# Patient Record
Sex: Male | Born: 1960 | Race: White | Hispanic: No | Marital: Married | Smoking: Never smoker
Health system: Southern US, Community
[De-identification: ages and names within clinical notes are randomized; demographics above are authoritative.]

## PROBLEM LIST (undated history)

## (undated) DIAGNOSIS — Z87442 Personal history of urinary calculi: Secondary | ICD-10-CM

## (undated) DIAGNOSIS — C7910 Secondary malignant neoplasm of unspecified urinary organs: Secondary | ICD-10-CM

## (undated) HISTORY — PX: CHOLECYSTECTOMY: SHX55

---

## 1997-04-07 HISTORY — PX: LAPAROSCOPIC CHOLECYSTECTOMY: SUR755

## 2017-12-18 ENCOUNTER — Ambulatory Visit (INDEPENDENT_AMBULATORY_CARE_PROVIDER_SITE_OTHER): Payer: 59 | Admitting: Family Medicine

## 2017-12-18 ENCOUNTER — Telehealth: Payer: Self-pay | Admitting: Family Medicine

## 2017-12-18 ENCOUNTER — Encounter: Payer: Self-pay | Admitting: Family Medicine

## 2017-12-18 ENCOUNTER — Other Ambulatory Visit: Payer: Self-pay

## 2017-12-18 VITALS — BP 132/92 | HR 93 | Temp 98.1°F | Ht 72.64 in | Wt 258.8 lb

## 2017-12-18 DIAGNOSIS — M79604 Pain in right leg: Secondary | ICD-10-CM | POA: Diagnosis not present

## 2017-12-18 DIAGNOSIS — B351 Tinea unguium: Secondary | ICD-10-CM | POA: Diagnosis not present

## 2017-12-18 DIAGNOSIS — B353 Tinea pedis: Secondary | ICD-10-CM | POA: Diagnosis not present

## 2017-12-18 MED ORDER — VALACYCLOVIR HCL 1 G PO TABS: 1000.0000 mg | ORAL_TABLET | Freq: Three times a day (TID) | ORAL | 0 refills | Status: DC

## 2017-12-18 MED ORDER — EFINACONAZOLE 10 % EX SOLN: CUTANEOUS | 3 refills | Status: DC

## 2017-12-18 MED ORDER — TRAMADOL HCL 50 MG PO TABS: 50.0000 mg | ORAL_TABLET | Freq: Four times a day (QID) | ORAL | 0 refills | Status: DC | PRN

## 2017-12-18 NOTE — Patient Instructions (Addendum)
Although it is less likely that your leg pain is coming from shingles, can start Valtrex for now.  Pain may be related to radicular pain from your back, or possibly from hip.  Okay to try over-the-counter ibuprofen or your diclofenac (if blood pressure is less than 140/90) but I did write for tramadol if needed for more severe pain.  Recheck in the next 1 week to 10 days if that is not improving, sooner if worse.  Elevated blood pressure today may be related to pain, but I would like to recheck that in the next 1 week.  Monitor your blood pressures at home and if those are running over 140/90, please return sooner.  Over-the-counter clotrimazole for athlete's foot as needed.  Jublia topical solution for the fungal infection in the toenails.  See information below.  If you would like to use Lamisil oral medication instead, we will need to check some blood work and monitor liver tests every 6 weeks.  Let me know if that is something you would like to do.  Please follow-up for a physical in the next month so we can review previous elevated blood sugars or other chronic medical issues.  Thank you for coming in today.  Return to the clinic or go to the nearest emergency room if any of your symptoms worsen or new symptoms occur.   Athlete's Foot Athlete's foot (tinea pedis) is a fungal infection of the skin on the feet. It often occurs on the skin that is between or underneath the toes. It can also occur on the soles of the feet. The infection can spread from person to person (is contagious). What are the causes? Athlete's foot is caused by a fungus. This fungus grows in warm, moist places. Most people get athlete's foot by sharing shower stalls, towels, and wet floors with someone who is infected. Not washing your feet or changing your socks often enough can contribute to athlete's foot. What increases the risk? This condition is more likely to develop in:  Men.  People who have a weak body defense  system (immune system).  People who have diabetes.  People who use public showers, such as at a gym.  People who wear heavy-duty shoes, such as Environmental manager.  Seasons with warm, humid weather.  What are the signs or symptoms? Symptoms of this condition include:  Itchy areas between the toes or on the soles of the feet.  White, flaky, or scaly areas between the toes or on the soles of the feet.  Very itchy small blisters between the toes or on the soles of the feet.  Small cuts on the skin. These cuts can become infected.  Thick or discolored toenails.  How is this diagnosed? This condition is diagnosed with a medical history and physical exam. Your health care provider may also take a skin or toenail sample to be examined. How is this treated? Treatment for this condition includes antifungal medicines. These may be applied as powders, ointments, or creams. In severe cases, an oral antifungal medicine may be given. Follow these instructions at home:  Apply or take over-the-counter and prescription medicines only as told by your health care provider.  Keep all follow-up visits as told by your health care provider. This is important.  Do not scratch your feet.  Keep your feet dry: ? Wear cotton or wool socks. Change your socks every day or if they become wet. ? Wear shoes that allow air to circulate, such as  sandals or canvas tennis shoes.  Wash and dry your feet: ? Every day or as told by your health care provider. ? After exercising. ? Including the area between your toes.  Do not share towels, nail clippers, or other personal items that touch your feet with others.  If you have diabetes, keep your blood sugar under control. How is this prevented?  Do not share towels.  Wear sandals in wet areas, such as locker rooms and shared showers.  Keep your feet dry: ? Wear cotton or wool socks. Change your socks every day or if they become wet. ? Wear  shoes that allow air to circulate, such as sandals or canvas tennis shoes.  Wash and dry your feet after exercising. Pay attention to the area between your toes. Contact a health care provider if:  You have a fever.  You have swelling, soreness, warmth, or redness in your foot.  You are not getting better with treatment.  Your symptoms get worse.  You have new symptoms. This information is not intended to replace advice given to you by your health care provider. Make sure you discuss any questions you have with your health care provider. Document Released: 03/21/2000 Document Revised: 08/30/2015 Document Reviewed: 09/25/2014 Elsevier Interactive Patient Education  2018 [employer].  Fungal Nail Infection Fungal nail infection is a common fungal infection of the toenails or fingernails. This condition affects toenails more often than fingernails. More than one nail may be infected. The condition can be passed from person to person (is contagious). What are the causes? This condition is caused by a fungus. Several types of funguses can cause the infection. These funguses are common in moist and warm areas. If your hands or feet come into contact with the fungus, it may get into a crack in your fingernail or toenail and cause the infection. What increases the risk? The following factors may make you more likely to develop this condition:  Being male.  Having diabetes.  Being of older age.  Living with someone who has the fungus.  Walking barefoot in areas where the fungus thrives, such as showers or locker rooms.  Having poor circulation.  Wearing shoes and socks that cause your feet to sweat.  Having athlete's foot.  Having a nail injury or history of a recent nail surgery.  Having psoriasis.  Having a weak body defense system (immune system).  What are the signs or symptoms? Symptoms of this condition include:  A pale spot on the nail.  Thickening of the  nail.  A nail that becomes yellow or brown.  A brittle or ragged nail edge.  A crumbling nail.  A nail that has lifted away from the nail bed.  How is this diagnosed? This condition is diagnosed with a physical exam. Your health care provider may take a scraping or clipping from your nail to test for the fungus. How is this treated? Mild infections do not need treatment. If you have significant nail changes, treatment may include:  Oral antifungal medicines. You may need to take the medicine for several weeks or several months, and you may not see the results for a long time. These medicines can cause side effects. Ask your health care provider what problems to watch for.  Antifungal nail polish and nail cream. These may be used along with oral antifungal medicines.  Laser treatment of the nail.  Surgery to remove the nail. This may be needed for the most severe infections.  Treatment takes  a long time, and the infection may come back. Follow these instructions at home: Medicines  Take or apply over-the-counter and prescription medicines only as told by your health care provider.  Ask your health care provider about using over-the-counter mentholated ointment on your nails. Lifestyle   Do not share personal items, such as towels or nail clippers.  Trim your nails often.  Wash and dry your hands and feet every day.  Wear absorbent socks, and change your socks frequently.  Wear shoes that allow air to circulate, such as sandals or canvas tennis shoes. Throw out old shoes.  Wear rubber gloves if you are working with your hands in wet areas.  Do not walk barefoot in shower rooms or locker rooms.  Do not use a nail salon that does not use clean instruments.  Do not use artificial nails. General instructions  Keep all follow-up visits as told by your health care provider. This is important.  Use antifungal foot powder on your feet and in your shoes. Contact a health  care provider if: Your infection is not getting better or it is getting worse after several months. This information is not intended to replace advice given to you by your health care provider. Make sure you discuss any questions you have with your health care provider. Document Released: 03/21/2000 Document Revised: 08/30/2015 Document Reviewed: 09/25/2014 Elsevier Interactive Patient Education  [employer].    If you have lab work done today you will be contacted with your lab results within the next 2 weeks.  If you have not heard from [place] then please contact us. The fastest way to get your results is to register for My Chart.   IF you received an x-ray today, you will receive an invoice from [facility]. Please contact [facility] at [phone] with questions or concerns regarding your invoice.   IF you received labwork today, you will receive an invoice from [facility]. Please contact [facility] at [phone] with questions or concerns regarding your invoice.   Our billing staff will not be able to assist you with questions regarding bills from these companies.  You will be contacted with the lab results as soon as they are available. The fastest way to get your results is to activate your My Chart account. Instructions are located on the last page of this paperwork. If you have not heard from [place] regarding the results in 2 weeks, please contact this office.

## 2017-12-18 NOTE — Telephone Encounter (Signed)
Copied from [place] [id number]. Topic: Quick Communication - Rx Refill/Question >> Dec 18, 2017 10:50 AM Margot Ables wrote: Medication: Efinaconazole 10 % SOLN  - pt states the pharmacy told him PA is required and they sent a fax to Dr. Carlota Raspberry. Pt said there is another medication that if his blood pressure goes down he is supposed to take. He states it was a pain medication but he didn't see anything ordered for pain except tramadol. Please advise.  If additional meds need to be sent use pharmacy:  [facility] [place], [place]

## 2017-12-18 NOTE — Progress Notes (Signed)
Subjective:  By signing my name below, I, Edward [name], attest that this documentation has been prepared under the direction and in the presence of Edward Ray, MD. Electronically Signed: [name], [place]. 12/18/2017 , 9:50 AM .  Patient was seen in Room 3 .   Patient ID: Edward [name], male    DOB: 1961-01-07, 58 y.o.   MRN: [id number] Chief Complaint  Patient presents with  . Pain    thinks he may have shingles, pain is raditaing from the right side of hip to the knee and very sensitive   HPI Edward [name] is a 57 y.o. male  Patient is here for right upper leg pain. He states he noticed skin hypersensitivity over his right upper leg initially. He felt some throbbing pain from his right groin, down into his right medial knee with throbbing pain over the front of his thigh. He denies any pain past his right knee. He mentions waking up the other night due to throbbing and pulsating pain. He's tried using the elliptical, with some improvement of his pain temporary. He states he didn't see a rash, but his wife noticed some bumps over the area. He has had some pain moving the right hip itself; flexion of his right leg he has some hip pain. He's received injections in his lumbar spine in the past around 2003-2004. He was recommended surgery at the time, but he preferred to have injection route first. He had MRI done with disc herniation at the time. He denies history of hernia. He's been taking ibuprofen 800 mg at night, but early this morning, took an additional diclofenac at 4:30 AM this morning due to too much pain.   He also mentions having toe fungus in his right foot. When hs exercised recently, he used OTC and other home remedies with some improvement. He states it's been present for about 35 years. Picture on phone showed irritated skin between the great and 2nd toe with some erythema.   Patient denies history of HTN in the past, though elevated BP on triage today. His BP usually  runs around 130/80s. He also mentions [name] sugars usually run around 100s-110s. He states having [name] work with Margit Banda on Oct 17th. He's also going to travel to [place] in Nov.   There are no active problems to display for this patient.  No past medical history on file.  Not on File Prior to Admission medications   Not on File   Social History   Socioeconomic History  . Marital status: Married    Spouse name: Not on file  . Number of children: Not on file  . Years of education: Not on file  . Highest education level: Not on file  Occupational History  . Not on file  Social Needs  . Financial resource strain: Not on file  . Food insecurity:    Worry: Not on file    Inability: Not on file  . Transportation needs:    Medical: Not on file    Non-medical: Not on file  Tobacco Use  . Smoking status: Never Smoker  . Smokeless tobacco: Never Used  Substance and Sexual Activity  . Alcohol use: Never    Frequency: Never  . Drug use: Never  . Sexual activity: Yes  Lifestyle  . Physical activity:    Days per week: Not on file    Minutes per session: Not on file  . Stress: Not on file  Relationships  . Social connections:  Talks on phone: Not on file    Gets together: Not on file    Attends religious service: Not on file    Active member of club or organization: Not on file    Attends meetings of clubs or organizations: Not on file    Relationship status: Not on file  . Intimate partner violence:    Fear of current or ex partner: Not on file    Emotionally abused: Not on file    Physically abused: Not on file    Forced sexual activity: Not on file  Other Topics Concern  . Not on file  Social History Narrative  . Not on file   Review of Systems  Constitutional: Negative for fatigue and unexpected weight change.  Eyes: Negative for visual disturbance.  Respiratory: Negative for cough, chest tightness and shortness of breath.   Cardiovascular: Negative for chest pain,  palpitations and leg swelling.  Gastrointestinal: Negative for abdominal pain and [name] in stool.  Musculoskeletal: Positive for arthralgias and myalgias. Negative for back pain and joint swelling.  Skin:       Skin hyperesthesia over right upper leg; toenail fungus of right foot  Neurological: Negative for dizziness, light-headedness and headaches.       Objective:   Physical Exam  Constitutional: He is oriented to person, place, and time. He appears well-developed and well-nourished. No distress.  HENT:  Head: Normocephalic and atraumatic.  Eyes: Pupils are equal, round, and reactive to light. EOM are normal.  Neck: Neck supple.  Cardiovascular: Normal rate.  Pulmonary/Chest: Effort normal. No respiratory distress.  Abdominal: Hernia confirmed negative in the right inguinal area and confirmed negative in the left inguinal area.  Genitourinary:  Genitourinary Comments: No obvious lymphadenopathy in the right inguinal fold, no appreciable hernia  Musculoskeletal: Normal range of motion.  Slight discomfort at the inguinal fold with active hip flexion, but pain free internal and external rotation of his right hip; L-spine non tender, L-spine pain free ROM  Lymphadenopathy: No inguinal adenopathy noted on the right side.  Neurological: He is alert and oriented to person, place, and time.  Skin: Skin is warm and dry.  Right foot: thickened toe nails of great, 2nd, 3rd and 5th toes, interdigital skin intact, slight skin scaling currently of dorsum foot, but no skin breakdown currently Right thigh and right leg there's no rash or skin changes  Psychiatric: He has a normal mood and affect. His behavior is normal.  Nursing note and vitals reviewed.   Vitals:   12/18/17 0914 12/18/17 0954  BP: (!) 160/90 (!) 132/92  Pulse: 93   Temp: 98.1 F (36.7 C)   TempSrc: Oral   SpO2: 95%   Weight: 258 lb 12.8 oz (117.4 kg)   Height: 6' 0.64" (1.845 m)        Assessment & Plan:  Edward [name] is a 57 y.o. male Right leg pain - Plan: valACYclovir (VALTREX) 1000 MG tablet, traMADol (ULTRAM) 50 MG tablet  -No rash seen, not specific to one dermatome, but possible dysesthesias or superficial pain.  Less likely shingles but in differential.  Pain with movement but then noted improved with some exercise. Iindicates possible musculoskeletal cause, including radicular pain from back versus right hip.  Did not have significant pain with hip testing.  No appreciable hernia.  -Initial treatment with Valtrex to cover for possible shingles, NSAID with ibuprofen or diclofenac at home as long as his [name] pressure remains below 140/90, and tramadol as needed for  more severe pain.  -Recheck in 1 week if not improving, sooner if worse.  Onychomycosis - Plan: Efinaconazole 10 % SOLN  - options discussed including topical vs oral and need for lft monitoring - chose Jublia. Timing of treatment and long term resolution discussed.   Tinea pedis of right foot  - improving with home treatment. Clotrimazole otc if needed, RTC precautions given.   Meds ordered this encounter  Medications  . Efinaconazole 10 % SOLN    Sig: Apply topically to affected nails QD for 48 weeks.    Dispense:  4 mL    Refill:  3  . valACYclovir (VALTREX) 1000 MG tablet    Sig: Take 1 tablet (1,000 mg total) by mouth 3 (three) times daily.    Dispense:  21 tablet    Refill:  0  . traMADol (ULTRAM) 50 MG tablet    Sig: Take 1 tablet (50 mg total) by mouth every 6 (six) hours as needed.    Dispense:  20 tablet    Refill:  0   Patient Instructions   Although it is less likely that your leg pain is coming from shingles, can start Valtrex for now.  Pain may be related to radicular pain from your back, or possibly from hip.  Okay to try over-the-counter ibuprofen or your diclofenac (if [name] pressure is less than 140/90) but I did write for tramadol if needed for more severe pain.  Recheck in the next 1 week to 10 days if  that is not improving, sooner if worse.  Elevated [name] pressure today may be related to pain, but I would like to recheck that in the next 1 week.  Monitor your [name] pressures at home and if those are running over 140/90, please return sooner.  Over-the-counter clotrimazole for athlete's foot as needed.  Jublia topical solution for the fungal infection in the toenails.  See information below.  If you would like to use Lamisil oral medication instead, we will need to check some [name] work and monitor liver tests every 6 weeks.  Let me know if that is something you would like to do.  Please follow-up for a physical in the next month so we can review previous elevated [name] sugars or other chronic medical issues.  Thank you for coming in today.  Return to the clinic or go to the nearest emergency room if any of your symptoms worsen or new symptoms occur.   Athlete's Foot Athlete's foot (tinea pedis) is a fungal infection of the skin on the feet. It often occurs on the skin that is between or underneath the toes. It can also occur on the soles of the feet. The infection can spread from person to person (is contagious). What are the causes? Athlete's foot is caused by a fungus. This fungus grows in warm, moist places. Most people get athlete's foot by sharing shower stalls, towels, and wet floors with someone who is infected. Not washing your feet or changing your socks often enough can contribute to athlete's foot. What increases the risk? This condition is more likely to develop in:  Men.  People who have a weak body defense system (immune system).  People who have diabetes.  People who use public showers, such as at a gym.  People who wear heavy-duty shoes, such as Environmental manager.  Seasons with warm, humid weather.  What are the signs or symptoms? Symptoms of this condition include:  Itchy areas between the toes or on  the soles of the feet.  White, flaky, or scaly  areas between the toes or on the soles of the feet.  Very itchy small blisters between the toes or on the soles of the feet.  Small cuts on the skin. These cuts can become infected.  Thick or discolored toenails.  How is this diagnosed? This condition is diagnosed with a medical history and physical exam. Your health care provider may also take a skin or toenail sample to be examined. How is this treated? Treatment for this condition includes antifungal medicines. These may be applied as powders, ointments, or creams. In severe cases, an oral antifungal medicine may be given. Follow these instructions at home:  Apply or take over-the-counter and prescription medicines only as told by your health care provider.  Keep all follow-up visits as told by your health care provider. This is important.  Do not scratch your feet.  Keep your feet dry: ? Wear cotton or wool socks. Change your socks every day or if they become wet. ? Wear shoes that allow air to circulate, such as sandals or canvas tennis shoes.  Wash and dry your feet: ? Every day or as told by your health care provider. ? After exercising. ? Including the area between your toes.  Do not share towels, nail clippers, or other personal items that touch your feet with others.  If you have diabetes, keep your [name] sugar under control. How is this prevented?  Do not share towels.  Wear sandals in wet areas, such as locker rooms and shared showers.  Keep your feet dry: ? Wear cotton or wool socks. Change your socks every day or if they become wet. ? Wear shoes that allow air to circulate, such as sandals or canvas tennis shoes.  Wash and dry your feet after exercising. Pay attention to the area between your toes. Contact a health care provider if:  You have a fever.  You have swelling, soreness, warmth, or redness in your foot.  You are not getting better with treatment.  Your symptoms get worse.  You have new  symptoms. This information is not intended to replace advice given to you by your health care provider. Make sure you discuss any questions you have with your health care provider. Document Released: 03/21/2000 Document Revised: 08/30/2015 Document Reviewed: 09/25/2014 Elsevier Interactive Patient Education  2018 [employer].  Fungal Nail Infection Fungal nail infection is a common fungal infection of the toenails or fingernails. This condition affects toenails more often than fingernails. More than one nail may be infected. The condition can be passed from person to person (is contagious). What are the causes? This condition is caused by a fungus. Several types of funguses can cause the infection. These funguses are common in moist and warm areas. If your hands or feet come into contact with the fungus, it may get into a crack in your fingernail or toenail and cause the infection. What increases the risk? The following factors may make you more likely to develop this condition:  Being male.  Having diabetes.  Being of older age.  Living with someone who has the fungus.  Walking barefoot in areas where the fungus thrives, such as showers or locker rooms.  Having poor circulation.  Wearing shoes and socks that cause your feet to sweat.  Having athlete's foot.  Having a nail injury or history of a recent nail surgery.  Having psoriasis.  Having a weak body defense system (immune system).  What  are the signs or symptoms? Symptoms of this condition include:  A pale spot on the nail.  Thickening of the nail.  A nail that becomes yellow or brown.  A brittle or ragged nail edge.  A crumbling nail.  A nail that has lifted away from the nail bed.  How is this diagnosed? This condition is diagnosed with a physical exam. Your health care provider may take a scraping or clipping from your nail to test for the fungus. How is this treated? Mild infections do not need  treatment. If you have significant nail changes, treatment may include:  Oral antifungal medicines. You may need to take the medicine for several weeks or several months, and you may not see the results for a long time. These medicines can cause side effects. Ask your health care provider what problems to watch for.  Antifungal nail polish and nail cream. These may be used along with oral antifungal medicines.  Laser treatment of the nail.  Surgery to remove the nail. This may be needed for the most severe infections.  Treatment takes a long time, and the infection may come back. Follow these instructions at home: Medicines  Take or apply over-the-counter and prescription medicines only as told by your health care provider.  Ask your health care provider about using over-the-counter mentholated ointment on your nails. Lifestyle   Do not share personal items, such as towels or nail clippers.  Trim your nails often.  Wash and dry your hands and feet every day.  Wear absorbent socks, and change your socks frequently.  Wear shoes that allow air to circulate, such as sandals or canvas tennis shoes. Throw out old shoes.  Wear rubber gloves if you are working with your hands in wet areas.  Do not walk barefoot in shower rooms or locker rooms.  Do not use a nail salon that does not use clean instruments.  Do not use artificial nails. General instructions  Keep all follow-up visits as told by your health care provider. This is important.  Use antifungal foot powder on your feet and in your shoes. Contact a health care provider if: Your infection is not getting better or it is getting worse after several months. This information is not intended to replace advice given to you by your health care provider. Make sure you discuss any questions you have with your health care provider. Document Released: 03/21/2000 Document Revised: 08/30/2015 Document Reviewed: 09/25/2014 Elsevier  Interactive Patient Education  [employer].    If you have lab work done today you will be contacted with your lab results within the next 2 weeks.  If you have not heard from [place] then please contact us. The fastest way to get your results is to register for My Chart.   IF you received an x-[name] today, you will receive an invoice from [facility]. Please contact [facility] at [phone] with questions or concerns regarding your invoice.   IF you received labwork today, you will receive an invoice from [facility]. Please contact [facility] at [phone] with questions or concerns regarding your invoice.   Our billing staff will not be able to assist you with questions regarding bills from these companies.  You will be contacted with the lab results as soon as they are available. The fastest way to get your results is to activate your My Chart account. Instructions are located on the last page of this paperwork. If you have not heard from [place] regarding the results  in 2 weeks, please contact this office.      I personally performed the services described in this documentation, which was scribed in my presence. The recorded information has been reviewed and considered for accuracy and completeness, addended by me as needed, and agree with information above.  Signed,   Edward Ray, MD [facility].  12/18/17 10:24 AM

## 2017-12-22 ENCOUNTER — Telehealth: Payer: Self-pay | Admitting: Family Medicine

## 2017-12-22 DIAGNOSIS — M79604 Pain in right leg: Secondary | ICD-10-CM

## 2017-12-22 NOTE — Telephone Encounter (Signed)
The medication Jublia was rejected by the insurance company, although it was rejected it still gives the option to attempt a PA. Well the insurance company is wanting to know if the patients treatment can be switched to a different formulary drug. I attached the drugs that are covered with the insurance company and placed in providers box at [place] at [address].

## 2017-12-23 NOTE — Telephone Encounter (Signed)
I will look for PA info for Jublia.  See patient instructions: "Okay to try over-the-counter ibuprofen or your diclofenac (if blood pressure is less than 140/90) but I did write for tramadol if needed for more severe pain"  Thanks.  -JG

## 2017-12-23 NOTE — Telephone Encounter (Signed)
Need PA and Dr. Carlota Raspberry please see note below about other medication for his BP.

## 2017-12-25 NOTE — Telephone Encounter (Signed)
Pt is calling in wanting to know if PA is available yet.

## 2017-12-26 ENCOUNTER — Encounter: Payer: Self-pay | Admitting: Family Medicine

## 2017-12-26 ENCOUNTER — Ambulatory Visit (INDEPENDENT_AMBULATORY_CARE_PROVIDER_SITE_OTHER): Payer: 59 | Admitting: Family Medicine

## 2017-12-26 ENCOUNTER — Other Ambulatory Visit: Payer: Self-pay

## 2017-12-26 VITALS — BP 134/83 | HR 64 | Temp 98.1°F | Resp 20 | Ht 72.84 in | Wt 254.6 lb

## 2017-12-26 DIAGNOSIS — B351 Tinea unguium: Secondary | ICD-10-CM | POA: Diagnosis not present

## 2017-12-26 DIAGNOSIS — R21 Rash and other nonspecific skin eruption: Secondary | ICD-10-CM | POA: Diagnosis not present

## 2017-12-26 DIAGNOSIS — Z131 Encounter for screening for diabetes mellitus: Secondary | ICD-10-CM

## 2017-12-26 DIAGNOSIS — Z8739 Personal history of other diseases of the musculoskeletal system and connective tissue: Secondary | ICD-10-CM | POA: Diagnosis not present

## 2017-12-26 MED ORDER — CICLOPIROX 8 % EX SOLN: Freq: Every day | CUTANEOUS | 6 refills | Status: DC

## 2017-12-26 MED ORDER — VALACYCLOVIR HCL 1 G PO TABS: 1000.0000 mg | ORAL_TABLET | Freq: Three times a day (TID) | ORAL | 0 refills | Status: DC

## 2017-12-26 NOTE — Telephone Encounter (Signed)
Seen in office, changed to [place].

## 2017-12-26 NOTE — Addendum Note (Signed)
Addended by: [name] R on: 12/26/2017 10:01 AM   Modules accepted: Orders

## 2017-12-26 NOTE — Progress Notes (Signed)
Subjective:  By signing my name below, I, Edward [name], attest that this documentation has been prepared under the direction and in the presence of [name], MD. Electronically Signed: [name], [place]. 12/26/2017 , 9:55 AM .  Patient was seen in Room 11 .   Patient ID: Edward [name], male    DOB: 1961/01/08, 57 y.o.   MRN: [id number] Chief Complaint  Patient presents with  . Leg Pain    f/u right leg- pt states lesions on right leg and now having right side pain for 4-5 days   HPI Edward [name] is a 57 y.o. male  Here for follow up of right leg pain. He was last seen 8 days ago; at that time, he had right upper leg pain, skin hypersensitivity, and pain down towards his knee. 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 Differential of lumbar spine cause of pain versus early shingles without rash at that time. He was started on Valtrex 1 g tid, and tramadol if needed for pain. Option of ibuprofen or diclofenac if lower home BP's.   Patient states he started Valtrex last Friday (Sept 13th), and then noticed rash temporarily resolved on Monday (Sept 16th). He had stopped Valtrex; but then noticed rash returning with a burning sensation Wednesday afternoon, so restarted Valtrex at that time (Sept 18th). Review of picture on phone showing home rash, small diffuse patches of erythema without apparent discrete vesicles. He noticed hypersensitivity over his right flank about 4-5 days ago, with improvement on ibuprofen. He denies any fever.    Onychomycosis - right foot Patient states Jublia was $600 as insurance wasn't covering it. He would like to try another prescription.    There are no active problems to display for this patient.  History reviewed. No pertinent past medical history. History reviewed. No pertinent surgical history. No Known Allergies Prior to Admission medications   Medication Sig Start Date End Date Taking? Authorizing Provider    Efinaconazole 10 % SOLN Apply topically to affected nails QD for 48 weeks. 12/18/17   [name], MD  Tamsulosin HCl (FLOMAX PO) Take by mouth.    [provider]  traMADol (ULTRAM) 50 MG tablet Take 1 tablet (50 mg total) by mouth every 6 (six) hours as needed. 12/18/17   [name], MD  valACYclovir (VALTREX) 1000 MG tablet Take 1 tablet (1,000 mg total) by mouth 3 (three) times daily. 12/18/17   [name], MD   Social History   Socioeconomic History  . Marital status: Married    Spouse name: Not on file  . Number of children: 2  . Years of education: Not on file  . Highest education level: Not on file  Occupational History  . Not on file  Social Needs  . Financial resource strain: Not on file  . Food insecurity:    Worry: Not on file    Inability: Not on file  . Transportation needs:    Medical: Not on file    Non-medical: Not on file  Tobacco Use  . Smoking status: Never Smoker  . Smokeless tobacco: Never Used  Substance and Sexual Activity  . Alcohol use: Never    Frequency: Never  . Drug use: Never  . Sexual activity: Yes  Lifestyle  . Physical activity:    Days per week: Not on file    Minutes per session: Not on file  . Stress: Not on file  Relationships  . Social connections:  Talks on phone: Not on file    Gets together: Not on file    Attends religious service: Not on file    Active member of club or organization: Not on file    Attends meetings of clubs or organizations: Not on file    Relationship status: Not on file  . Intimate partner violence:    Fear of current or ex partner: Not on file    Emotionally abused: Not on file    Physically abused: Not on file    Forced sexual activity: Not on file  Other Topics Concern  . Not on file  Social History Narrative  . Not on file   Review of Systems  Constitutional: Negative for fatigue, fever and unexpected weight change.  Eyes: Negative for visual disturbance.   Respiratory: Negative for cough, chest tightness and shortness of breath.   Cardiovascular: Negative for chest pain, palpitations and leg swelling.  Gastrointestinal: Negative for abdominal pain and [name] in stool.  Musculoskeletal: Positive for myalgias.  Skin: Positive for rash.       Hypersensitivity of right flank  Neurological: Negative for dizziness, light-headedness and headaches.       Objective:   Physical Exam  Constitutional: He is oriented to person, place, and time. He appears well-developed and well-nourished. No distress.  HENT:  Head: Normocephalic and atraumatic.  Mouth/Throat: No oral lesions.  Eyes: Pupils are equal, round, and reactive to light. EOM are normal.  Neck: Neck supple.  Cardiovascular: Normal rate.  Pulmonary/Chest: Effort normal. No respiratory distress.  Abdominal: Soft. There is no tenderness. There is no CVA tenderness.  Musculoskeletal: Normal range of motion.  Neurological: He is alert and oriented to person, place, and time.  Skin: Skin is warm and dry.  Slight sensitivity along right lateral trunk, reports previous sensitivity over right arm but now resolved, no rash over right lateral trunk Right leg: very faint erythematous patches along the medial knee, medial calf to the medial ankle, also extends to the anterior aspect of the lower leg and just barely to the lateral calf but spares majority of the lateral leg and posterior calf; no rash over the anterior and lateral thigh; no petechial lesions; no hand lesions  Psychiatric: He has a normal mood and affect. His behavior is normal.  Nursing note and vitals reviewed.   Vitals:   12/26/17 0919  BP: 134/83  Pulse: 64  Resp: 20  Temp: 98.1 F (36.7 C)  TempSrc: Oral  SpO2: 98%  Weight: 254 lb 9.6 oz (115.5 kg)  Height: 6' 0.84" (1.85 m)       Assessment & Plan:    Edward [name] is a 57 y.o. male Rash and nonspecific skin eruption - Plan: Basic metabolic panel, CBC, CANCELED:  CBC, CANCELED: Basic metabolic panel History of burning pain in leg Screening for diabetes mellitus - Plan: Basic metabolic panel, CANCELED: Basic metabolic panel  -With burning pain followed by rash, still suspicious for herpes zoster.  Rash of leg has shown some improvement, there is no systemic rash, no systemic symptoms of fever, headache or known tick exposure.  -Valtrex extended for an additional 5 days to provide full 7 days of treatment since last Wednesday when he had stopped temporarily.  -Check CBC and BMP to rule out leukocytosis or thrombocytopenia, but unlikely.  Additionally with use of ibuprofen frequently will check a baseline creatinine, screen for diabetes with this persistent/atypical presentation of possible zoster.  -RTC precautions discussed if any new or worsening  symptoms but as continue to improve at this time, follow-up as needed.  Onychomycosis - Plan: ciclopirox (PENLAC) 8 % solution  -Jublia cost prohibitive.  Try Penlac.  Timing of treatment discussed as well as potential for incomplete resolution.  Option of oral Lamisil with checking LFTs if he would prefer in the future.  Meds ordered this encounter  Medications  . ciclopirox (PENLAC) 8 % solution    Sig: Apply topically at bedtime. Apply over nail and surrounding skin. Apply daily over previous coat. After seven (7) days, may remove with alcohol and continue cycle. Use up to 48 weeks.    Dispense:  6.6 mL    Refill:  6   Patient Instructions   Rash and pain still suspicious for possible shingles.  Okay to use ibuprofen up to 800 mg every 8 hours for now.  I wrote for a new prescription of Valtrex to extend the course for another 5 days to provide a full 7 days treatment from Wednesday.  I will check a [name] count and basic metabolic panel, but as rash is improving, will hold on further treatment at this time.    Penlac was sent to your pharmacy for onychomycosis.  Return to the clinic or go to the nearest  emergency room if any of your symptoms worsen or new symptoms occur.    Shingles Shingles, which is also known as herpes zoster, is an infection that causes a painful skin rash and fluid-filled blisters. Shingles is not related to genital herpes, which is a sexually transmitted infection. Shingles only develops in people who:  Have had chickenpox.  Have received the chickenpox vaccine. (This is rare.)  What are the causes? Shingles is caused by varicella-zoster virus (VZV). This is the same virus that causes chickenpox. After exposure to VZV, the virus stays in the body in an inactive (dormant) state. Shingles develops if the virus reactivates. This can happen many years after the initial exposure to VZV. It is not known what causes this virus to reactivate. What increases the risk? People who have had chickenpox or received the chickenpox vaccine are at risk for shingles. Infection is more common in people who:  Are older than age 72.  Have a weakened defense (immune) system, such as those with HIV, AIDS, or cancer.  Are taking medicines that weaken the immune system, such as transplant medicines.  Are under great stress.  What are the signs or symptoms? Early symptoms of this condition include itching, tingling, and pain in an area on your skin. Pain may be described as burning, stabbing, or throbbing. A few days or weeks after symptoms start, a painful red rash appears, usually on one side of the body in a bandlike or beltlike pattern. The rash eventually turns into fluid-filled blisters that break open, scab over, and dry up in about 2-3 weeks. At any time during the infection, you may also develop:  A fever.  Chills.  A headache.  An upset stomach.  How is this diagnosed? This condition is diagnosed with a skin exam. Sometimes, skin or fluid samples are taken from the blisters before a diagnosis is made. These samples are examined under a microscope or sent to a lab for  testing. How is this treated? There is no specific cure for this condition. Your health care provider will probably prescribe medicines to help you manage pain, recover more quickly, and avoid long-term problems. Medicines may include:  Antiviral drugs.  Anti-inflammatory drugs.  Pain medicines.  If  the area involved is on your face, you may be referred to a specialist, such as an eye doctor (ophthalmologist) or an ear, nose, and throat (ENT) doctor to help you avoid eye problems, chronic pain, or disability. Follow these instructions at home: Medicines  Take medicines only as directed by your health care provider.  Apply an anti-itch or numbing cream to the affected area as directed by your health care provider. Blister and Rash Care  Take a cool bath or apply cool compresses to the area of the rash or blisters as directed by your health care provider. This may help with pain and itching.  Keep your rash covered with a loose bandage (dressing). Wear loose-fitting clothing to help ease the pain of material rubbing against the rash.  Keep your rash and blisters clean with mild soap and cool water or as directed by your health care provider.  Check your rash every day for signs of infection. These include redness, swelling, and pain that lasts or increases.  Do not pick your blisters.  Do not scratch your rash. General instructions  Rest as directed by your health care provider.  Keep all follow-up visits as directed by your health care provider. This is important.  Until your blisters scab over, your infection can cause chickenpox in people who have never had it or been vaccinated against it. To prevent this from happening, avoid contact with other people, especially: ? Babies. ? Pregnant women. ? Children who have eczema. ? Elderly people who have transplants. ? People who have chronic illnesses, such as leukemia or AIDS. Contact a health care provider if:  Your pain is  not relieved with prescribed medicines.  Your pain does not get better after the rash heals.  Your rash looks infected. Signs of infection include redness, swelling, and pain that lasts or increases. Get help right away if:  The rash is on your face or nose.  You have facial pain, pain around your eye area, or loss of feeling on one side of your face.  You have ear pain or you have ringing in your ear.  You have loss of taste.  Your condition gets worse. This information is not intended to replace advice given to you by your health care provider. Make sure you discuss any questions you have with your health care provider. Document Released: 03/24/2005 Document Revised: 11/18/2015 Document Reviewed: 02/02/2014 Elsevier Interactive Patient Education  [employer].   If you have lab work done today you will be contacted with your lab results within the next 2 weeks.  If you have not heard from [place] then please contact us. The fastest way to get your results is to register for My Chart.   IF you received an x-ray today, you will receive an invoice from [facility]. Please contact [facility] at [phone] with questions or concerns regarding your invoice.   IF you received labwork today, you will receive an invoice from [facility]. Please contact [facility] at [phone] with questions or concerns regarding your invoice.   Our billing staff will not be able to assist you with questions regarding bills from these companies.  You will be contacted with the lab results as soon as they are available. The fastest way to get your results is to activate your My Chart account. Instructions are located on the last page of this paperwork. If you have not heard from [place] regarding the results in 2 weeks, please contact this office.  I personally performed the services described in this documentation, which was scribed in my presence. The recorded information has been  reviewed and considered for accuracy and completeness, addended by me as needed, and agree with information above.  Signed,   [name], MD [facility].  12/26/17 10:23 AM

## 2017-12-26 NOTE — Patient Instructions (Addendum)
Rash and pain still suspicious for possible shingles.  Okay to use ibuprofen up to 800 mg every 8 hours for now.  I wrote for a new prescription of Valtrex to extend the course for another 5 days to provide a full 7 days treatment from Wednesday.  I will check a blood count and basic metabolic panel, but as rash is improving, will hold on further treatment at this time.    Penlac was sent to your pharmacy for onychomycosis.  Return to the clinic or go to the nearest emergency room if any of your symptoms worsen or new symptoms occur.    Shingles Shingles, which is also known as herpes zoster, is an infection that causes a painful skin rash and fluid-filled blisters. Shingles is not related to genital herpes, which is a sexually transmitted infection. Shingles only develops in people who:  Have had chickenpox.  Have received the chickenpox vaccine. (This is rare.)  What are the causes? Shingles is caused by varicella-zoster virus (VZV). This is the same virus that causes chickenpox. After exposure to VZV, the virus stays in the body in an inactive (dormant) state. Shingles develops if the virus reactivates. This can happen many years after the initial exposure to VZV. It is not known what causes this virus to reactivate. What increases the risk? People who have had chickenpox or received the chickenpox vaccine are at risk for shingles. Infection is more common in people who:  Are older than age 71.  Have a weakened defense (immune) system, such as those with HIV, AIDS, or cancer.  Are taking medicines that weaken the immune system, such as transplant medicines.  Are under great stress.  What are the signs or symptoms? Early symptoms of this condition include itching, tingling, and pain in an area on your skin. Pain may be described as burning, stabbing, or throbbing. A few days or weeks after symptoms start, a painful red rash appears, usually on one side of the body in a bandlike or  beltlike pattern. The rash eventually turns into fluid-filled blisters that break open, scab over, and dry up in about 2-3 weeks. At any time during the infection, you may also develop:  A fever.  Chills.  A headache.  An upset stomach.  How is this diagnosed? This condition is diagnosed with a skin exam. Sometimes, skin or fluid samples are taken from the blisters before a diagnosis is made. These samples are examined under a microscope or sent to a lab for testing. How is this treated? There is no specific cure for this condition. Your health care provider will probably prescribe medicines to help you manage pain, recover more quickly, and avoid long-term problems. Medicines may include:  Antiviral drugs.  Anti-inflammatory drugs.  Pain medicines.  If the area involved is on your face, you may be referred to a specialist, such as an eye doctor (ophthalmologist) or an ear, nose, and throat (ENT) doctor to help you avoid eye problems, chronic pain, or disability. Follow these instructions at home: Medicines  Take medicines only as directed by your health care provider.  Apply an anti-itch or numbing cream to the affected area as directed by your health care provider. Blister and Rash Care  Take a cool bath or apply cool compresses to the area of the rash or blisters as directed by your health care provider. This may help with pain and itching.  Keep your rash covered with a loose bandage (dressing). Wear loose-fitting clothing to help ease  the pain of material rubbing against the rash.  Keep your rash and blisters clean with mild soap and cool water or as directed by your health care provider.  Check your rash every day for signs of infection. These include redness, swelling, and pain that lasts or increases.  Do not pick your blisters.  Do not scratch your rash. General instructions  Rest as directed by your health care provider.  Keep all follow-up visits as directed  by your health care provider. This is important.  Until your blisters scab over, your infection can cause chickenpox in people who have never had it or been vaccinated against it. To prevent this from happening, avoid contact with other people, especially: ? Babies. ? Pregnant women. ? Children who have eczema. ? Elderly people who have transplants. ? People who have chronic illnesses, such as leukemia or AIDS. Contact a health care provider if:  Your pain is not relieved with prescribed medicines.  Your pain does not get better after the rash heals.  Your rash looks infected. Signs of infection include redness, swelling, and pain that lasts or increases. Get help right away if:  The rash is on your face or nose.  You have facial pain, pain around your eye area, or loss of feeling on one side of your face.  You have ear pain or you have ringing in your ear.  You have loss of taste.  Your condition gets worse. This information is not intended to replace advice given to you by your health care provider. Make sure you discuss any questions you have with your health care provider. Document Released: 03/24/2005 Document Revised: 11/18/2015 Document Reviewed: 02/02/2014 Elsevier Interactive Patient Education  [employer].   If you have lab work done today you will be contacted with your lab results within the next 2 weeks.  If you have not heard from [place] then please contact us. The fastest way to get your results is to register for My Chart.   IF you received an x-ray today, you will receive an invoice from [facility]. Please contact [facility] at [phone] with questions or concerns regarding your invoice.   IF you received labwork today, you will receive an invoice from [facility]. Please contact [facility] at [phone] with questions or concerns regarding your invoice.   Our billing staff will not be able to assist you with questions regarding bills from  these companies.  You will be contacted with the lab results as soon as they are available. The fastest way to get your results is to activate your My Chart account. Instructions are located on the last page of this paperwork. If you have not heard from [place] regarding the results in 2 weeks, please contact this office.

## 2017-12-27 ENCOUNTER — Other Ambulatory Visit: Payer: Self-pay | Admitting: Family Medicine

## 2017-12-27 DIAGNOSIS — M79604 Pain in right leg: Secondary | ICD-10-CM

## 2017-12-27 LAB — BASIC METABOLIC PANEL
BUN: 17 mg/dL (ref 7–25)
CO2: 28 mmol/L (ref 20–32)
CREATININE: 1.21 mg/dL (ref 0.70–1.33)
Calcium: 9.7 mg/dL (ref 8.6–10.3)
Chloride: 106 mmol/L (ref 98–110)
GLUCOSE: 114 mg/dL — AB (ref 65–99)
Potassium: 4.5 mmol/L (ref 3.5–5.3)
SODIUM: 141 mmol/L (ref 135–146)

## 2017-12-27 LAB — CBC
HEMATOCRIT: 45 % (ref 38.5–50.0)
Hemoglobin: 15.1 g/dL (ref 13.2–17.1)
MCH: 27.9 pg (ref 27.0–33.0)
MCHC: 33.6 g/dL (ref 32.0–36.0)
MCV: 83.2 fL (ref 80.0–100.0)
MPV: 13 fL — AB (ref 7.5–12.5)
Platelets: 157 10*3/uL (ref 140–400)
RBC: 5.41 10*6/uL (ref 4.20–5.80)
RDW: 12.8 % (ref 11.0–15.0)
WBC: 4.4 10*3/uL (ref 3.8–10.8)

## 2017-12-28 ENCOUNTER — Ambulatory Visit: Payer: 59 | Admitting: Family Medicine

## 2017-12-28 MED ORDER — VALACYCLOVIR HCL 1 G PO TABS: 1000.0000 mg | ORAL_TABLET | Freq: Three times a day (TID) | ORAL | 0 refills | Status: DC

## 2017-12-28 NOTE — Telephone Encounter (Signed)
I have called the medication of the Valtrex into the pharmacy in [facility] [address] per pt request. The pharmacy stated that the medication can be picked up today, however the insurance will not pay for it until the 25th. I have called pt wife and relayed the information and she stated understanding. Lowell Guitar  Kittie Plater

## 2017-12-28 NOTE — Addendum Note (Signed)
Addended by: Matilde Sprang on: 12/28/2017 01:52 PM   Modules accepted: Orders

## 2017-12-28 NOTE — Telephone Encounter (Signed)
Pt called in and states that it appears shingles medication was sent to the mail order pharmacy instead of to the local [facility].  Please send to: [facility], [place] [phone] (Phone) [phone] (Fax)

## 2017-12-28 NOTE — Telephone Encounter (Signed)
Detailed message left

## 2017-12-30 ENCOUNTER — Telehealth: Payer: Self-pay | Admitting: Family Medicine

## 2017-12-30 NOTE — Telephone Encounter (Signed)
Copied from [place] [id number]. Topic: General - Other >> Dec 30, 2017 12:21 PM Leward Quan A wrote: Reason for CRM: Patient would like to know if Dr Nyoka Cowden would send an Rx for Neurontin 300 mg to pharmacy states that he is in pain from the shingles and that would help. Also would like a call back to know when should he follow up with Dr Nyoka Cowden from previous visit.

## 2017-12-31 MED ORDER — GABAPENTIN 300 MG PO CAPS: 300.0000 mg | ORAL_CAPSULE | Freq: Every day | ORAL | 1 refills | Status: DC

## 2017-12-31 NOTE — Telephone Encounter (Signed)
Patient was given results and he verbalized understanding.

## 2017-12-31 NOTE — Telephone Encounter (Signed)
Follow up as needed for pain in leg, but if not improving in the next 1 to 2 weeks would recommend recheck at that time.  I did send gabapentin to his pharmacy, start with that at bedtime, then if needed at other times during the day, can increase to twice daily in 1 week if tolerated.  Let me know if there are questions.

## 2018-01-27 ENCOUNTER — Other Ambulatory Visit: Payer: Self-pay | Admitting: Family Medicine

## 2020-02-06 DIAGNOSIS — R29898 Other symptoms and signs involving the musculoskeletal system: Secondary | ICD-10-CM

## 2020-02-06 DIAGNOSIS — R600 Localized edema: Secondary | ICD-10-CM

## 2020-02-06 HISTORY — DX: Localized edema: R60.0

## 2020-02-06 HISTORY — DX: Other symptoms and signs involving the musculoskeletal system: R29.898

## 2020-02-17 ENCOUNTER — Emergency Department (HOSPITAL_COMMUNITY): Payer: 59

## 2020-02-17 ENCOUNTER — Encounter: Payer: Self-pay | Admitting: Family Medicine

## 2020-02-17 ENCOUNTER — Other Ambulatory Visit: Payer: Self-pay

## 2020-02-17 ENCOUNTER — Emergency Department (HOSPITAL_COMMUNITY)
Admission: EM | Admit: 2020-02-17 | Discharge: 2020-02-18 | Disposition: A | Discharge status: Home / Self Care | Payer: 59 | Attending: Emergency Medicine | Admitting: Emergency Medicine

## 2020-02-17 ENCOUNTER — Encounter (HOSPITAL_COMMUNITY): Payer: Self-pay | Admitting: Emergency Medicine

## 2020-02-17 ENCOUNTER — Ambulatory Visit (INDEPENDENT_AMBULATORY_CARE_PROVIDER_SITE_OTHER): Payer: 59 | Admitting: Family Medicine

## 2020-02-17 VITALS — BP 144/90 | HR 78 | Temp 99.2°F | Ht 72.0 in | Wt 253.0 lb

## 2020-02-17 DIAGNOSIS — R531 Weakness: Secondary | ICD-10-CM | POA: Insufficient documentation

## 2020-02-17 DIAGNOSIS — R200 Anesthesia of skin: Secondary | ICD-10-CM

## 2020-02-17 DIAGNOSIS — R222 Localized swelling, mass and lump, trunk: Secondary | ICD-10-CM | POA: Diagnosis not present

## 2020-02-17 DIAGNOSIS — N5089 Other specified disorders of the male genital organs: Secondary | ICD-10-CM

## 2020-02-17 DIAGNOSIS — N3289 Other specified disorders of bladder: Secondary | ICD-10-CM

## 2020-02-17 DIAGNOSIS — D494 Neoplasm of unspecified behavior of bladder: Secondary | ICD-10-CM | POA: Insufficient documentation

## 2020-02-17 DIAGNOSIS — R5381 Other malaise: Secondary | ICD-10-CM

## 2020-02-17 DIAGNOSIS — R198 Other specified symptoms and signs involving the digestive system and abdomen: Secondary | ICD-10-CM

## 2020-02-17 DIAGNOSIS — M62559 Muscle wasting and atrophy, not elsewhere classified, unspecified thigh: Secondary | ICD-10-CM

## 2020-02-17 DIAGNOSIS — M25452 Effusion, left hip: Secondary | ICD-10-CM | POA: Diagnosis not present

## 2020-02-17 DIAGNOSIS — R1900 Intra-abdominal and pelvic swelling, mass and lump, unspecified site: Secondary | ICD-10-CM

## 2020-02-17 DIAGNOSIS — G5722 Lesion of femoral nerve, left lower limb: Secondary | ICD-10-CM

## 2020-02-17 LAB — CBC WITH DIFFERENTIAL/PLATELET
Abs Immature Granulocytes: 0.03 10*3/uL (ref 0.00–0.07)
Basophils Absolute: 0 10*3/uL (ref 0.0–0.1)
Basophils Relative: 0 %
Eosinophils Absolute: 0.1 10*3/uL (ref 0.0–0.5)
Eosinophils Relative: 1 %
HCT: 38 % — ABNORMAL LOW (ref 39.0–52.0)
Hemoglobin: 12.4 g/dL — ABNORMAL LOW (ref 13.0–17.0)
Immature Granulocytes: 0 %
Lymphocytes Relative: 19 %
Lymphs Abs: 1.6 10*3/uL (ref 0.7–4.0)
MCH: 27 pg (ref 26.0–34.0)
MCHC: 32.6 g/dL (ref 30.0–36.0)
MCV: 82.6 fL (ref 80.0–100.0)
Monocytes Absolute: 0.7 10*3/uL (ref 0.1–1.0)
Monocytes Relative: 9 %
Neutro Abs: 5.9 10*3/uL (ref 1.7–7.7)
Neutrophils Relative %: 71 %
Platelets: 207 10*3/uL (ref 150–400)
RBC: 4.6 MIL/uL (ref 4.22–5.81)
RDW: 12.6 % (ref 11.5–15.5)
WBC: 8.4 10*3/uL (ref 4.0–10.5)
nRBC: 0 % (ref 0.0–0.2)

## 2020-02-17 LAB — POCT CBC
Granulocyte percent: 72.1 %G (ref 37–80)
HCT, POC: 37.2 % (ref 29–41)
Hemoglobin: 12.5 g/dL (ref 11–14.6)
Lymph, poc: 1.3 (ref 0.6–3.4)
MCH, POC: 27.8 pg (ref 27–31.2)
MCHC: 33.6 g/dL (ref 31.8–35.4)
MCV: 82.6 fL (ref 76–111)
MID (cbc): 0.4 (ref 0–0.9)
MPV: 9.5 fL (ref 0–99.8)
POC Granulocyte: 4.4 (ref 2–6.9)
POC LYMPH PERCENT: 20.8 %L (ref 10–50)
POC MID %: 7.1 %M (ref 0–12)
Platelet Count, POC: 192 10*3/uL (ref 142–424)
RBC: 4.51 M/uL — AB (ref 4.69–6.13)
RDW, POC: 13 %
WBC: 6.1 10*3/uL (ref 4.6–10.2)

## 2020-02-17 LAB — COMPREHENSIVE METABOLIC PANEL
ALT: 17 U/L (ref 0–44)
AST: 23 U/L (ref 15–41)
Albumin: 3.7 g/dL (ref 3.5–5.0)
Alkaline Phosphatase: 60 U/L (ref 38–126)
Anion gap: 10 (ref 5–15)
BUN: 12 mg/dL (ref 6–20)
CO2: 27 mmol/L (ref 22–32)
Calcium: 9.2 mg/dL (ref 8.9–10.3)
Chloride: 103 mmol/L (ref 98–111)
Creatinine, Ser: 1.04 mg/dL (ref 0.61–1.24)
GFR, Estimated: >60 mL/min (ref >60)
Glucose, Bld: 120 mg/dL — ABNORMAL HIGH (ref 70–99)
Potassium: 3.6 mmol/L (ref 3.5–5.1)
Sodium: 140 mmol/L (ref 135–145)
Total Bilirubin: 0.6 mg/dL (ref 0.3–1.2)
Total Protein: 7.7 g/dL (ref 6.5–8.1)

## 2020-02-17 LAB — URINALYSIS, ROUTINE W REFLEX MICROSCOPIC
Bilirubin Urine: NEGATIVE
Glucose, UA: NEGATIVE mg/dL
Hgb urine dipstick: NEGATIVE
Ketones, ur: NEGATIVE mg/dL
Leukocytes,Ua: NEGATIVE
Nitrite: NEGATIVE
Protein, ur: NEGATIVE mg/dL
Specific Gravity, Urine: 1.005 (ref 1.005–1.030)
pH: 6 (ref 5.0–8.0)

## 2020-02-17 LAB — GLUCOSE, POCT (MANUAL RESULT ENTRY): POC Glucose: 102 mg/dl — AB (ref 70–99)

## 2020-02-17 MED ORDER — IOHEXOL 300 MG/ML  SOLN
100.0000 mL | Freq: Once | INTRAMUSCULAR | Status: AC | PRN
  Administered 2020-02-17: 100 mL via INTRAVENOUS

## 2020-02-17 MED ORDER — FUROSEMIDE 10 MG/ML IJ SOLN
40.0000 mg | Freq: Once | INTRAMUSCULAR | Status: AC
  Administered 2020-02-17: 40 mg via INTRAVENOUS
  Filled 2020-02-17: qty 4

## 2020-02-17 NOTE — Patient Instructions (Addendum)
I do recommend further evaluation through the emergency room tonight as you may need some advanced imaging including possible ultrasound or CT of left groin area. I will contact emergency room to make sure they would be able to provide that care if needed tonight and will let you know.   If you have lab work done today you will be contacted with your lab results within the next 2 weeks.  If you have not heard from [place] then please contact us. The fastest way to get your results is to register for My Chart.   IF you received an x-ray today, you will receive an invoice from [facility]. Please contact [facility] at [phone] with questions or concerns regarding your invoice.   IF you received labwork today, you will receive an invoice from [place]. Please contact [facility] at [phone] with questions or concerns regarding your invoice.   Our billing staff will not be able to assist you with questions regarding bills from these companies.  You will be contacted with the lab results as soon as they are available. The fastest way to get your results is to activate your My Chart account. Instructions are located on the last page of this paperwork. If you have not heard from [place] regarding the results in 2 weeks, please contact this office.

## 2020-02-17 NOTE — Progress Notes (Signed)
Subjective:  Patient ID: Edward [name], male    DOB: June 06, 1960  Age: 59 y.o. MRN: [id number]  CC:  Chief Complaint  Patient presents with  . Edema    in pt's groin. pt states it started in the L side of his groin. with pain goint into his thigh. pt reports that the pain is like a throbings sinsation. pt reports the issue seems to be following his femeral artery. Pt states this started shortly after he started rowing for exercise with a tight exercise belt. PT needs labs if any sent to [facility].   HPI Edward [name] presents for   Left groin pain, swelling. Started after rowing for exercise with a tight exercise belt. No injury.  Soreness started 5-6 weeks ago, but may have had swelling in area prior - above the groin. No prior hernia. Slight throbbing pain that would move to thigh. Less sore now, but still some swelling above groin and has noticed swelling into medial thigh and numbness in same area past 2 weeks after driving for 5 hrs in car. Swelling seemed to move to suprapelvic area and swelling in scrotum past week. Min numbness medial left leg.  Swelling in scrotum, above groin improve some overnight then recur. Same past 2 days.  Feels weak in left quad past 10 days. Feels like patellar reflex decreased past week. No prior eval for these symptoms. Femoral glide and quad exercises seemed to help weakness.  concerned about femoral A/V/N issue based on his reading.  Subjective all over weakness, cold feeling in fingers and toes - past few months. Better if taking ibuprofen or tylenol. Fasting on Thursdays - usually feels ok, but past 6 weeks feels weak on day of fast.  He checked his HGb 3 weeks ago and it was normal.   No blood in stool or dark stools.   dtr Edward [name] at [facility] starting podiatry.  Other dtr playing soccer at [employer] - invited to be on [employer] team.    History There are no problems to display for this patient.  No past medical history on file. No past surgical  history on file. No Known Allergies Prior to Admission medications   Medication Sig Start Date End Date Taking? Authorizing Provider  Tamsulosin HCl (FLOMAX PO) Take by mouth.   Yes [provider]  ciclopirox (PENLAC) 8 % solution Apply topically at bedtime. Apply over nail and surrounding skin. Apply daily over previous coat. After seven (7) days, may remove with alcohol and continue cycle. Use up to 48 weeks. Patient not taking: Reported on 02/17/2020 12/26/17   [name], MD  Efinaconazole 10 % SOLN Apply topically to affected nails QD for 48 weeks. Patient not taking: Reported on 02/17/2020 12/18/17   [name], MD  gabapentin (NEURONTIN) 300 MG capsule Take 1 capsule (300 mg total) by mouth at bedtime. Can increase to BID if tolerated in 1 week. Patient not taking: Reported on 02/17/2020 12/31/17   [name], MD  traMADol (ULTRAM) 50 MG tablet Take 1 tablet (50 mg total) by mouth every 6 (six) hours as needed. Patient not taking: Reported on 02/17/2020 12/18/17   [name], MD   Social History   Socioeconomic History  . Marital status: Married    Spouse name: Not on file  . Number of children: 2  . Years of education: Not on file  . Highest education level: Not on file  Occupational History  . Not on file  Tobacco Use  . Smoking status: Never Smoker  . Smokeless tobacco: Never Used  Vaping Use  . Vaping Use: Never used  Substance and Sexual Activity  . Alcohol use: Never  . Drug use: Never  . Sexual activity: Yes  Other Topics Concern  . Not on file  Social History Narrative  . Not on file   Social Determinants of Health   Financial Resource Strain:   . Difficulty of Paying Living Expenses: Not on file  Food Insecurity:   . Worried About Charity fundraiser in the Last Year: Not on file  . Ran Out of Food in the Last Year: Not on file  Transportation Needs:   . Lack of Transportation (Medical): Not on file  . Lack of  Transportation (Non-Medical): Not on file  Physical Activity:   . Days of Exercise per Week: Not on file  . Minutes of Exercise per Session: Not on file  Stress:   . Feeling of Stress : Not on file  Social Connections:   . Frequency of Communication with Friends and Family: Not on file  . Frequency of Social Gatherings with Friends and Family: Not on file  . Attends Religious Services: Not on file  . Active Member of Clubs or Organizations: Not on file  . Attends Archivist Meetings: Not on file  . Marital Status: Not on file  Intimate Partner Violence:   . Fear of Current or Ex-Partner: Not on file  . Emotionally Abused: Not on file  . Physically Abused: Not on file  . Sexually Abused: Not on file    Review of Systems  Per HPI.   Objective:   Vitals:   02/17/20 1435 02/17/20 1456  BP: (!) 175/95 (!) 144/90  Pulse: 78   Temp: 99.2 F (37.3 C)   TempSrc: Temporal   SpO2: 100%   Weight: 253 lb (114.8 kg)   Height: 6' (1.829 m)      Physical Exam Constitutional:      General: He is not in acute distress.    Appearance: He is well-developed.  HENT:     Head: Normocephalic and atraumatic.  Cardiovascular:     Rate and Rhythm: Normal rate.  Pulmonary:     Effort: Pulmonary effort is normal.  Abdominal:    Genitourinary:   Musculoskeletal:       Legs:  Skin:    General: Skin is warm and dry.     Findings: No bruising or rash.  Neurological:     Mental Status: He is alert and oriented to person, place, and time.     Sensory: Sensation is intact.     Deep Tendon Reflexes:     Reflex Scores:      Patellar reflexes are 1+ on the right side and 0 on the left side.      Achilles reflexes are 2+ on the right side and 2+ on the left side.    Comments: Unable to obtain left patellar reflex.      Results for orders placed or performed in visit on 02/17/20  POCT CBC  Result Value Ref Range   WBC 6.1 4.6 - 10.2 K/uL   Lymph, poc 1.3 0.6 - 3.4   POC  LYMPH PERCENT 20.8 10 - 50 %L   MID (cbc) 0.4 0 - 0.9   POC MID % 7.1 0 - 12 %M   POC Granulocyte 4.4 2 - 6.9   Granulocyte percent 72.1 37 - 80 %G  RBC 4.51 (A) 4.69 - 6.13 M/uL   Hemoglobin 12.5 11 - 14.6 g/dL   HCT, POC 37.2 29 - 41 %   MCV 82.6 76 - 111 fL   MCH, POC 27.8 27 - 31.2 pg   MCHC 33.6 31.8 - 35.4 g/dL   RDW, POC 13.0 %   Platelet Count, POC 192 142 - 424 K/uL   MPV 9.5 0 - 99.8 fL  POCT glucose (manual entry)  Result Value Ref Range   POC Glucose 102 (A) 70 - 99 mg/dl    Over 45 minutes spent during visit, greater than 50% counseling and assimilation of information, chart review, and discussion of plan.   Assessment & Plan:  Edward [name] is a 59 y.o. male . Edema of male genital organs  Suprapubic fullness  Left leg numbness  Swelling of joint of pelvic region or thigh, left  Malaise - Plan: POCT CBC, POCT glucose (manual entry)  Atrophy of quadriceps femoris muscle   L groin/femoral triangle area swelling by history with initial pain after use of exercise belt and row machine. Intermittent thigh pain, then numbness suspicious for femoral artery impingement with progression to quad weakness and now atrophy. Proceeded to suprapubic swelling/fullness and scrotal/penile swelling with some induration, slight erythema of scrotal skin. Reports intermittent improvement of this swelling overnight, but doesnot resolve.  Concern for possible traumatic injury to left groin initially but with progressive suprapubic fullness, and scrotal/penile swelling, venous or lymphatic injury possible as well. Further eval/ imaging needed. Briefly discussed with EDP, will have pt seen in [facility]  ER tonight for further eval and potential imaging depending on ER eval. Plan discussed with pt and understanding expressed.   No orders of the defined types were placed in this encounter.  Patient Instructions   I do recommend further evaluation through the emergency room tonight as you  may need some advanced imaging including possible ultrasound or CT of left groin area. I will contact emergency room to make sure they would be able to provide that care if needed tonight and will let you know.   If you have lab work done today you will be contacted with your lab results within the next 2 weeks.  If you have not heard from [place] then please contact us. The fastest way to get your results is to register for My Chart.   IF you received an x-ray today, you will receive an invoice from [facility]. Please contact [facility] at [phone] with questions or concerns regarding your invoice.   IF you received labwork today, you will receive an invoice from [facility]. Please contact [facility] at [phone] with questions or concerns regarding your invoice.   Our billing staff will not be able to assist you with questions regarding bills from these companies.  You will be contacted with the lab results as soon as they are available. The fastest way to get your results is to activate your My Chart account. Instructions are located on the last page of this paperwork. If you have not heard from [place] regarding the results in 2 weeks, please contact this office.         Signed, [name], MD [facility]

## 2020-02-17 NOTE — ED Triage Notes (Signed)
Patient reports persistent left thigh/scrotal swelling for 10 days , denies injury , no fever or chills .

## 2020-02-17 NOTE — ED Provider Notes (Signed)
[facility] EMERGENCY DEPARTMENT Provider Note   CSN: [id number] Arrival date & time: 02/17/20  1913     History Chief Complaint  Patient presents with  . Leg Swelling    Edward [name] is a 59 y.o. male otherwise healthy here presenting with left leg weakness.  Patient states that he has been having left leg weakness that has been going on for the last month or so.  He states that he went a long hallway about 2 weeks ago and it got worse.  He has been growing more and had a tight belt.  He thought that was pinching on his femoral nerve.  He states that for the last 10 days or so his left quad appears weak.  He also noticed swelling in his scrotum as well.  Patient is a retired Industrial/product designer and was concerned for possible nerve impingement of the left femoral nerve.  Patient went to ER earlier today and was noted to have no patellar reflex.  Patient was sent to the ED for further evaluation.   The history is provided by the patient.       History reviewed. No pertinent past medical history.  There are no problems to display for this patient.   Past Surgical History:  Procedure Laterality Date  . CHOLECYSTECTOMY         Family History  Problem Relation Age of Onset  . Hypertension Mother   . Heart disease Father   . Diabetes Father     Social History   Tobacco Use  . Smoking status: Never Smoker  . Smokeless tobacco: Never Used  Vaping Use  . Vaping Use: Never used  Substance Use Topics  . Alcohol use: Never  . Drug use: Never    Home Medications Prior to Admission medications   Medication Sig Start Date End Date Taking? Authorizing Provider  ciclopirox (PENLAC) 8 % solution Apply topically at bedtime. Apply over nail and surrounding skin. Apply daily over previous coat. After seven (7) days, may remove with alcohol and continue cycle. Use up to 48 weeks. Patient not taking: Reported on 02/17/2020 12/26/17   [name], MD  Efinaconazole  10 % SOLN Apply topically to affected nails QD for 48 weeks. Patient not taking: Reported on 02/17/2020 12/18/17   [name], MD  gabapentin (NEURONTIN) 300 MG capsule Take 1 capsule (300 mg total) by mouth at bedtime. Can increase to BID if tolerated in 1 week. Patient not taking: Reported on 02/17/2020 12/31/17   [name], MD  Tamsulosin HCl (FLOMAX PO) Take by mouth.    [provider]  traMADol (ULTRAM) 50 MG tablet Take 1 tablet (50 mg total) by mouth every 6 (six) hours as needed. Patient not taking: Reported on 02/17/2020 12/18/17   [name], MD    Allergies    Patient has no known allergies.  Review of Systems   Review of Systems  Genitourinary: Positive for scrotal swelling.  All other systems reviewed and are negative.   Physical Exam Updated Vital Signs BP (!) 154/84   Pulse 81   Temp 98.3 F (36.8 C) (Oral)   Resp 13   Ht 6' (1.829 m)   Wt 125 kg   SpO2 97%   BMI 37.37 kg/m   Physical Exam Vitals and nursing note reviewed.  Constitutional:      Appearance: Normal appearance.  HENT:     Head: Normocephalic.     Nose: Nose normal.  Mouth/Throat:     Mouth: Mucous membranes are moist.  Eyes:     Extraocular Movements: Extraocular movements intact.     Pupils: Pupils are equal, round, and reactive to light.  Cardiovascular:     Rate and Rhythm: Normal rate and regular rhythm.     Pulses: Normal pulses.     Heart sounds: Normal heart sounds.  Pulmonary:     Effort: Pulmonary effort is normal.     Breath sounds: Normal breath sounds.  Abdominal:     Comments: Diffuse swelling in the inguinal area.  Patient does have good femoral pulses bilaterally.   Genitourinary:    Comments: Scrotum is erythematous.  No obvious cellulitis no subcutaneous air  Musculoskeletal:     Cervical back: Normal range of motion and neck supple.     Comments: Patient does have atrophy of the medial aspect of his left thigh.  Skin:    General:  Skin is warm.  Neurological:     Mental Status: He is alert.     Comments: I was unable to appreciate any patellar reflex.  Patient has visible atrophy of the medial aspect of the left thigh.  Left hip flexion and extension is normal.  Patient does have some slight weakness in the left knee extension knee flexion is normal.  Achilles reflexes are intact bilateral  Psychiatric:        Mood and Affect: Mood normal.     ED Results / Procedures / Treatments   Labs (all labs ordered are listed, but only abnormal results are displayed) Labs Reviewed  CBC WITH DIFFERENTIAL/PLATELET - Abnormal; Notable for the following components:      Result Value   Hemoglobin 12.4 (*)    HCT 38.0 (*)    All other components within normal limits  COMPREHENSIVE METABOLIC PANEL - Abnormal; Notable for the following components:   Glucose, Bld 120 (*)    All other components within normal limits  URINALYSIS, ROUTINE W REFLEX MICROSCOPIC    EKG None  Radiology No results found.  Procedures Procedures (including critical care time)  Medications Ordered in ED Medications  furosemide (LASIX) injection 40 mg (40 mg Intravenous Given 02/17/20 2221)    ED Course  I have reviewed the triage vital signs and the nursing notes.  Pertinent labs & imaging results that were available during my care of the patient were reviewed by me and considered in my medical decision making (see chart for details).    MDM Rules/Calculators/A&P                         Edward [name] is a 59 y.o. male presenting with left leg weakness and groin swelling.  Patient has been doing a lot of rowing exercises and initial concern was swelling causing the compression of the left femoral nerve.  Patient symptoms are going on for several weeks.  Patient definitely has atrophy of the medial aspect of the left thigh.  Patient has no back pain or spinal tenderness.  He has no patellar reflex but definitely has Achilles reflex.  I wonder if  it is femoral nerve palsy compression at the groin.  I discussed case with Dr. Lorrin Goodell from neurology.  He thinks likely compression at the femoral triangle vs lower lumbar area. I talked to radiology, who recommend CT ab/pel first to rule out mass.  If negative, may need MRI lumbar w/wo and possible MR pelvis looking for the exact location of the  femoral nerve compression.    12:06 AM Is labs and urinalysis were unremarkable.  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  Primary is uncertain.  Could be bladder versus prostate versus testicular.  I ordered MRI initially.  But I think at this point the cause of it is from underlying cancer.  I told him to follow-up with [facility] this week to arrange for a biopsy and then formulate a treatment plan.  Patient is not in urinary retention currently.   Final Clinical Impression(s) / ED Diagnoses Final diagnoses:  None    Rx / DC Orders ED Discharge Orders    None       Drenda Freeze, MD 02/18/20 0008

## 2020-02-18 NOTE — Discharge Instructions (Addendum)
He has a mass in her bladder that is likely cancerous.  He also have numerous lymph nodes in the retroperitoneum.  Femoral nerve is likely impinged from the underlying cancer.  You need to see [facility] this week for urgent biopsy.  Return to ER if you have trouble urinating and worse weakness, numbness, trouble walking

## 2020-02-18 NOTE — ED Notes (Signed)
Bladder scan showed 232 ml

## 2020-02-20 ENCOUNTER — Telehealth: Payer: Self-pay

## 2020-02-20 ENCOUNTER — Other Ambulatory Visit: Payer: Self-pay | Admitting: Family Medicine

## 2020-02-20 DIAGNOSIS — N3289 Other specified disorders of bladder: Secondary | ICD-10-CM

## 2020-02-20 NOTE — Telephone Encounter (Signed)
Called to let pt know that referral has been placed as requested

## 2020-02-20 NOTE — Telephone Encounter (Signed)
Pt. Called wanting to let Dr. Carlota Raspberry know that his visit to the ER found a cancerous mass. Pt. Was told to request a referral for urgent biopsy from the provider.   Good contact number [phone]

## 2020-02-20 NOTE — Progress Notes (Signed)
See ER visit - urgent urology referral placed.

## 2020-02-23 ENCOUNTER — Other Ambulatory Visit (HOSPITAL_COMMUNITY): Payer: Self-pay | Admitting: Urology

## 2020-02-23 ENCOUNTER — Other Ambulatory Visit: Payer: Self-pay | Admitting: Urology

## 2020-02-23 DIAGNOSIS — R599 Enlarged lymph nodes, unspecified: Secondary | ICD-10-CM

## 2020-02-24 ENCOUNTER — Other Ambulatory Visit (HOSPITAL_COMMUNITY): Payer: Self-pay | Admitting: Urology

## 2020-02-24 ENCOUNTER — Encounter (HOSPITAL_COMMUNITY): Payer: Self-pay

## 2020-02-24 DIAGNOSIS — R599 Enlarged lymph nodes, unspecified: Secondary | ICD-10-CM

## 2020-02-24 NOTE — Progress Notes (Signed)
Edward [name] Male, 59 y.o., February 08, 1961 MRN:  [id number] Phone:  [phone] Edward [name]) PCP:  [name], MD Coverage:  [employer] Nap Next Appt With Radiology ([facility]-CT 3) 03/05/2020 at 11:00 AM  RE: Biopsy Received: Today Message Details  Suttle, Rosanne Ashing, MD  [name] E Approved for CT guided lymph node biopsy. Best target most likely left retroperitoneal. Prominent left distal iliac chain nodes, but likely guarded by vasculature.   [name]   Previous Messages  ----- Message -----  From: Lenore Cordia  Sent: 02/24/2020 10:43 AM EST  To: Ir Procedure Requests  Subject: Biopsy                      Procedure Requested: [place] [employer]    Reason for Procedure:Enlarged lymph nodes, unspecified    Provider Requesting: Ardis Hughs  Provider Telephone: [phone]   Other Info:

## 2020-02-26 ENCOUNTER — Encounter: Payer: Self-pay | Admitting: Family Medicine

## 2020-02-27 ENCOUNTER — Encounter: Payer: Self-pay | Admitting: Family Medicine

## 2020-02-27 DIAGNOSIS — J90 Pleural effusion, not elsewhere classified: Secondary | ICD-10-CM

## 2020-02-27 DIAGNOSIS — R599 Enlarged lymph nodes, unspecified: Secondary | ICD-10-CM

## 2020-02-27 NOTE — Telephone Encounter (Signed)
Pt is concerned due to recent visit with specialty that he may have TB and this is causing further issue he is requesting quantiferon Gold testing and a CT or Chest XR  Pt is from [place]  Please advise

## 2020-02-27 NOTE — Telephone Encounter (Signed)
Request from pt

## 2020-02-29 ENCOUNTER — Telehealth: Payer: Self-pay | Admitting: Family Medicine

## 2020-02-29 DIAGNOSIS — R599 Enlarged lymph nodes, unspecified: Secondary | ICD-10-CM

## 2020-02-29 NOTE — Telephone Encounter (Signed)
PTs wife calling to F/UP on the request / please advise

## 2020-02-29 NOTE — Addendum Note (Signed)
Addended by: [name] R on: 02/29/2020 06:27 PM   Modules accepted: Orders

## 2020-02-29 NOTE — Telephone Encounter (Signed)
Fax [phone].  He would like to have done today before 5.

## 2020-02-29 NOTE — Telephone Encounter (Signed)
Pt is wanting to his order to sch tb sent to [facility] his work to do this test.  [address]  [place] [address]  Phone number: [phone]  PT would like a call if this is possible

## 2020-03-02 ENCOUNTER — Other Ambulatory Visit (HOSPITAL_COMMUNITY): Payer: Self-pay | Admitting: Physician Assistant

## 2020-03-05 ENCOUNTER — Other Ambulatory Visit: Payer: Self-pay

## 2020-03-05 ENCOUNTER — Ambulatory Visit (HOSPITAL_COMMUNITY)
Admission: RE | Admit: 2020-03-05 | Discharge: 2020-03-05 | Disposition: A | Discharge status: Home / Self Care | Payer: 59 | Source: Ambulatory Visit | Attending: Urology | Admitting: Urology

## 2020-03-05 DIAGNOSIS — R599 Enlarged lymph nodes, unspecified: Secondary | ICD-10-CM | POA: Diagnosis not present

## 2020-03-05 LAB — CBC
HCT: 40.8 % (ref 39.0–52.0)
Hemoglobin: 13.1 g/dL (ref 13.0–17.0)
MCH: 26.6 pg (ref 26.0–34.0)
MCHC: 32.1 g/dL (ref 30.0–36.0)
MCV: 82.9 fL (ref 80.0–100.0)
Platelets: 215 10*3/uL (ref 150–400)
RBC: 4.92 MIL/uL (ref 4.22–5.81)
RDW: 12.5 % (ref 11.5–15.5)
WBC: 9 10*3/uL (ref 4.0–10.5)
nRBC: 0 % (ref 0.0–0.2)

## 2020-03-05 LAB — PROTIME-INR
INR: 1 (ref 0.8–1.2)
Prothrombin Time: 13.2 seconds (ref 11.4–15.2)

## 2020-03-05 MED ORDER — LIDOCAINE HCL 1 % IJ SOLN
INTRAMUSCULAR | Status: AC
  Filled 2020-03-05: qty 20

## 2020-03-05 MED ORDER — SODIUM CHLORIDE 0.9 % IV SOLN: INTRAVENOUS | Status: DC

## 2020-03-05 MED ORDER — FENTANYL CITRATE (PF) 100 MCG/2ML IJ SOLN
INTRAMUSCULAR | Status: AC
  Filled 2020-03-05: qty 4

## 2020-03-05 MED ORDER — MIDAZOLAM HCL 2 MG/2ML IJ SOLN
INTRAMUSCULAR | Status: AC | PRN
  Administered 2020-03-05 (×2): 1 mg via INTRAVENOUS

## 2020-03-05 MED ORDER — FENTANYL CITRATE (PF) 100 MCG/2ML IJ SOLN
INTRAMUSCULAR | Status: AC | PRN
Start: 2020-03-05 — End: 2020-03-05
  Administered 2020-03-05: 50 ug via INTRAVENOUS

## 2020-03-05 MED ORDER — MIDAZOLAM HCL 2 MG/2ML IJ SOLN
INTRAMUSCULAR | Status: AC
  Filled 2020-03-05: qty 4

## 2020-03-05 MED ORDER — GELATIN ABSORBABLE 12-7 MM EX MISC
CUTANEOUS | Status: AC
  Filled 2020-03-05: qty 1

## 2020-03-05 NOTE — Discharge Instructions (Signed)

## 2020-03-05 NOTE — Procedures (Signed)
Interventional Radiology Procedure Note  Procedure: Ct core BX LEFT RP ADENOPATHY    Complications: None  Estimated Blood Loss:  MIN  Findings: 18 G CORES IN SALINE    Tamera Punt, MD

## 2020-03-05 NOTE — H&P (Signed)
Chief Complaint: Patient was seen in consultation today for lymphadenopathy.  Referring Physician(s): Herrick,Benjamin W  Supervising Physician: Daryll Brod  Patient Status: [facility] - Out-pt  History of Present Illness: Edward [name] is a 59 y.o. male with no significant past medical history who presents today for an image guided lymph node biopsy with moderate sedation. Edward [name] began to have left lower extremity weakness and numbness about a month ago, however when this continued to worsen he presented to the urgent care on 02/17/20 for further evaluation - he was noted to have no patellar reflex and was sent to the ED for further evaluation. He also reported a tight feeling in his leg, left thigh atrophy and scrotal swelling to the ED physician. Initial evaluation in the ED again noted no patellar reflex but definite Achilles reflex, the case was discussed with neurology and there was concern for a compression at the femoral triangle vs lower lumbar area so  CT abd/pelvis was recommended to rule out a mass. CT abd/pelvis w/contrast showed diffuse scrotal wall thickening and edema, irregular thickening of the left bladder wall concerning for bladder neoplasm and extensive retroperitoneal adenopathy. He was referred to urology as an outpatient and underwent cystoscopy with Dr. Louis Meckel on 02/20/20 which was unremarkable except for a thickened trabeculated bladder, rectal and scrotal exam were both normal. He has been referred to IR for a lymph node biopsy to further investigate CT findings.  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 He continues to have left leg numbness/weakness however he feels this may have improved some, left thigh atrophy and an intermittent feeling of fullness in his left groin/leg. He has otherwise been feeling well and denies any other complaints. He understands the procedure today and is agreeable to proceed as planned.  No  past medical history on file.  Past Surgical History:  Procedure Laterality Date  . CHOLECYSTECTOMY      Allergies: Patient has no known allergies.  Medications: Prior to Admission medications   Medication Sig Start Date End Date Taking? Authorizing Provider  ibuprofen (ADVIL) 200 MG tablet Take 800 mg by mouth every 6 (six) hours as needed for headache or moderate pain.   Yes [provider]  tamsulosin (FLOMAX) 0.4 MG CAPS capsule Take 0.4 mg by mouth every other day.   Yes [provider]     Family History  Problem Relation Age of Onset  . Hypertension Mother   . Heart disease Father   . Diabetes Father     Social History   Socioeconomic History  . Marital status: Married    Spouse name: Not on file  . Number of children: 2  . Years of education: Not on file  . Highest education level: Not on file  Occupational History  . Not on file  Tobacco Use  . Smoking status: Never Smoker  . Smokeless tobacco: Never Used  Vaping Use  . Vaping Use: Never used  Substance and Sexual Activity  . Alcohol use: Never  . Drug use: Never  . Sexual activity: Yes  Other Topics Concern  . Not on file  Social History Narrative  . Not on file   Social Determinants of Health   Financial Resource Strain:   . Difficulty of Paying Living Expenses: Not on file  Food Insecurity:   . Worried About Charity fundraiser in the Last Year: Not on file  . Ran Out of Food in  the Last Year: Not on file  Transportation Needs:   . Lack of Transportation (Medical): Not on file  . Lack of Transportation (Non-Medical): Not on file  Physical Activity:   . Days of Exercise per Week: Not on file  . Minutes of Exercise per Session: Not on file  Stress:   . Feeling of Stress : Not on file  Social Connections:   . Frequency of Communication with Friends and Family: Not on file  . Frequency of Social Gatherings with Friends and Family: Not on file  . Attends Religious Services:  Not on file  . Active Member of Clubs or Organizations: Not on file  . Attends Archivist Meetings: Not on file  . Marital Status: Not on file     Review of Systems: A 12 point ROS discussed and pertinent positives are indicated in the HPI above.  All other systems are negative.  Review of Systems  Constitutional: Negative for chills and fever.  Respiratory: Negative for cough and shortness of breath.   Cardiovascular: Negative for chest pain.  Gastrointestinal: Negative for abdominal pain, nausea and vomiting.  Musculoskeletal: Negative for back pain.       (+) left leg weakness, numbness, thigh atrophy  Neurological: Negative for dizziness and headaches.    Vital Signs: BP (!) 149/82 (BP Location: Right Arm)   Pulse 92   Temp 98.6 F (37 C) (Oral)   Resp 16   Ht 6' (1.829 m)   Wt 248 lb (112.5 kg)   SpO2 99%   BMI 33.63 kg/m   Physical Exam Vitals reviewed.  Constitutional:      General: He is not in acute distress. HENT:     Head: Normocephalic.     Mouth/Throat:     Mouth: Mucous membranes are moist.     Pharynx: Oropharynx is clear. No oropharyngeal exudate or posterior oropharyngeal erythema.  Cardiovascular:     Rate and Rhythm: Normal rate and regular rhythm.  Pulmonary:     Effort: Pulmonary effort is normal.     Breath sounds: Normal breath sounds.  Abdominal:     General: There is no distension.     Palpations: Abdomen is soft.     Tenderness: There is no abdominal tenderness.  Skin:    General: Skin is warm and dry.  Neurological:     Mental Status: He is alert and oriented to person, place, and time.  Psychiatric:        Mood and Affect: Mood normal.        Behavior: Behavior normal.        Thought Content: Thought content normal.        Judgment: Judgment normal.      MD Evaluation Airway: WNL Heart: WNL Chest/ Lungs: WNL ASA  Classification: 2 Mallampati/Airway Score: Two   Imaging: CT ABDOMEN PELVIS W CONTRAST  Result  Date: 02/17/2020 CLINICAL DATA:  59 year old male with abdominal trauma and scrotal swelling. EXAM: CT ABDOMEN AND PELVIS WITH CONTRAST TECHNIQUE: Multidetector CT imaging of the abdomen and pelvis was performed using the standard protocol following bolus administration of intravenous contrast. CONTRAST:  149mL OMNIPAQUE IOHEXOL 300 MG/ML  SOLN COMPARISON:  None. FINDINGS: Lower chest: Probable trace right pleural effusion. There are minimal bibasilar atelectasis. The visualized lung bases are otherwise clear. No intra-abdominal free air or free fluid. Hepatobiliary: The liver is unremarkable. No intrahepatic biliary dilatation. Cholecystectomy. No retained calcified stone noted in the central CBD. Pancreas: Unremarkable. No pancreatic ductal dilatation  or surrounding inflammatory changes. Spleen: Normal in size without focal abnormality. Adrenals/Urinary Tract: The adrenal glands unremarkable. There is mild left hydronephrosis. A transition is noted at the left ureteropelvic junction. There is also mild right hydronephrosis. No stone identified on either side. There is irregular thickening of the left bladder wall measuring up to 8 mm most consistent with an infiltrative mass/malignancy. There is probable associated narrowing of the left ureterovesical junction. Additional foci of infiltrative neoplasm may be present in the upper left ureter and left ureterovesical junction. Stomach/Bowel: There is no bowel obstruction or active inflammation. The appendix is normal. Vascular/Lymphatic: The abdominal aorta and IVC are unremarkable. No portal venous gas. There is extensive retroperitoneal adenopathy likely related to bladder neoplasm. Metastatic testicular cancer is not excluded. Further evaluation with testicular ultrasound is recommended. Reproductive: The prostate and seminal vesicles are grossly unremarkable. There is diffuse scrotal wall thickening and edema. No fluid collection. Other: None Musculoskeletal:  Degenerative changes of the spine. No acute osseous pathology. IMPRESSION: 1. 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 Further evaluation with cystoscopy is recommended. There is associated left hydronephrosis, likely related to obstruction of the left UVJ or possibly UPJ concerning for synchronous lesions. 2. Retroperitoneal adenopathy, likely related to urothelial neoplasm. Metastatic testicular cancer is not excluded. Further evaluation with testicular ultrasound is advised. 3. No bowel obstruction. Normal appendix. 4. Diffuse scrotal wall thickening and edema. Electronically Signed   By: Anner Crete M.D.   On: 02/17/2020 23:47    Labs:  CBC: Recent Labs    02/17/20 1542 02/17/20 1943 03/05/20 0944  WBC 6.1 8.4 9.0  HGB 12.5 12.4* 13.1  HCT 37.2 38.0* 40.8  PLT  --  207 215    COAGS: Recent Labs    03/05/20 0944  INR 1.0    BMP: Recent Labs    02/17/20 1943  NA 140  K 3.6  CL 103  CO2 27  GLUCOSE 120*  BUN 12  CALCIUM 9.2  CREATININE 1.04  GFRNONAA >60    LIVER FUNCTION TESTS: Recent Labs    02/17/20 1943  BILITOT 0.6  AST 23  ALT 17  ALKPHOS 60  PROT 7.7  ALBUMIN 3.7    TUMOR MARKERS: No results for input(s): AFPTM, CEA, CA199, CHROMGRNA in the last 8760 hours.  Assessment and Plan:  59 y/o M with left lower extremity numbness/weakness/feeling of fullness x 2-3 months found to have thickened bladder with extensive retroperitoneal lymphadenopathy on CT abd/pelvis 02/17/20 with unremarkable cystoscopy 11/15 who presents today for an image guided left external iliac vs retroperitoneal lymph node biopsy today with moderate sedation.  Patient has been NPO since midnight, no current anticoagulation/antiplatelet medications. Afebrile, WBC 9.0, hgb 13.1, plt 215, INR 1.0.  Risks and benefits of lymph node biopsy was discussed with the patient and/or patient's family including, but not limited to bleeding, infection, damage to adjacent structures or  low yield requiring additional tests.  All of the questions were answered and there is agreement to proceed.  Consent signed and in chart.  Thank you for this interesting consult.  I greatly enjoyed meeting Edward [name] and look forward to participating in their care.  A copy of this report was sent to the requesting provider on this date.  Electronically Signed: Joaquim Nam, PA-C 03/05/2020, 10:46 AM   I spent a total of 30 Minutes   in face to face in clinical consultation, greater than 50% of which was counseling/coordinating care for lymph node biopsy.

## 2020-03-07 LAB — SURGICAL PATHOLOGY

## 2020-03-12 ENCOUNTER — Telehealth: Payer: Self-pay | Admitting: Oncology

## 2020-03-12 NOTE — Telephone Encounter (Signed)
Received a new pt referral from Dr. Louis Meckel at [facility] for treatment of metastatic TCC. Mr. Wandrey has been scheduled to see Dr. Alen Blew on 12/9 at 2pm. Appt date and time has been given to the pt's nephew. Aware to aarrive 30 minutes early.

## 2020-03-15 ENCOUNTER — Telehealth: Payer: Self-pay

## 2020-03-15 ENCOUNTER — Other Ambulatory Visit: Payer: Self-pay

## 2020-03-15 ENCOUNTER — Inpatient Hospital Stay: Payer: 59 | Attending: Oncology | Admitting: Oncology

## 2020-03-15 VITALS — BP 145/80 | HR 67 | Temp 97.7°F | Resp 17 | Ht 72.0 in | Wt 242.3 lb

## 2020-03-15 DIAGNOSIS — Z79899 Other long term (current) drug therapy: Secondary | ICD-10-CM | POA: Insufficient documentation

## 2020-03-15 DIAGNOSIS — R590 Localized enlarged lymph nodes: Secondary | ICD-10-CM | POA: Insufficient documentation

## 2020-03-15 DIAGNOSIS — C679 Malignant neoplasm of bladder, unspecified: Secondary | ICD-10-CM | POA: Insufficient documentation

## 2020-03-15 DIAGNOSIS — Z5111 Encounter for antineoplastic chemotherapy: Secondary | ICD-10-CM | POA: Insufficient documentation

## 2020-03-15 DIAGNOSIS — N1330 Unspecified hydronephrosis: Secondary | ICD-10-CM | POA: Insufficient documentation

## 2020-03-15 MED ORDER — PROCHLORPERAZINE MALEATE 10 MG PO TABS: 10.0000 mg | ORAL_TABLET | Freq: Four times a day (QID) | ORAL | 0 refills | Status: DC | PRN

## 2020-03-15 NOTE — Progress Notes (Signed)
START ON PATHWAY REGIMEN - Bladder     A cycle is every 21 days:     Gemcitabine      Cisplatin   **Always confirm dose/schedule in your pharmacy ordering system**  Patient Characteristics: Advanced/Metastatic Disease, First Line, No Prior Platinum-Based Therapy, Good Renal Function (CrCl ? 50 mL/min) Therapeutic Status: Advanced/Metastatic Disease Line of Therapy: First Line Prior Platinum-Based Therapy<= No Renal Function: Good Renal Function (CrCl ? 50 mL/min) Intent of Therapy: Non-Curative / Palliative Intent, Discussed with Patient 

## 2020-03-15 NOTE — Progress Notes (Signed)
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  HPI: I was asked by Dr. Randall Hiss to evaluate [name] for the evaluation of advanced malignancy.  He is a 59 year old man without any significant comorbid conditions presented with lower extremity weakness and swelling on the left side on February 17, 2020.  He underwent a CT scan of the abdomen and pelvis with contrast which showed mild left hydronephrosis with a transition noted in the left ureteropelvic junction.  There is also mild right hydronephrosis.  Irregular thickening of the left bladder wall was also noted.  There is a probable narrowing at the left ureterovesicular junction.  Additional foci of infiltrative neoplasm may be present and the upper left ureter and left ureterovesicular junction.   Based on these findings, he was evaluated by Dr. Louis Meckel and underwent a flexible cystoscopy which showed no abnormalities in the mucosa.  No tumor or infiltrative process noted except for thickening and trabeculated bladder.  Based on these findings he underwent CT-guided biopsy of a left retroperitoneal lymph node on March 05, 2020.  The final pathology showed poorly differentiated carcinoma is positive for cytokeratin 7, cytokeratin 20 Gata-3 p40 and CDX2.  The pattern is consistent with urothelial primary.  Clinically, he reports increase in his left thigh and scrotum which has fluctuated in the last month.  He does report some mild weakness and discomfort in that leg which interfered with his exercise.  He denies any weight loss or appetite changes.  He denies any hematuria or dysuria.     He does not report any headaches, blurry vision, syncope or seizures. Does not report any fevers, chills or sweats.  Does not report any cough, wheezing or hemoptysis.  Does not report any chest pain, palpitation, orthopnea or leg edema.  Does not report any nausea, vomiting or abdominal pain.  Does not report any constipation or  diarrhea.  Does not report any skeletal complaints.    Does not report frequency, urgency or hematuria.  Does not report any skin rashes or lesions. Does not report any heat or cold intolerance.  Does not report any lymphadenopathy or petechiae.  Does not report any anxiety or depression.  Remaining review of systems is negative.    No past medical history on file.:  Past Surgical History:  Procedure Laterality Date  . CHOLECYSTECTOMY    :   Current Outpatient Medications:  .  ibuprofen (ADVIL) 200 MG tablet, Take 800 mg by mouth every 6 (six) hours as needed for headache or moderate pain., Disp: , Rfl:  .  tamsulosin (FLOMAX) 0.4 MG CAPS capsule, Take 0.4 mg by mouth every other day., Disp: , Rfl: :  No Known Allergies:  Family History  Problem Relation Age of Onset  . Hypertension Mother   . Heart disease Father   . Diabetes Father   :  Social History   Socioeconomic History  . Marital status: Married    Spouse name: Not on file  . Number of children: 2  . Years of education: Not on file  . Highest education level: Not on file  Occupational History  . Not on file  Tobacco Use  . Smoking status: Never Smoker  . Smokeless tobacco: Never Used  Vaping Use  . Vaping Use: Never used  Substance and Sexual Activity  . Alcohol use: Never  . Drug use: Never  . Sexual activity: Yes  Other Topics Concern  . Not on file  Social History  Narrative  . Not on file   Social Determinants of Health   Financial Resource Strain: Not on file  Food Insecurity: Not on file  Transportation Needs: Not on file  Physical Activity: Not on file  Stress: Not on file  Social Connections: Not on file  Intimate Partner Violence: Not on file  :  Pertinent items are noted in HPI.  Exam: Blood pressure (!) 145/80, pulse 67, temperature 97.7 F (36.5 C), temperature source Tympanic, resp. rate 17, height 6' (1.829 m), weight 242 lb 4.8 oz (109.9 kg), SpO2 100 %.  ECOG 0  General  appearance: alert and cooperative appeared without distress. Head: atraumatic without any abnormalities. Eyes: conjunctivae/corneas clear. PERRL.  Sclera anicteric. Throat: lips, mucosa, and tongue normal; without oral thrush or ulcers. Resp: clear to auscultation bilaterally without rhonchi, wheezes or dullness to percussion. Cardio: regular rate and rhythm, S1, S2 normal, no murmur, click, rub or gallop GI: soft, non-tender; bowel sounds normal; no masses,  no organomegaly  GU exam: Scrotal edema noted more left than right. Skin: Skin color, texture, turgor normal. No rashes or lesions Lymph nodes: Cervical, supraclavicular, and axillary nodes normal. Neurologic: Grossly normal without any motor, sensory or deep tendon reflexes. Musculoskeletal: No joint deformity or effusion.  Thigh swelling noted.    CT ABDOMEN PELVIS W CONTRAST  Result Date: 02/17/2020 CLINICAL DATA:  59 year old male with abdominal trauma and scrotal swelling. EXAM: CT ABDOMEN AND PELVIS WITH CONTRAST TECHNIQUE: Multidetector CT imaging of the abdomen and pelvis was performed using the standard protocol following bolus administration of intravenous contrast. CONTRAST:  170mL OMNIPAQUE IOHEXOL 300 MG/ML  SOLN COMPARISON:  None. FINDINGS: Lower chest: Probable trace right pleural effusion. There are minimal bibasilar atelectasis. The visualized lung bases are otherwise clear. No intra-abdominal free air or free fluid. Hepatobiliary: The liver is unremarkable. No intrahepatic biliary dilatation. Cholecystectomy. No retained calcified stone noted in the central CBD. Pancreas: Unremarkable. No pancreatic ductal dilatation or surrounding inflammatory changes. Spleen: Normal in size without focal abnormality. Adrenals/Urinary Tract: The adrenal glands unremarkable. There is mild left hydronephrosis. A transition is noted at the left ureteropelvic junction. There is also mild right hydronephrosis. No stone identified on either side.  There is irregular thickening of the left bladder wall measuring up to 8 mm most consistent with an infiltrative mass/malignancy. There is probable associated narrowing of the left ureterovesical junction. Additional foci of infiltrative neoplasm may be present in the upper left ureter and left ureterovesical junction. Stomach/Bowel: There is no bowel obstruction or active inflammation. The appendix is normal. Vascular/Lymphatic: The abdominal aorta and IVC are unremarkable. No portal venous gas. There is extensive retroperitoneal adenopathy likely related to bladder neoplasm. Metastatic testicular cancer is not excluded. Further evaluation with testicular ultrasound is recommended. Reproductive: The prostate and seminal vesicles are grossly unremarkable. There is diffuse scrotal wall thickening and edema. No fluid collection. Other: None Musculoskeletal: Degenerative changes of the spine. No acute osseous pathology. IMPRESSION: 1. 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 Further evaluation with cystoscopy is recommended. There is associated left hydronephrosis, likely related to obstruction of the left UVJ or possibly UPJ concerning for synchronous lesions. 2. Retroperitoneal adenopathy, likely related to urothelial neoplasm. Metastatic testicular cancer is not excluded. Further evaluation with testicular ultrasound is advised. 3. No bowel obstruction. Normal appendix. 4. Diffuse scrotal wall thickening and edema. Electronically Signed   By: Anner Crete M.D.   On: 02/17/2020 23:47   CT BIOPSY  Result Date: 03/05/2020 INDICATION: Bulky retroperitoneal and  iliac adenopathy, concern for lymphoproliferative process EXAM: CT CORE BIOPSY LEFT RETROPERITONEAL BULKY ADENOPATHY MEDICATIONS: 1% LIDOCAINE LOCAL ANESTHESIA/SEDATION: Moderate (conscious) sedation was employed during this procedure. A total of Versed 2.0 mg and Fentanyl 50 mcg was administered intravenously. Moderate Sedation Time: 17 minutes.  The patient's level of consciousness and vital signs were monitored continuously by radiology nursing throughout the procedure under my direct supervision. FLUOROSCOPY TIME:  Fluoroscopy Time: NONE. COMPLICATIONS: None immediate. PROCEDURE: Informed written consent was obtained from the patient after a thorough discussion of the procedural risks, benefits and alternatives. All questions were addressed. Maximal Sterile Barrier Technique was utilized including caps, mask, sterile gowns, sterile gloves, sterile drape, hand hygiene and skin antiseptic. A timeout was performed prior to the initiation of the procedure. Previous imaging reviewed. Patient positioned prone. Noncontrast localization CT performed. The bulky retroperitoneal adenopathy was localized and marked for a left posterior approach. At the L2 level, under sterile conditions and local anesthesia, a 17 gauge 11.8 cm access needle was advanced from a posterior paraspinous approach. Needle position confirmed at the adenopathy with CT. 18 gauge core biopsies obtained. Samples placed in saline. Samples were intact and non fragmented. Needle removed. Postprocedure imaging demonstrates no hemorrhage or hematoma. Patient tolerated biopsy well. IMPRESSION: Successful CT-guided left retroperitoneal bulky adenopathy 18 gauge core biopsies. Electronically Signed   By: Jerilynn Mages.  Shick M.D.   On: 03/05/2020 12:36    Assessment and Plan:   59 year old man with:  1.  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  Biopsy showed a likelihood of transitional cell carcinoma arising from a GU tract.  CT scan and pathology results were personally reviewed and discussed with the patient and his family today extensively.  The differential diagnosis was also reviewed.  Metastatic GU cancer from an unknown primary is a distinct possibility.  GI cancers, lung cancer or cutaneous malignancy is considered less  likely.  From a management standpoint, I favor starting upfront systemic therapy and potentially use surgical resection only if there is a tumor isolated at a later date and he has a complete response.  Treatment options would include systemic chemotherapy versus immunotherapy.  I will obtain PD-L1 testing and next generation sequencing to fully identify any genetic drivers at this time.  I feel the chemotherapy offers the best response rate unless he has PD-L1 overexpression.  I still think that chemotherapy upfront remains the most logical choice at this time given his symptoms.   The logistics and rationale for using chemotherapy was reviewed today in detail.  Complication associated with cisplatin and gemcitabine chemotherapy was discussed.  These complications include nausea, vomiting, myelosuppression, fatigue, infusion related complications, renal insufficiency, neutropenia, neutropenic sepsis and rarely serious thrombosis, hospitalization and death.  The benefit would also if he has an excellent response to chemotherapy, curative surgical resection may be attempted.  The plan is to treat with gemcitabine and cisplatin on day 1, gemcitabine day 8 out of a 21-day cycle.  Anticipate needing 6 cycles of therapy    After discussion today, he is agreeable to proceed after chemo education class.     2.  IV access: Risks and benefits of using Port-A-Cath versus peripheral veins was discussed today.  Complication associated with Port-A-Cath insertion include bleeding, infection and thrombosis.  After discussing the risks and benefits, he opted against it for the time being.  We will consider that in the future.   3.  Antiemetics: Prescription for Compazine was made available to  him.   4.  Renal function surveillance:  Baseline kidney function is normal I will continue to monitor on platinum therapy.   5.  Goals of care:  Therapy is palliative although aggressive measures are warranted.   6.   Follow-up: will be in the immediate future to start chemotherapy.   80  minutes were dedicated to this visit. The time was spent on reviewing laboratory data, imaging studies, discussing treatment options, discussing differential diagnosis and answering questions regarding future plan.     A copy of this consult has been forwarded to the requesting physician.

## 2020-03-15 NOTE — Telephone Encounter (Signed)
Per Dr. Alen Blew, call placed to pathology to request PDL 1 and foundation one testing be added to biopsy from 03/05/20. Spoke to [employer] at [facility] pathology and made her aware. She verbalized understanding and will add tests.

## 2020-03-16 ENCOUNTER — Other Ambulatory Visit: Payer: Self-pay | Admitting: Urology

## 2020-03-19 ENCOUNTER — Encounter (HOSPITAL_BASED_OUTPATIENT_CLINIC_OR_DEPARTMENT_OTHER): Payer: Self-pay | Admitting: Urology

## 2020-03-19 ENCOUNTER — Other Ambulatory Visit (HOSPITAL_COMMUNITY)
Admission: RE | Admit: 2020-03-19 | Discharge: 2020-03-19 | Disposition: A | Discharge status: Home / Self Care | Payer: 59 | Source: Ambulatory Visit | Attending: Urology | Admitting: Urology

## 2020-03-19 ENCOUNTER — Other Ambulatory Visit (HOSPITAL_COMMUNITY): Payer: Self-pay | Admitting: Urology

## 2020-03-19 ENCOUNTER — Other Ambulatory Visit: Payer: Self-pay

## 2020-03-19 DIAGNOSIS — R599 Enlarged lymph nodes, unspecified: Secondary | ICD-10-CM

## 2020-03-19 DIAGNOSIS — Z01812 Encounter for preprocedural laboratory examination: Secondary | ICD-10-CM | POA: Diagnosis not present

## 2020-03-19 DIAGNOSIS — Z20822 Contact with and (suspected) exposure to covid-19: Secondary | ICD-10-CM | POA: Insufficient documentation

## 2020-03-19 LAB — SARS CORONAVIRUS 2 (TAT 6-24 HRS): SARS Coronavirus 2: NEGATIVE

## 2020-03-19 NOTE — Progress Notes (Signed)
Spoke w/ via phone for pre-op interview--- PT Lab needs dos----  no             Lab results------ no COVID test ------ done today 03-19-2020 results in epic Arrive at ------- 1100 NPO after MN NO Solid Food.  Clear liquids from MN until--- 1000 Medications to take morning of surgery ----- NONE Diabetic medication ----- n/a Patient Special Instructions ----- n/a Pre-Op special Istructions ----- n/a Patient verbalized understanding of instructions that were given at this phone interview. Patient denies shortness of breath, chest pain, fever, cough at this phone interview.

## 2020-03-21 ENCOUNTER — Ambulatory Visit (HOSPITAL_COMMUNITY): Payer: 59

## 2020-03-21 ENCOUNTER — Ambulatory Visit (HOSPITAL_BASED_OUTPATIENT_CLINIC_OR_DEPARTMENT_OTHER): Payer: 59 | Admitting: Anesthesiology

## 2020-03-21 ENCOUNTER — Telehealth: Payer: Self-pay | Admitting: Oncology

## 2020-03-21 ENCOUNTER — Ambulatory Visit (HOSPITAL_COMMUNITY)
Admission: RE | Admit: 2020-03-21 | Discharge: 2020-03-21 | Disposition: A | Discharge status: Home / Self Care | Payer: 59 | Source: Ambulatory Visit | Attending: Urology | Admitting: Urology

## 2020-03-21 ENCOUNTER — Encounter (HOSPITAL_BASED_OUTPATIENT_CLINIC_OR_DEPARTMENT_OTHER): Payer: Self-pay | Admitting: Urology

## 2020-03-21 ENCOUNTER — Other Ambulatory Visit: Payer: Self-pay

## 2020-03-21 ENCOUNTER — Other Ambulatory Visit: Payer: 59

## 2020-03-21 ENCOUNTER — Encounter (HOSPITAL_BASED_OUTPATIENT_CLINIC_OR_DEPARTMENT_OTHER)
Admission: RE | Disposition: A | Discharge status: Home / Self Care | Payer: Self-pay | Source: Home / Self Care | Attending: Urology

## 2020-03-21 ENCOUNTER — Ambulatory Visit (HOSPITAL_BASED_OUTPATIENT_CLINIC_OR_DEPARTMENT_OTHER)
Admission: RE | Admit: 2020-03-21 | Discharge: 2020-03-21 | Disposition: A | Discharge status: Home / Self Care | Payer: 59 | Attending: Urology | Admitting: Urology

## 2020-03-21 DIAGNOSIS — N5089 Other specified disorders of the male genital organs: Secondary | ICD-10-CM | POA: Diagnosis not present

## 2020-03-21 DIAGNOSIS — C779 Secondary and unspecified malignant neoplasm of lymph node, unspecified: Secondary | ICD-10-CM | POA: Insufficient documentation

## 2020-03-21 DIAGNOSIS — N1330 Unspecified hydronephrosis: Secondary | ICD-10-CM | POA: Insufficient documentation

## 2020-03-21 DIAGNOSIS — Z79899 Other long term (current) drug therapy: Secondary | ICD-10-CM | POA: Diagnosis not present

## 2020-03-21 DIAGNOSIS — R599 Enlarged lymph nodes, unspecified: Secondary | ICD-10-CM | POA: Insufficient documentation

## 2020-03-21 DIAGNOSIS — C801 Malignant (primary) neoplasm, unspecified: Secondary | ICD-10-CM | POA: Insufficient documentation

## 2020-03-21 DIAGNOSIS — C679 Malignant neoplasm of bladder, unspecified: Secondary | ICD-10-CM

## 2020-03-21 DIAGNOSIS — C7911 Secondary malignant neoplasm of bladder: Secondary | ICD-10-CM | POA: Insufficient documentation

## 2020-03-21 HISTORY — PX: CYSTOSCOPY WITH BIOPSY: SHX5122

## 2020-03-21 HISTORY — DX: Personal history of urinary calculi: Z87.442

## 2020-03-21 HISTORY — DX: Secondary malignant neoplasm of unspecified urinary organs: C79.10

## 2020-03-21 HISTORY — PX: CYSTOSCOPY WITH RETROGRADE PYELOGRAM, URETEROSCOPY AND STENT PLACEMENT: SHX5789

## 2020-03-21 LAB — GLUCOSE, CAPILLARY: Glucose-Capillary: 104 mg/dL — ABNORMAL HIGH (ref 70–99)

## 2020-03-21 SURGERY — CYSTOURETEROSCOPY, WITH RETROGRADE PYELOGRAM AND STENT INSERTION
Anesthesia: General | Site: Ureter | Laterality: Left

## 2020-03-21 MED ORDER — TRAMADOL HCL 50 MG PO TABS: 50.0000 mg | ORAL_TABLET | Freq: Four times a day (QID) | ORAL | 0 refills | Status: DC | PRN

## 2020-03-21 MED ORDER — IOHEXOL 300 MG/ML  SOLN
INTRAMUSCULAR | Status: DC | PRN
  Administered 2020-03-21: 15:00:00 50 mL via URETHRAL

## 2020-03-21 MED ORDER — FENTANYL CITRATE (PF) 100 MCG/2ML IJ SOLN
INTRAMUSCULAR | Status: AC
  Filled 2020-03-21: qty 2

## 2020-03-21 MED ORDER — OXYCODONE HCL 5 MG PO TABS
ORAL_TABLET | ORAL | Status: AC
  Filled 2020-03-21: qty 1

## 2020-03-21 MED ORDER — TAMSULOSIN HCL 0.4 MG PO CAPS: 0.4000 mg | ORAL_CAPSULE | Freq: Every day | ORAL | 11 refills | Status: AC

## 2020-03-21 MED ORDER — HYDROMORPHONE HCL 1 MG/ML IJ SOLN
INTRAMUSCULAR | Status: AC
  Filled 2020-03-21: qty 1

## 2020-03-21 MED ORDER — MIDAZOLAM HCL 5 MG/5ML IJ SOLN
INTRAMUSCULAR | Status: DC | PRN
  Administered 2020-03-21: 2 mg via INTRAVENOUS

## 2020-03-21 MED ORDER — CEFAZOLIN SODIUM-DEXTROSE 2-4 GM/100ML-% IV SOLN
INTRAVENOUS | Status: AC
  Filled 2020-03-21: qty 100

## 2020-03-21 MED ORDER — 0.9 % SODIUM CHLORIDE (POUR BTL) OPTIME
TOPICAL | Status: DC | PRN
  Administered 2020-03-21: 15:00:00 500 mL

## 2020-03-21 MED ORDER — BELLADONNA ALKALOIDS-OPIUM 16.2-60 MG RE SUPP
RECTAL | Status: DC | PRN
  Administered 2020-03-21: 1 via RECTAL

## 2020-03-21 MED ORDER — SODIUM CHLORIDE 0.9 % IR SOLN
Status: DC | PRN
  Administered 2020-03-21: 7000 mL via INTRAVESICAL

## 2020-03-21 MED ORDER — AMISULPRIDE (ANTIEMETIC) 5 MG/2ML IV SOLN: 10.0000 mg | Freq: Once | INTRAVENOUS | Status: DC | PRN

## 2020-03-21 MED ORDER — KETOROLAC TROMETHAMINE 30 MG/ML IJ SOLN
INTRAMUSCULAR | Status: AC
  Filled 2020-03-21: qty 1

## 2020-03-21 MED ORDER — FENTANYL CITRATE (PF) 100 MCG/2ML IJ SOLN
25.0000 ug | INTRAMUSCULAR | Status: DC | PRN
  Administered 2020-03-21 (×3): 50 ug via INTRAVENOUS

## 2020-03-21 MED ORDER — FLUDEOXYGLUCOSE F - 18 (FDG) INJECTION
11.9700 | Freq: Once | INTRAVENOUS | Status: AC
  Administered 2020-03-21: 11.97 via INTRAVENOUS

## 2020-03-21 MED ORDER — PROPOFOL 10 MG/ML IV BOLUS
INTRAVENOUS | Status: AC
  Filled 2020-03-21: qty 20

## 2020-03-21 MED ORDER — ACETAMINOPHEN 500 MG PO TABS
1000.0000 mg | ORAL_TABLET | Freq: Once | ORAL | Status: AC
  Administered 2020-03-21: 12:00:00 1000 mg via ORAL

## 2020-03-21 MED ORDER — MIDAZOLAM HCL 2 MG/2ML IJ SOLN
INTRAMUSCULAR | Status: AC
  Filled 2020-03-21: qty 2

## 2020-03-21 MED ORDER — DEXAMETHASONE SODIUM PHOSPHATE 10 MG/ML IJ SOLN
INTRAMUSCULAR | Status: AC
  Filled 2020-03-21: qty 1

## 2020-03-21 MED ORDER — ONDANSETRON HCL 4 MG/2ML IJ SOLN
INTRAMUSCULAR | Status: DC | PRN
  Administered 2020-03-21: 4 mg via INTRAVENOUS

## 2020-03-21 MED ORDER — BELLADONNA ALKALOIDS-OPIUM 16.2-60 MG RE SUPP
RECTAL | Status: AC
  Filled 2020-03-21: qty 1

## 2020-03-21 MED ORDER — ONDANSETRON HCL 4 MG/2ML IJ SOLN
INTRAMUSCULAR | Status: AC
  Filled 2020-03-21: qty 2

## 2020-03-21 MED ORDER — LACTATED RINGERS IV SOLN
INTRAVENOUS | Status: DC
  Administered 2020-03-21: 17:00:00 1000 mL via INTRAVENOUS

## 2020-03-21 MED ORDER — HYDROMORPHONE HCL 1 MG/ML IJ SOLN
0.5000 mg | INTRAMUSCULAR | Status: DC | PRN
  Administered 2020-03-21 (×2): 0.5 mg via INTRAVENOUS

## 2020-03-21 MED ORDER — CEFAZOLIN SODIUM-DEXTROSE 2-4 GM/100ML-% IV SOLN
2.0000 g | Freq: Once | INTRAVENOUS | Status: AC
  Administered 2020-03-21: 13:00:00 2 g via INTRAVENOUS

## 2020-03-21 MED ORDER — DEXAMETHASONE SODIUM PHOSPHATE 10 MG/ML IJ SOLN
INTRAMUSCULAR | Status: DC | PRN
  Administered 2020-03-21: 10 mg via INTRAVENOUS

## 2020-03-21 MED ORDER — ACETAMINOPHEN 500 MG PO TABS
ORAL_TABLET | ORAL | Status: AC
  Filled 2020-03-21: qty 2

## 2020-03-21 MED ORDER — FENTANYL CITRATE (PF) 100 MCG/2ML IJ SOLN
INTRAMUSCULAR | Status: DC | PRN
  Administered 2020-03-21 (×4): 50 ug via INTRAVENOUS

## 2020-03-21 MED ORDER — LIDOCAINE 2% (20 MG/ML) 5 ML SYRINGE
INTRAMUSCULAR | Status: DC | PRN
  Administered 2020-03-21: 60 mg via INTRAVENOUS

## 2020-03-21 MED ORDER — KETOROLAC TROMETHAMINE 30 MG/ML IJ SOLN
30.0000 mg | Freq: Once | INTRAMUSCULAR | Status: AC
  Administered 2020-03-21: 17:00:00 30 mg via INTRAVENOUS

## 2020-03-21 MED ORDER — PHENAZOPYRIDINE HCL 200 MG PO TABS: 200.0000 mg | ORAL_TABLET | Freq: Three times a day (TID) | ORAL | 0 refills | Status: DC | PRN

## 2020-03-21 MED ORDER — STERILE WATER FOR IRRIGATION IR SOLN
Status: DC | PRN
  Administered 2020-03-21: 500 mL

## 2020-03-21 MED ORDER — PROPOFOL 10 MG/ML IV BOLUS
INTRAVENOUS | Status: DC | PRN
  Administered 2020-03-21: 160 mg via INTRAVENOUS

## 2020-03-21 MED ORDER — OXYCODONE HCL 5 MG PO TABS
5.0000 mg | ORAL_TABLET | ORAL | Status: DC | PRN
Start: 2020-03-21 — End: 2020-03-21
  Administered 2020-03-21: 5 mg via ORAL

## 2020-03-21 SURGICAL SUPPLY — 39 items
BAG DRAIN URO-CYSTO SKYTR STRL (DRAIN) ×4 IMPLANT
BAG DRN RND TRDRP ANRFLXCHMBR (UROLOGICAL SUPPLIES) ×2
BAG DRN UROCATH (DRAIN) ×2
BAG URINE DRAIN 2000ML AR STRL (UROLOGICAL SUPPLIES) ×4 IMPLANT
BASKET LASER NITINOL 1.9FR (BASKET) IMPLANT
BRUSH URET BIOPSY 3F (UROLOGICAL SUPPLIES) ×4 IMPLANT
BSKT STON RTRVL 120 1.9FR (BASKET)
CATH COUDE FOLEY 2W 5CC 18FR (CATHETERS) ×4 IMPLANT
CATH URET 5FR 28IN OPEN ENDED (CATHETERS) ×8 IMPLANT
CATH URET DUAL LUMEN 6-10FR 50 (CATHETERS) IMPLANT
CLOTH BEACON ORANGE TIMEOUT ST (SAFETY) ×4 IMPLANT
CNTNR URN SCR LID CUP LEK RST (MISCELLANEOUS) ×6 IMPLANT
CONT SPEC 4OZ STRL OR WHT (MISCELLANEOUS) ×12
DRAPE C-ARM 42X120 X-RAY (DRAPES) ×4 IMPLANT
DRSG TELFA 3X8 NADH (GAUZE/BANDAGES/DRESSINGS) ×4 IMPLANT
ELECT REM PT RETURN 9FT ADLT (ELECTROSURGICAL) ×4
ELECTRODE REM PT RTRN 9FT ADLT (ELECTROSURGICAL) ×2 IMPLANT
EXTRACTOR STONE 1.7FRX115CM (UROLOGICAL SUPPLIES) IMPLANT
GLOVE BIO SURGEON STRL SZ7.5 (GLOVE) ×4 IMPLANT
GOWN STRL REUS W/ TWL XL LVL3 (GOWN DISPOSABLE) ×2 IMPLANT
GOWN STRL REUS W/TWL XL LVL3 (GOWN DISPOSABLE) ×8 IMPLANT
GUIDEWIRE STR DUAL SENSOR (WIRE) ×8 IMPLANT
IV NS 500ML (IV SOLUTION) ×4
IV NS 500ML BAXH (IV SOLUTION) ×2 IMPLANT
IV NS IRRIG 3000ML ARTHROMATIC (IV SOLUTION) ×12 IMPLANT
KIT TURNOVER CYSTO (KITS) ×4 IMPLANT
MANIFOLD NEPTUNE II (INSTRUMENTS) ×4 IMPLANT
NS IRRIG 500ML POUR BTL (IV SOLUTION) ×4 IMPLANT
PACK CYSTO (CUSTOM PROCEDURE TRAY) ×4 IMPLANT
STENT URET 6FRX26 CONTOUR (STENTS) ×4 IMPLANT
SYR TOOMEY IRRIG 70ML (MISCELLANEOUS) ×4
SYRINGE TOOMEY IRRIG 70ML (MISCELLANEOUS) ×2 IMPLANT
TRACTIP FLEXIVA PULS ID 200XHI (Laser) IMPLANT
TRACTIP FLEXIVA PULSE ID 200 (Laser)
TUBE CONNECTING 12'X1/4 (SUCTIONS)
TUBE CONNECTING 12X1/4 (SUCTIONS) IMPLANT
TUBING UROLOGY SET (TUBING) ×4 IMPLANT
WATER STERILE IRR 3000ML UROMA (IV SOLUTION) ×4 IMPLANT
WATER STERILE IRR 500ML POUR (IV SOLUTION) ×4 IMPLANT

## 2020-03-21 NOTE — Interval H&P Note (Signed)
History and Physical Interval Note:  03/21/2020 1:01 PM  Edward [name]  has presented today for surgery, with the diagnosis of METASTATIC UROTHELIAL CARCINOMA.  The various methods of treatment have been discussed with the patient and family. After consideration of risks, benefits and other options for treatment, the patient has consented to  Procedure(s) with comments: CYSTOSCOPY WITH BILATERAL RETROGRADE PYELOGRAM, LEFT URETEROSCOPY AND LEFT  STENT PLACEMENT (Bilateral) - RAD TECH CYSTOSCOPY WITH BLADDER BIOPSY POSSIBLE LEFT URETERAL BIOPSY (Left) as a surgical intervention.  The patient's history has been reviewed, patient examined, no change in status, stable for surgery.  I have reviewed the patient's chart and labs.  Questions were answered to the patient's satisfaction.     Ardis Hughs

## 2020-03-21 NOTE — Discharge Instructions (Signed)
DISCHARGE INSTRUCTIONS FOR KIDNEY STONE/URETERAL STENT    MEDICATIONS:  1. Resume all your other meds from home - except do not take any extra narcotic pain meds that you may have at home.  2. Pyridium is to help with the burning/stinging when you urinate. 3. Tramadol is for moderate/severe pain, otherwise taking upto 1000 mg every 6 hours of plainTylenol will help treat your pain.      ACTIVITY:  1. No strenuous activity x 1week  2. No driving while on narcotic pain medications  3. Drink plenty of water  4. Continue to walk at home - you can still get blood clots when you are at home, so keep active, but don't over do it.  5. May return to work/school tomorrow or when you feel ready   BATHING:  1. You can shower and we recommend daily showers    SIGNS/SYMPTOMS TO CALL:  Please call us if you have a fever greater than 101.5, uncontrolled nausea/vomiting, uncontrolled pain, dizziness, unable to urinate, bloody urine, chest pain, shortness of breath, leg swelling, leg pain, redness around wound, drainage from wound, or any other concerns or questions.   You can reach [place] at [phone].   FOLLOW-UP:  1. You have an appointment in 2 weeks.  Foley Catheter Care A soft, flexible tube (Foley catheter) may have been placed in your bladder to drain urine and fluid. Follow these instructions:  [place] ON Friday MORNING  Taking Care of the Catheter  Keep the area where the catheter leaves your body clean.   Attach the catheter to the leg so there is no tension on the catheter.   Keep the drainage bag below the level of the bladder, but keep it OFF the floor.   Do not take long soaking baths. Your caregiver will give instructions about showering.   Wash your hands before touching ANYTHING related to the catheter or bag.   Using mild soap and warm water on a washcloth:   Clean the area closest to the catheter insertion site using a circular motion around the catheter.    Clean the catheter itself by wiping AWAY from the insertion site for several inches down the tube.   NEVER wipe upward as this could sweep bacteria up into the urethra (tube in your body that normally drains the bladder) and cause infection.   Place a small amount of sterile lubricant at the tip of the penis where the catheter is entering.  Taking Care of the Drainage Bags  Two drainage bags may be taken home: a large overnight drainage bag, and a smaller leg bag which fits underneath clothing.   It is okay to wear the overnight bag at any time, but NEVER wear the smaller leg bag at night.   Keep the drainage bag well below the level of your bladder. This prevents backflow of urine into the bladder and allows the urine to drain freely.   Anchor the tubing to your leg to prevent pulling or tension on the catheter. Use tape or a leg strap provided by the hospital.   Empty the drainage bag when it is 1/2 to 3/4 full. Wash your hands before and after touching the bag.   Periodically check the tubing for kinks to make sure there is no pressure on the tubing which could restrict the flow of urine.  Changing the Drainage Bags  Cleanse both ends of the clean bag with alcohol before changing.   Pinch off the rubber catheter to  avoid urine spillage during the disconnection.   Disconnect the dirty bag and connect the clean one.   Empty the dirty bag carefully to avoid a urine spill.   Attach the new bag to the leg with tape or a leg strap.  Cleaning the Drainage Bags  Whenever a drainage bag is disconnected, it must be cleaned quickly so it is ready for the next use.   Wash the bag in warm, soapy water.   Rinse the bag thoroughly with warm water.   Soak the bag for 30 minutes in a solution of white vinegar and water (1 cup vinegar to 1 quart warm water).   Rinse with warm water.  SEEK MEDICAL CARE IF:   You have chills or night sweats.   You are leaking around your catheter or  have problems with your catheter. It is not uncommon to have sporadic leakage around your catheter as a result of bladder spasms. If the leakage stops, there is not much need for concern. If you are uncertain, call your caregiver.   You develop side effects that you think are coming from your medicines.  SEEK IMMEDIATE MEDICAL CARE IF:   You are suddenly unable to urinate. Check to see if there are any kinks in the drainage tubing that may cause this. If you cannot find any kinks, call your caregiver immediately. This is an emergency.   You develop shortness of breath or chest pains.   Bleeding persists or clots develop in your urine.   You have a fever.   You develop pain in your back or over your lower belly (abdomen).   You develop pain or swelling in your legs.   Any problems you are having get worse rather than better.  MAKE SURE YOU:   Understand these instructions.   Will watch your condition.   Will get help right away if you are not doing well or get worse.     Post Anesthesia Home Care Instructions  Activity: Get plenty of rest for the remainder of the day. A responsible individual must stay with you for 24 hours following the procedure.  For the next 24 hours, DO NOT: -Drive a car -Paediatric nurse -Drink alcoholic beverages -Take any medication unless instructed by your physician -Make any legal decisions or sign important papers.  Meals: Start with liquid foods such as gelatin or soup. Progress to regular foods as tolerated. Avoid greasy, spicy, heavy foods. If nausea and/or vomiting occur, drink only clear liquids until the nausea and/or vomiting subsides. Call your physician if vomiting continues.  Special Instructions/Symptoms: Your throat may feel dry or sore from the anesthesia or the breathing tube placed in your throat during surgery. If this causes discomfort, gargle with warm salt water. The discomfort should disappear within 24 hours.  Do not take  any Tylenol until after 5:45 pm today.

## 2020-03-21 NOTE — Telephone Encounter (Signed)
Scheduled per 12/09 los, called patient and left a voicemail.

## 2020-03-21 NOTE — Transfer of Care (Signed)
Immediate Anesthesia Transfer of Care Note  Patient: Fabrizio Vandervliet  Procedure(s) Performed: CYSTOSCOPY WITH BILATERAL RETROGRADE PYELOGRAM, LEFT URETEROSCOPY AND LEFT  STENT PLACEMENT (Bilateral Ureter) CYSTOSCOPY WITH BLADDER BIOPSY POSSIBLE LEFT URETERAL BIOPSY (Left Ureter)  Patient Location: PACU  Anesthesia Type:General  Level of Consciousness: drowsy  Airway & Oxygen Therapy: Patient Spontanous Breathing and Patient connected to nasal cannula oxygen  Post-op Assessment: Report given to RN and Post -op Vital signs reviewed and stable  Post vital signs: Reviewed and stable  Last Vitals:  Vitals Value Taken Time  BP 130/80   Temp 98.8   Pulse 78 03/21/20 1506  Resp 16   SpO2 98 % 03/21/20 1506  Vitals shown include unvalidated device data.  Last Pain:  Vitals:   03/21/20 1118  TempSrc: Oral  PainSc: 0-No pain      Patients Stated Pain Goal: 4 (44/61/90 1222)  Complications: No complications documented.

## 2020-03-21 NOTE — Anesthesia Preprocedure Evaluation (Signed)
Anesthesia Evaluation  Patient identified by MRN, date of birth, ID band Patient awake    Reviewed: Allergy & Precautions, NPO status , Patient's Chart, lab work & pertinent test results  Airway Mallampati: II  TM Distance: >3 FB     Dental  (+) Dental Advisory Given   Pulmonary neg pulmonary ROS,    breath sounds clear to auscultation       Cardiovascular negative cardio ROS   Rhythm:Regular Rate:Normal     Neuro/Psych negative neurological ROS     GI/Hepatic negative GI ROS, Neg liver ROS,   Endo/Other  negative endocrine ROS  Renal/GU      Musculoskeletal   Abdominal   Peds  Hematology negative hematology ROS (+)   Anesthesia Other Findings   Reproductive/Obstetrics                             Anesthesia Physical Anesthesia Plan  ASA: II  Anesthesia Plan: General   Post-op Pain Management:    Induction: Intravenous  PONV Risk Score and Plan: 2 and Dexamethasone, Ondansetron and Treatment may vary due to age or medical condition  Airway Management Planned: LMA  Additional Equipment: None  Intra-op Plan:   Post-operative Plan: Extubation in OR  Informed Consent: I have reviewed the patients History and Physical, chart, labs and discussed the procedure including the risks, benefits and alternatives for the proposed anesthesia with the patient or authorized representative who has indicated his/her understanding and acceptance.     Dental advisory given  Plan Discussed with: CRNA  Anesthesia Plan Comments:         Anesthesia Quick Evaluation

## 2020-03-21 NOTE — Op Note (Signed)
Preoperative diagnosis:  1. 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 2. Bilateral hydronephrosis  Postoperative diagnosis:  1. Same  Procedure: 1. Cystoscopy, bilateral retrograde pyelograms with interpretation 2. Left diagnostic ureteroscopy 3. Left ureteral biopsy 4. Bladder biopsy with fulguration, the area involved was approximately 2 cm in size 5. Left ureteral stent placement 6. Foley catheter placement  Surgeon: Ardis Hughs, MD  Anesthesia: General  Complications: None  Intraoperative findings:  #1: The patient had a very high median lobe of the prostate making it difficult to perform cystoscopy with the rigid scope.  I was able to ultimately get the scope in.  I then had difficulty identifying the patient's ureteral orifice ease because of the trabeculations in his bladder. #2: The patient's bladder at the bladder neck and left lateral wall/trigone demonstrated thickening and heaped up mucosa, along with significant trabeculations, heaped up thickened bladder mucosa was different in appearance compared to the other aspects of the patient's bladder.  The left ureteral orifice was emanating within this tissue. #3: The patient's right retrograde pyelogram demonstrated some narrowing at the intramural ureter with mild Hydro of the ureter and a dilated renal pelvis.  The calyces were sharp.  There was no filling defect.  The ureter briskly drained after contrast was instilled. #4: The patient's left retrograde pyelogram demonstrated a narrowing at the intramural ureter with a normal caliber ureter up to just distal to the pelvic inlet are approximately 5 cm from the intravesical ureter.  There was a abrupt transition and narrowing of the ureter with hydroureter and hydroureteronephrosis.  There were no additional filling defects within the renal pelvis. #5: Ureteroscopy of the left distal ureter demonstrated a near obliteration of the stenotic area in the  distal ureter which was very difficult to navigate beyond.  There is no notable tumor, the mucosa was only slightly abnormal.  EBL: Minimal  Specimens:  #1: Right renal pelvis washing for cytology #2: Left renal pelvis washing for cytology #3: Brush biopsy of left ureter for cytology #4: Tissue sampling of the left ureter at the area of the stenosis #5: Bladder biopsies of the abnormal mucosal area of the left trigone  Indication: Edward [name] is a 59 y.o. patient who presented with left lower extremity edema and scrotal swelling.  He is also having some abdominal pain.  He subsequently underwent a CT scan demonstrating diffuse retroperitoneal lymphadenopathy.  There was some thickening of his bladder wall as well.  He presented to clinic for further evaluation.  His urine analysis was completely normal with no evidence microscopic hematuria.  Cystoscopic evaluation demonstrated a large obstructing prostate and significant trabeculations within the bladder.  The bladder mucosa was otherwise fairly normal in appearance.  He subsequently underwent a biopsy by interventional radiology of one of the retroperitoneal lymph nodes this demonstrated poorly differentiated urothelial transitional cell carcinoma.  After reviewing the management options for treatment, he elected to proceed with the above surgical procedure(s). We have discussed the potential benefits and risks of the procedure, side effects of the proposed treatment, the likelihood of the patient achieving the goals of the procedure, and any potential problems that might occur during the procedure or recuperation. Informed consent has been obtained.  Description of procedure:  The patient was taken to the operating room and general anesthesia was induced.  The patient was placed in the dorsal lithotomy position, prepped and draped in the usual sterile fashion, and preoperative antibiotics were administered. A preoperative time-out was performed.  21 French 30 degree cystoscope was gently passed to the patient's urethra and into the bladder under visual guidance.  I then exchanged the 30 degree lens for the 70 degree lens performed under 60 degrees cystoscopic evaluation of the above findings.  I then repassed the 30 degree lens and performed bilateral retrograde pyelograms with the above findings.  I advanced the open-ended catheter up to the right renal pelvis and obtained approximately 20 cc of blood-tinged urine that was sent for cytology.  I advanced a wire up through the patient's left ureteral orifice in the left renal pelvis using fluoroscopic guidance.  I then used a semirigid ureteroscope and advanced to the patient's bladder and was able to cannulate the left ureteral orifice.  I was able to advance the scope with some difficulty up to the level of the stenosis.  Initially I was unable to get beyond this because of the steep angle up over the pelvis, but using a second wire and writing over the initial wire I was able to get up to the lesion and just beyond it.  There was no papillary tumors or appreciable obstructing mucosa.  He was extremely tight.  I did pull back slightly to visualize this area and used a brush biopsy going through this area several times prior to removing it and sending it for cytology.  I subsequently used the flexible ureteral biopsy forceps and was able to get small scant amount of tissue from the area which I also sent for pathology.  Finally I removed the ureteroscope and exchanged the wire for an open-ended catheter and collected approximately 40 cc of blood-tinged urine which I sent for cytology.  Once the renal pelvis seem to be empty I performed a retrograde pyelogram again to ensure there were no filling defects or abnormalities within the left collecting system.  I then exchanged the open-ended catheter for the wire and backloaded the wire through the cystoscope and gently passed cystoscope back into the  patient's bladder under visual guidance.  I then advanced a 26 cm time 6 French double-J ureteral stent over the wire and into the left renal pelvis under fluoroscopic guidance.  Notably it did pass quite easily through the stenosed area.  I then slowly advanced the stent to the bladder neck before removing the wire entirely.  A nice curl was noted both in the bladder as well as the renal pelvis using fluoroscopy.  I then used the rigid biopsy forceps of the cystoscope and biopsied the patient's bladder on the left lateral wall and trigonal region.  I then fulgurated using the Bugbee cautery.  The bladder mucosa was hemostatic.  There was some bleeding of the median lobe of the prostate from the scraping of the cystoscope.  I opted at this point to advance an 42 French coud tip Foley catheter into the patient's bladder as I thought that he would have difficulty urinating following this case.  I inflated the balloon with 10 cc of sterile water.  I inserted a BNO suppositories but distracted.  He subsequently extubated return the PACU in stable condition.  Disposition: The patient will be discharged home with close follow-up.  Ardis Hughs, M.D.

## 2020-03-21 NOTE — Anesthesia Procedure Notes (Signed)
Procedure Name: LMA Insertion Date/Time: 03/21/2020 1:14 PM Performed by: Bonney Aid, CRNA Pre-anesthesia Checklist: Patient identified, Emergency Drugs available, Suction available and Patient being monitored Patient Re-evaluated:Patient Re-evaluated prior to induction Oxygen Delivery Method: Circle system utilized Preoxygenation: Pre-oxygenation with 100% oxygen Induction Type: IV induction Ventilation: Mask ventilation without difficulty LMA: LMA inserted LMA Size: 5.0 Number of attempts: 1 Airway Equipment and Method: Bite block Placement Confirmation: positive ETCO2 Tube secured with: Tape Dental Injury: Teeth and Oropharynx as per pre-operative assessment

## 2020-03-21 NOTE — H&P (Signed)
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 He also had bilateral dilated renal pelvis sees, consistent with extrarenal pelvises.   The patient denies any history of hematuria. He denies any dysuria for worsening frequency/urgency. He has no other real significant past medical history. He has no history of smoking or any exposure to carcinogenic substances. He has no family history of malignancy.   Patient reports that his PSA was checked 2 weeks ago in 0.6.   The patient is [place]. 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     ALLERGIES: No Allergies    MEDICATIONS: Flomax 0.4 mg capsule     GU PSH: None   NON-GU PSH: Cholecystectomy (laparoscopic)     GU PMH: Renal calculus    NON-GU PMH: None   FAMILY HISTORY: No Family History    SOCIAL HISTORY: Marital Status: Married Current Smoking Status: Patient has never smoked.   Tobacco Use Assessment Completed: Used Tobacco in last 30 days? Has never drank.  Drinks 4+ caffeinated drinks per day. 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    REVIEW OF SYSTEMS:    GU Review Male:   Patient denies frequent urination, hard to postpone urination, burning/ pain with urination, get up at night to urinate, leakage of urine, stream starts and stops, trouble starting your stream, have to strain to urinate , erection problems, and penile pain.  Gastrointestinal (Upper):   Patient denies nausea, vomiting, and indigestion/ heartburn.  Gastrointestinal (Lower):   Patient denies diarrhea and constipation.  Constitutional:   Patient denies fever, night sweats, weight loss, and fatigue.  Skin:   Patient denies skin rash/ lesion and itching.  Eyes:   Patient denies blurred vision and double vision.   Ears/ Nose/ Throat:   Patient denies sore throat and sinus problems.  Hematologic/Lymphatic:   Patient denies swollen glands and easy bruising.  Cardiovascular:   Patient reports leg swelling. Patient denies chest pains.  Respiratory:   Patient denies cough and shortness of breath.  Endocrine:   Patient denies excessive thirst.  Musculoskeletal:   Patient reports back pain. Patient denies joint pain.  Neurological:   Patient denies headaches and dizziness.  Psychologic:   Patient denies depression and anxiety.   Notes: Pt c/o scrotal pain.    VITAL SIGNS:      02/20/2020 10:45 AM  Weight 250 lb / 113.4 kg  Height 72 in / 182.88 cm  BP 146/84 mmHg  Heart Rate 88 /min  Temperature 98.7 F / 37.0 C  BMI 33.9 kg/m   GU PHYSICAL EXAMINATION:    Anus and Perineum: No hemorrhoids. No anal stenosis. No rectal fissure, no anal fissure. No edema, no dimple, no perineal tenderness, no anal tenderness.  Scrotum: No lesions. No edema. No cysts. No warts.  Epididymides: Right: no spermatocele, no masses, no cysts, no tenderness, no induration, no enlargement. Left: no spermatocele, no masses, no cysts, no tenderness, no induration, no enlargement.  Testes: No tenderness, no swelling, no enlargement left testes. No tenderness, no swelling, no enlargement right testes. Normal location left testes. Normal location right testes. No mass, no cyst, no varicocele, no hydrocele left testes. No mass, no cyst, no varicocele, no hydrocele right testes.  Urethral Meatus: Normal size. No lesion, no wart, no discharge, no polyp.  Normal location.  Penis: Circumcised, no warts, no cracks. No dorsal Peyronie's plaques, no left corporal Peyronie's plaques, no right corporal Peyronie's plaques, no scarring, no warts. No balanitis, no meatal stenosis.  Prostate: 40 gram or 2+ size. Left lobe normal consistency, right lobe normal consistency. Symmetrical lobes. No prostate nodule. Left lobe no tenderness, right lobe no  tenderness.  Seminal Vesicles: Nonpalpable.  Sphincter Tone: Normal sphincter. No rectal tenderness. No rectal mass.    MULTI-SYSTEM PHYSICAL EXAMINATION:    Constitutional: Well-nourished. No physical deformities. Normally developed. Good grooming.  Neck: Neck symmetrical, not swollen. Normal tracheal position.  Respiratory: No labored breathing, no use of accessory muscles.   Cardiovascular: Normal temperature, normal extremity pulses, no swelling, no varicosities.  Lymphatic: No enlargement of neck, axillae, groin.  Skin: No paleness, no jaundice, no cyanosis. No lesion, no ulcer, no rash.  Neurologic / Psychiatric: Oriented to time, oriented to place, oriented to person. No depression, no anxiety, no agitation.  Gastrointestinal: No mass, no tenderness, no rigidity, non obese abdomen.  Eyes: Normal conjunctivae. Normal eyelids.  Ears, Nose, Mouth, and Throat: Left ear no scars, no lesions, no masses. Right ear no scars, no lesions, no masses. Nose no scars, no lesions, no masses. Normal hearing. Normal lips.  Musculoskeletal: Normal gait and station of head and neck.     Complexity of Data:  Source Of History:  Patient  Urine Test Review:   Urinalysis  X-Ray Review: C.T. Abdomen/Pelvis: Reviewed Films. Discussed With Patient.     PROCEDURES:         Flexible Cystoscopy - 52000  Risks, benefits, and some of the potential complications of the procedure were discussed at length with the patient including infection, bleeding, voiding discomfort, urinary retention, fever, chills, sepsis, and others. All questions were answered. Informed consent was obtained. Sterile technique and intraurethral analgesia were used.  Meatus:  Normal size. Normal location. Normal condition.  Urethra:  No strictures.  External Sphincter:  Normal.  Verumontanum:  Normal.  Prostate:  Obstructing. No hyperplasia.  Bladder Neck:  Non-obstructing.  Ureteral Orifices:  Normal location. Normal size. Normal  shape. Effluxed clear urine.  Bladder:  Moderate trabeculation. No tumors. Normal mucosa. No stones.      The lower urinary tract was carefully examined. The procedure was well-tolerated and without complications. Antibiotic instructions were given. Instructions were given to call the office immediately for bloody urine, difficulty urinating, urinary retention, painful or frequent urination, fever, chills, nausea, vomiting or other illness. The patient stated that he understood these instructions and would comply with them.         Urinalysis - 81003 Dipstick Dipstick Cont'd  Specimen: Voided Bilirubin: Neg  Color: Yellow Ketones: Neg  Appearance: Clear Blood: Neg  Specific Gravity: 1.025 Protein: Neg  pH: 6.5 Urobilinogen: 0.2  Glucose: Neg Nitrites: Neg    Leukocyte Esterase: Neg    ASSESSMENT:      ICD-10 Details  1 NON-GU:   Enlarged lymph nodes, unspecified - R59.9    PLAN:           Orders X-Rays: Interventional Radiology With and Without I.V. Contrast - Enlarges retroperitoneal lymph nodes, biopsy easiest spot, please!          Schedule Return Visit/Planned Activity: Return PRN - Office Visit          Document Letter(s):  Created for Patient: Clinical Summary         Notes:   Cystoscopy was unremarkable except for a thickening trabeculated bladder.  His rectal exam was normal. His scrotal exam was also normal.    The patient's GU evaluation was largely unremarkable. Instead, I am going to refer him to interventional Radiology for a biopsy of the lymph nodes. There are many knows to choose from, I have recommended that they biopsy the easiest 1.

## 2020-03-22 ENCOUNTER — Encounter: Payer: Self-pay | Admitting: Oncology

## 2020-03-22 ENCOUNTER — Encounter (HOSPITAL_BASED_OUTPATIENT_CLINIC_OR_DEPARTMENT_OTHER): Payer: Self-pay | Admitting: Urology

## 2020-03-22 ENCOUNTER — Other Ambulatory Visit: Payer: 59

## 2020-03-22 ENCOUNTER — Inpatient Hospital Stay: Payer: 59

## 2020-03-22 LAB — CYTOLOGY - NON PAP

## 2020-03-22 LAB — SURGICAL PATHOLOGY

## 2020-03-22 NOTE — Progress Notes (Signed)
Met with patient in lobby to introduce myself as Financial Resource Specialist and to offer available resources. ° °Discussed one-time $1000 [employer] grant and qualifications to assist with personal expenses while going through treatment. ° °Gave him my card if interested in applying and for any additional financial questions or concerns. °

## 2020-03-23 NOTE — Anesthesia Postprocedure Evaluation (Signed)
Anesthesia Post Note  Patient: Edward [name]  Procedure(s) Performed: CYSTOSCOPY WITH BILATERAL RETROGRADE PYELOGRAM, LEFT URETEROSCOPY AND LEFT  STENT PLACEMENT (Bilateral Ureter) CYSTOSCOPY WITH BLADDER BIOPSY POSSIBLE LEFT URETERAL BIOPSY (Left Ureter)     Patient location during evaluation: PACU Anesthesia Type: General Level of consciousness: awake and alert Pain management: pain level controlled Vital Signs Assessment: post-procedure vital signs reviewed and stable Respiratory status: spontaneous breathing, nonlabored ventilation, respiratory function stable and patient connected to nasal cannula oxygen Cardiovascular status: blood pressure returned to baseline and stable Postop Assessment: no apparent nausea or vomiting Anesthetic complications: no   No complications documented.  Last Vitals:  Vitals:   03/21/20 1645 03/21/20 1745  BP: (!) 149/88 137/73  Pulse: 76 74  Resp: 14 16  Temp:  36.8 C  SpO2: 95% 95%    Last Pain:  Vitals:   03/21/20 1745  TempSrc:   PainSc: 4                  Tiajuana Amass

## 2020-03-26 ENCOUNTER — Telehealth: Payer: Self-pay | Admitting: Oncology

## 2020-03-26 NOTE — Telephone Encounter (Signed)
Called patient regarding upcoming appointments. Patient is notified. 

## 2020-03-27 ENCOUNTER — Inpatient Hospital Stay: Payer: 59

## 2020-03-27 ENCOUNTER — Other Ambulatory Visit: Payer: Self-pay | Admitting: Oncology

## 2020-03-27 ENCOUNTER — Other Ambulatory Visit: Payer: Self-pay

## 2020-03-27 VITALS — BP 111/74 | HR 64 | Temp 99.1°F | Resp 17

## 2020-03-27 DIAGNOSIS — C679 Malignant neoplasm of bladder, unspecified: Secondary | ICD-10-CM

## 2020-03-27 DIAGNOSIS — N1330 Unspecified hydronephrosis: Secondary | ICD-10-CM | POA: Diagnosis not present

## 2020-03-27 DIAGNOSIS — Z5111 Encounter for antineoplastic chemotherapy: Secondary | ICD-10-CM | POA: Diagnosis not present

## 2020-03-27 DIAGNOSIS — Z79899 Other long term (current) drug therapy: Secondary | ICD-10-CM | POA: Diagnosis not present

## 2020-03-27 DIAGNOSIS — R590 Localized enlarged lymph nodes: Secondary | ICD-10-CM | POA: Diagnosis not present

## 2020-03-27 LAB — CMP (CANCER CENTER ONLY)
ALT: 15 U/L (ref 0–44)
AST: 15 U/L (ref 15–41)
Albumin: 3.6 g/dL (ref 3.5–5.0)
Alkaline Phosphatase: 52 U/L (ref 38–126)
Anion gap: 8 (ref 5–15)
BUN: 21 mg/dL — ABNORMAL HIGH (ref 6–20)
CO2: 26 mmol/L (ref 22–32)
Calcium: 9.3 mg/dL (ref 8.9–10.3)
Chloride: 102 mmol/L (ref 98–111)
Creatinine: 1.25 mg/dL — ABNORMAL HIGH (ref 0.61–1.24)
GFR, Estimated: >60 mL/min (ref >60)
Glucose, Bld: 143 mg/dL — ABNORMAL HIGH (ref 70–99)
Potassium: 3.8 mmol/L (ref 3.5–5.1)
Sodium: 136 mmol/L (ref 135–145)
Total Bilirubin: 0.5 mg/dL (ref 0.3–1.2)
Total Protein: 7.2 g/dL (ref 6.5–8.1)

## 2020-03-27 LAB — CBC WITH DIFFERENTIAL (CANCER CENTER ONLY)
Abs Immature Granulocytes: 0.04 10*3/uL (ref 0.00–0.07)
Basophils Absolute: 0.1 10*3/uL (ref 0.0–0.1)
Basophils Relative: 1 %
Eosinophils Absolute: 0.3 10*3/uL (ref 0.0–0.5)
Eosinophils Relative: 3 %
HCT: 37.5 % — ABNORMAL LOW (ref 39.0–52.0)
Hemoglobin: 12.1 g/dL — ABNORMAL LOW (ref 13.0–17.0)
Immature Granulocytes: 0 %
Lymphocytes Relative: 13 %
Lymphs Abs: 1.4 10*3/uL (ref 0.7–4.0)
MCH: 26.8 pg (ref 26.0–34.0)
MCHC: 32.3 g/dL (ref 30.0–36.0)
MCV: 83.1 fL (ref 80.0–100.0)
Monocytes Absolute: 0.8 10*3/uL (ref 0.1–1.0)
Monocytes Relative: 8 %
Neutro Abs: 8.1 10*3/uL — ABNORMAL HIGH (ref 1.7–7.7)
Neutrophils Relative %: 75 %
Platelet Count: 248 10*3/uL (ref 150–400)
RBC: 4.51 MIL/uL (ref 4.22–5.81)
RDW: 12.4 % (ref 11.5–15.5)
WBC Count: 10.7 10*3/uL — ABNORMAL HIGH (ref 4.0–10.5)
nRBC: 0 % (ref 0.0–0.2)

## 2020-03-27 MED ORDER — SODIUM CHLORIDE 0.9 % IV SOLN
Freq: Once | INTRAVENOUS | Status: DC
  Filled 2020-03-27: qty 250

## 2020-03-27 MED ORDER — PALONOSETRON HCL INJECTION 0.25 MG/5ML
INTRAVENOUS | Status: AC
  Filled 2020-03-27: qty 5

## 2020-03-27 MED ORDER — SODIUM CHLORIDE 0.9 % IV SOLN
10.0000 mg | Freq: Once | INTRAVENOUS | Status: AC
  Administered 2020-03-27: 12:00:00 10 mg via INTRAVENOUS
  Filled 2020-03-27: qty 10

## 2020-03-27 MED ORDER — SODIUM CHLORIDE 0.9 % IV SOLN
Freq: Once | INTRAVENOUS | Status: AC
  Filled 2020-03-27: qty 10

## 2020-03-27 MED ORDER — SODIUM CHLORIDE 0.9 % IV SOLN
150.0000 mg | Freq: Once | INTRAVENOUS | Status: AC
  Administered 2020-03-27: 12:00:00 150 mg via INTRAVENOUS
  Filled 2020-03-27: qty 150

## 2020-03-27 MED ORDER — SODIUM CHLORIDE 0.9 % IV SOLN
2400.0000 mg | Freq: Once | INTRAVENOUS | Status: AC
  Administered 2020-03-27: 13:00:00 2400 mg via INTRAVENOUS
  Filled 2020-03-27: qty 52.6

## 2020-03-27 MED ORDER — PALONOSETRON HCL INJECTION 0.25 MG/5ML
0.2500 mg | Freq: Once | INTRAVENOUS | Status: AC
  Administered 2020-03-27: 11:00:00 0.25 mg via INTRAVENOUS

## 2020-03-27 MED ORDER — SODIUM CHLORIDE 0.9 % IV SOLN
70.0000 mg/m2 | Freq: Once | INTRAVENOUS | Status: AC
  Administered 2020-03-27: 14:00:00 165 mg via INTRAVENOUS
  Filled 2020-03-27: qty 165

## 2020-03-27 MED ORDER — SODIUM CHLORIDE 0.9 % IV SOLN
Freq: Once | INTRAVENOUS | Status: AC
  Filled 2020-03-27: qty 250

## 2020-03-27 NOTE — Progress Notes (Signed)
While patient was receiving emend as a pre med, he c/o severe pain in his lower back, radiating down his legs. Emend was stopped and saline started. Patient got up and walked and pain went away after about 15 minutes. Per lisa in pharmacy there was one documented case she was able to find on this. Emend started back at a slower rate and patient tolerated well for the remainder of the medication. No further complaints.

## 2020-03-27 NOTE — Progress Notes (Signed)
Pt discharged in no apparent distress. Pt left ambulatory without assistance. Pt aware of discharge instructions and verbalized understanding and had no further questions.  

## 2020-03-27 NOTE — Patient Instructions (Addendum)
[facility] Discharge Instructions for Patients Receiving Chemotherapy  Today you received the following chemotherapy agents Gemzar and Cisplatin  To help prevent nausea and vomiting after your treatment, we encourage you to take your nausea medication as directed   If you develop nausea and vomiting that is not controlled by your nausea medication, call the clinic.   BELOW ARE SYMPTOMS THAT SHOULD BE REPORTED IMMEDIATELY:  *FEVER GREATER THAN 100.5 F  *CHILLS WITH OR WITHOUT FEVER  NAUSEA AND VOMITING THAT IS NOT CONTROLLED WITH YOUR NAUSEA MEDICATION  *UNUSUAL SHORTNESS OF BREATH  *UNUSUAL BRUISING OR BLEEDING  TENDERNESS IN MOUTH AND THROAT WITH OR WITHOUT PRESENCE OF ULCERS  *URINARY PROBLEMS  *BOWEL PROBLEMS  UNUSUAL RASH Items with * indicate a potential emergency and should be followed up as soon as possible.  Feel free to call the clinic should you have any questions or concerns. The clinic phone number is [phone].  Please show the [facility] at check-in to the Emergency Department and triage nurse.  Gemcitabine injection What is this medicine? GEMCITABINE (jem SYE ta been) is a chemotherapy drug. This medicine is used to treat many types of cancer like breast cancer, lung cancer, pancreatic cancer, and ovarian cancer. This medicine may be used for other purposes; ask your health care provider or pharmacist if you have questions. COMMON BRAND NAME(S): Gemzar, Infugem What should I tell my health care provider before I take this medicine? They need to know if you have any of these conditions: blood disorders infection kidney disease liver disease lung or breathing disease, like asthma recent or ongoing radiation therapy an unusual or allergic reaction to gemcitabine, other chemotherapy, other medicines, foods, dyes, or preservatives pregnant or trying to get pregnant breast-feeding How should I use this medicine? This drug is given  as an infusion into a vein. It is administered in a hospital or clinic by a specially trained health care professional. Talk to your pediatrician regarding the use of this medicine in children. Special care may be needed. Overdosage: If you think you have taken too much of this medicine contact a poison control center or emergency room at once. NOTE: This medicine is only for you. Do not share this medicine with others. What if I miss a dose? It is important not to miss your dose. Call your doctor or health care professional if you are unable to keep an appointment. What may interact with this medicine? medicines to increase blood counts like filgrastim, pegfilgrastim, sargramostim some other chemotherapy drugs like cisplatin vaccines Talk to your doctor or health care professional before taking any of these medicines: acetaminophen aspirin ibuprofen ketoprofen naproxen This list may not describe all possible interactions. Give your health care provider a list of all the medicines, herbs, non-prescription drugs, or dietary supplements you use. Also tell them if you smoke, drink alcohol, or use illegal drugs. Some items may interact with your medicine. What should I watch for while using this medicine? Visit your doctor for checks on your progress. This drug may make you feel generally unwell. This is not uncommon, as chemotherapy can affect healthy cells as well as cancer cells. Report any side effects. Continue your course of treatment even though you feel ill unless your doctor tells you to stop. In some cases, you may be given additional medicines to help with side effects. Follow all directions for their use. Call your doctor or health care professional for advice if you get a fever, chills or  sore throat, or other symptoms of a cold or flu. Do not treat yourself. This drug decreases your body's ability to fight infections. Try to avoid being around people who are sick. This medicine may  increase your risk to bruise or bleed. Call your doctor or health care professional if you notice any unusual bleeding. Be careful brushing and flossing your teeth or using a toothpick because you may get an infection or bleed more easily. If you have any dental work done, tell your dentist you are receiving this medicine. Avoid taking products that contain aspirin, acetaminophen, ibuprofen, naproxen, or ketoprofen unless instructed by your doctor. These medicines may hide a fever. Do not become pregnant while taking this medicine or for 6 months after stopping it. Women should inform their doctor if they wish to become pregnant or think they might be pregnant. Men should not father a child while taking this medicine and for 3 months after stopping it. There is a potential for serious side effects to an unborn child. Talk to your health care professional or pharmacist for more information. Do not breast-feed an infant while taking this medicine or for at least 1 week after stopping it. Men should inform their doctors if they wish to father a child. This medicine may lower sperm counts. Talk with your doctor or health care professional if you are concerned about your fertility. What side effects may I notice from receiving this medicine? Side effects that you should report to your doctor or health care professional as soon as possible: allergic reactions like skin rash, itching or hives, swelling of the face, lips, or tongue breathing problems pain, redness, or irritation at site where injected signs and symptoms of a dangerous change in heartbeat or heart rhythm like chest pain; dizziness; fast or irregular heartbeat; palpitations; feeling faint or lightheaded, falls; breathing problems signs of decreased platelets or bleeding - bruising, pinpoint red spots on the skin, black, tarry stools, blood in the urine signs of decreased red blood cells - unusually weak or tired, feeling faint or lightheaded,  falls signs of infection - fever or chills, cough, sore throat, pain or difficulty passing urine signs and symptoms of kidney injury like trouble passing urine or change in the amount of urine signs and symptoms of liver injury like dark yellow or brown urine; general ill feeling or flu-like symptoms; light-colored stools; loss of appetite; nausea; right upper belly pain; unusually weak or tired; yellowing of the eyes or skin swelling of ankles, feet, hands Side effects that usually do not require medical attention (report to your doctor or health care professional if they continue or are bothersome): constipation diarrhea hair loss loss of appetite nausea rash vomiting This list may not describe all possible side effects. Call your doctor for medical advice about side effects. You may report side effects to FDA at 1-800-FDA-1088. Where should I keep my medicine? This drug is given in a hospital or clinic and will not be stored at home. NOTE: This sheet is a summary. It may not cover all possible information. If you have questions about this medicine, talk to your doctor, pharmacist, or health care provider.  2020 Elsevier/Gold Standard (2017-06-17 18:06:11) Cisplatin injection What is this medicine? CISPLATIN (SIS pla tin) is a chemotherapy drug. It targets fast dividing cells, like cancer cells, and causes these cells to die. This medicine is used to treat many types of cancer like bladder, ovarian, and testicular cancers. This medicine may be used for other purposes; ask  your health care provider or pharmacist if you have questions. COMMON BRAND NAME(S): Platinol, Platinol -AQ What should I tell my health care provider before I take this medicine? They need to know if you have any of these conditions: eye disease, vision problems hearing problems kidney disease low blood counts, like white cells, platelets, or red blood cells tingling of the fingers or toes, or other nerve  disorder an unusual or allergic reaction to cisplatin, carboplatin, oxaliplatin, other medicines, foods, dyes, or preservatives pregnant or trying to get pregnant breast-feeding How should I use this medicine? This drug is given as an infusion into a vein. It is administered in a hospital or clinic by a specially trained health care professional. Talk to your pediatrician regarding the use of this medicine in children. Special care may be needed. Overdosage: If you think you have taken too much of this medicine contact a poison control center or emergency room at once. NOTE: This medicine is only for you. Do not share this medicine with others. What if I miss a dose? It is important not to miss a dose. Call your doctor or health care professional if you are unable to keep an appointment. What may interact with this medicine? This medicine may interact with the following medications: foscarnet certain antibiotics like amikacin, gentamicin, neomycin, polymyxin B, streptomycin, tobramycin, vancomycin This list may not describe all possible interactions. Give your health care provider a list of all the medicines, herbs, non-prescription drugs, or dietary supplements you use. Also tell them if you smoke, drink alcohol, or use illegal drugs. Some items may interact with your medicine. What should I watch for while using this medicine? Your condition will be monitored carefully while you are receiving this medicine. You will need important blood work done while you are taking this medicine. This drug may make you feel generally unwell. This is not uncommon, as chemotherapy can affect healthy cells as well as cancer cells. Report any side effects. Continue your course of treatment even though you feel ill unless your doctor tells you to stop. This medicine may increase your risk of getting an infection. Call your healthcare professional for advice if you get a fever, chills, or sore throat, or other  symptoms of a cold or flu. Do not treat yourself. Try to avoid being around people who are sick. Avoid taking medicines that contain aspirin, acetaminophen, ibuprofen, naproxen, or ketoprofen unless instructed by your healthcare professional. These medicines may hide a fever. This medicine may increase your risk to bruise or bleed. Call your doctor or health care professional if you notice any unusual bleeding. Be careful brushing and flossing your teeth or using a toothpick because you may get an infection or bleed more easily. If you have any dental work done, tell your dentist you are receiving this medicine. Do not become pregnant while taking this medicine or for 14 months after stopping it. Women should inform their healthcare professional if they wish to become pregnant or think they might be pregnant. Men should not father a child while taking this medicine and for 11 months after stopping it. There is potential for serious side effects to an unborn child. Talk to your healthcare professional for more information. Do not breast-feed an infant while taking this medicine. This medicine has caused ovarian failure in some women. This medicine may make it more difficult to get pregnant. Talk to your healthcare professional if you are concerned about your fertility. This medicine has caused decreased sperm counts  in some men. This may make it more difficult to father a child. Talk to your healthcare professional if you are concerned about your fertility. Drink fluids as directed while you are taking this medicine. This will help protect your kidneys. Call your doctor or health care professional if you get diarrhea. Do not treat yourself. What side effects may I notice from receiving this medicine? Side effects that you should report to your doctor or health care professional as soon as possible: allergic reactions like skin rash, itching or hives, swelling of the face, lips, or tongue blurred  vision changes in vision decreased hearing or ringing of the ears nausea, vomiting pain, redness, or irritation at site where injected pain, tingling, numbness in the hands or feet signs and symptoms of bleeding such as bloody or black, tarry stools; red or dark brown urine; spitting up blood or brown material that looks like coffee grounds; red spots on the skin; unusual bruising or bleeding from the eyes, gums, or nose signs and symptoms of infection like fever; chills; cough; sore throat; pain or trouble passing urine signs and symptoms of kidney injury like trouble passing urine or change in the amount of urine signs and symptoms of low red blood cells or anemia such as unusually weak or tired; feeling faint or lightheaded; falls; breathing problems Side effects that usually do not require medical attention (report to your doctor or health care professional if they continue or are bothersome): loss of appetite mouth sores muscle cramps This list may not describe all possible side effects. Call your doctor for medical advice about side effects. You may report side effects to FDA at 1-800-FDA-1088. Where should I keep my medicine? This drug is given in a hospital or clinic and will not be stored at home. NOTE: This sheet is a summary. It may not cover all possible information. If you have questions about this medicine, talk to your doctor, pharmacist, or health care provider.  2020 Elsevier/Gold Standard (2018-03-19 15:59:17) Aprepitant Emulsion for injection What is this medicine? APREPITANT (ap RE pi tant) is used together with other medicines to prevent and treat nausea and vomiting caused by cancer treatment (chemotherapy). This medicine may be used for other purposes; ask your health care provider or pharmacist if you have questions. COMMON BRAND NAME(S): CINVANTI What should I tell my health care provider before I take this medicine? They need to know if you have any of these  conditions:  liver disease  an unusual or allergic reaction to aprepitant, fosaprepitant, other medicines, foods, dyes, or preservatives  pregnant or trying to get pregnant  breast-feeding How should I use this medicine? This medicine is for injection or infusion into a vein. It is given by a health care professional in a hospital or clinic setting. Talk to your pediatrician regarding the use of this medicine in children. Special care may be needed. Overdosage: If you think you have taken too much of this medicine contact a poison control center or emergency room at once. NOTE: This medicine is only for you. Do not share this medicine with others. What if I miss a dose? This does not apply. What may interact with this medicine? Do not take this medicine with any of these medicines:  cisapride  flibanserin  lomitapide  pimozide This medicine may also interact with the following medications:  diltiazem  male hormones, like estrogens or progestins and birth control pills  medicines for fungal infections like ketoconazole and itraconazole  medicines for HIV  medicines for seizures or to control epilepsy like carbamazepine or phenytoin  medicines used for sleep or anxiety disorders like alprazolam, diazepam, or midazolam  nefazodone  paroxetine  ranolazine  rifampin  some chemotherapy medications like etoposide, ifosfamide, vinblastine, vincristine  some antibiotics like clarithromycin, erythromycin, troleandomycin  steroid medicines like dexamethasone or methylprednisolone  tolbutamide  warfarin This list may not describe all possible interactions. Give your health care provider a list of all the medicines, herbs, non-prescription drugs, or dietary supplements you use. Also tell them if you smoke, drink alcohol, or use illegal drugs. Some items may interact with your medicine. What should I watch for while using this medicine? Do not take this medicine if you  already have nausea and vomiting. Ask your health care provider what to do if you already have nausea. Birth control pills and other methods of hormonal contraception (for example, IUD or patch) may not work properly while you are taking this medicine. Use an extra method of birth control during treatment and for 1 month after your last dose of aprepitant. This medicine should not be used continuously for a long time. Visit your doctor or health care professional for regular check-ups. This medicine may change your liver function blood test results. What side effects may I notice from receiving this medicine? Side effects that you should report to your doctor or health care professional as soon as possible:  allergic reactions like skin rash, itching or hives, swelling of the face, lips, or tongue  breathing problems  changes in heart rhythm  high or low blood pressure  pain, redness, or irritation at site where injected  rectal bleeding  serious dizziness or disorientation, confusion  sharp or severe stomach pain  sharp pain in your leg Side effects that usually do not require medical attention (report these to your doctor or health care professional if they continue or are bothersome):  constipation or diarrhea  hair loss  headache  hiccups  loss of appetite  nausea  upset stomach  tiredness This list may not describe all possible side effects. Call your doctor for medical advice about side effects. You may report side effects to FDA at 1-800-FDA-1088. Where should I keep my medicine? This drug is given in a hospital or clinic and will not be stored at home. NOTE: This sheet is a summary. It may not cover all possible information. If you have questions about this medicine, talk to your doctor, pharmacist, or health care provider.  2020 Elsevier/Gold Standard (2018-01-28 16:40:43) Dexamethasone injection What is this medicine? DEXAMETHASONE (dex a METH a sone) is a  corticosteroid. It is used to treat inflammation of the skin, joints, lungs, and other organs. Common conditions treated include asthma, allergies, and arthritis. It is also used for other conditions, like blood disorders and diseases of the adrenal glands. This medicine may be used for other purposes; ask your health care provider or pharmacist if you have questions. COMMON BRAND NAME(S): Decadron, DoubleDex, Simplist Dexamethasone, Solurex What should I tell my health care provider before I take this medicine? They need to know if you have any of these conditions:  Cushing's syndrome  diabetes  glaucoma  heart disease  high blood pressure  infection like herpes, measles, tuberculosis, or chickenpox  kidney disease  liver disease  mental illness  myasthenia gravis  osteoporosis  previous heart attack  seizures  stomach or intestine problems  thyroid disease  an unusual or allergic reaction to dexamethasone, corticosteroids, other medicines, lactose, foods, dyes,  or preservatives  pregnant or trying to get pregnant  breast-feeding How should I use this medicine? This medicine is for injection into a muscle, joint, lesion, soft tissue, or vein. It is given by a health care professional in a hospital or clinic setting. Talk to your pediatrician regarding the use of this medicine in children. Special care may be needed. Overdosage: If you think you have taken too much of this medicine contact a poison control center or emergency room at once. NOTE: This medicine is only for you. Do not share this medicine with others. What if I miss a dose? This may not apply. If you are having a series of injections over a prolonged period, try not to miss an appointment. Call your doctor or health care professional to reschedule if you are unable to keep an appointment. What may interact with this medicine? Do not take this medicine with any of the following medications:  live virus  vaccines This medicine may also interact with the following medications:  aminoglutethimide  amphotericin B  aspirin and aspirin-like medicines  certain antibiotics like erythromycin, clarithromycin, and troleandomycin  certain antivirals for HIV or hepatitis  certain medicines for seizures like carbamazepine, phenobarbital, phenytoin  certain medicines to treat myasthenia gravis  cholestyramine  cyclosporine  digoxin  diuretics  ephedrine  male hormones, like estrogen or progestins and birth control pills  insulin or other medicines for diabetes  isoniazid  ketoconazole  medicines that relax muscles for surgery  mifepristone  NSAIDs, medicines for pain and inflammation, like ibuprofen or naproxen  rifampin  skin tests for allergies  thalidomide  vaccines  warfarin This list may not describe all possible interactions. Give your health care provider a list of all the medicines, herbs, non-prescription drugs, or dietary supplements you use. Also tell them if you smoke, drink alcohol, or use illegal drugs. Some items may interact with your medicine. What should I watch for while using this medicine? Visit your health care professional for regular checks on your progress. Tell your health care professional if your symptoms do not start to get better or if they get worse. Your condition will be monitored carefully while you are receiving this medicine. Wear a medical ID bracelet or chain. Carry a card that describes your disease and details of your medicine and dosage times. This medicine may increase your risk of getting an infection. Call your health care professional for advice if you get a fever, chills, or sore throat, or other symptoms of a cold or flu. Do not treat yourself. Try to avoid being around people who are sick. Call your health care professional if you are around anyone with measles, chickenpox, or if you develop sores or blisters that do not heal  properly. If you are going to need surgery or other procedures, tell your doctor or health care professional that you have taken this medicine within the last 12 months. Ask your doctor or health care professional about your diet. You may need to lower the amount of salt you eat. This medicine may increase blood sugar. Ask your healthcare provider if changes in diet or medicines are needed if you have diabetes. What side effects may I notice from receiving this medicine? Side effects that you should report to your doctor or health care professional as soon as possible:  allergic reactions like skin rash, itching or hives, swelling of the face, lips, or tongue  bloody or black, tarry stools  changes in emotions or moods  changes  in vision  confusion, excitement, restlessness  depressed mood  eye pain  hallucinations  muscle weakness  severe or sudden stomach or belly pain  signs and symptoms of high blood sugar such as being more thirsty or hungry or having to urinate more than normal. You may also feel very tired or have blurry vision.  signs and symptoms of infection like fever; chills; cough; sore throat; pain or trouble passing urine  swelling of ankles, feet  unusual bruising or bleeding  wounds that do not heal Side effects that usually do not require medical attention (report to your doctor or health care professional if they continue or are bothersome):  increased appetite  increased growth of face or body hair  headache  nausea, vomiting  pain, redness, or irritation at site where injected  skin problems, acne, thin and shiny skin  trouble sleeping  weight gain This list may not describe all possible side effects. Call your doctor for medical advice about side effects. You may report side effects to FDA at 1-800-FDA-1088. Where should I keep my medicine? This medicine is given in a hospital or clinic and will not be stored at home. NOTE: This sheet is a  summary. It may not cover all possible information. If you have questions about this medicine, talk to your doctor, pharmacist, or health care provider.  2020 Elsevier/Gold Standard (2018-10-05 13:51:58) Palonosetron Injection What is this medicine? PALONOSETRON (pal oh NOE se tron) is used to prevent nausea and vomiting caused by chemotherapy. It also helps prevent delayed nausea and vomiting that may occur a few days after your treatment. This medicine may be used for other purposes; ask your health care provider or pharmacist if you have questions. COMMON BRAND NAME(S): Aloxi What should I tell my health care provider before I take this medicine? They need to know if you have any of these conditions:  an unusual or allergic reaction to palonosetron, dolasetron, granisetron, ondansetron, other medicines, foods, dyes, or preservatives  pregnant or trying to get pregnant  breast-feeding How should I use this medicine? This medicine is for infusion into a vein. It is given by a health care professional in a hospital or clinic setting. Talk to your pediatrician regarding the use of this medicine in children. While this drug may be prescribed for children as young as 1 month for selected conditions, precautions do apply. Overdosage: If you think you have taken too much of this medicine contact a poison control center or emergency room at once. NOTE: This medicine is only for you. Do not share this medicine with others. What if I miss a dose? This does not apply. What may interact with this medicine?  certain medicines for depression, anxiety, or psychotic disturbances  fentanyl  linezolid  MAOIs like Carbex, Eldepryl, Marplan, Nardil, and Parnate  methylene blue (injected into a vein)  tramadol This list may not describe all possible interactions. Give your health care provider a list of all the medicines, herbs, non-prescription drugs, or dietary supplements you use. Also tell them  if you smoke, drink alcohol, or use illegal drugs. Some items may interact with your medicine. What should I watch for while using this medicine? Your condition will be monitored carefully while you are receiving this medicine. What side effects may I notice from receiving this medicine? Side effects that you should report to your doctor or health care professional as soon as possible:  allergic reactions like skin rash, itching or hives, swelling of the face, lips,  or tongue  breathing problems  confusion  dizziness  fast, irregular heartbeat  fever and chills  loss of balance or coordination  seizures  sweating  swelling of the hands and feet  tremors  unusually weak or tired Side effects that usually do not require medical attention (report to your doctor or health care professional if they continue or are bothersome):  constipation or diarrhea  headache This list may not describe all possible side effects. Call your doctor for medical advice about side effects. You may report side effects to FDA at 1-800-FDA-1088. Where should I keep my medicine? This drug is given in a hospital or clinic and will not be stored at home. NOTE: This sheet is a summary. It may not cover all possible information. If you have questions about this medicine, talk to your doctor, pharmacist, or health care provider.  2020 Elsevier/Gold Standard (2013-01-28 10:38:36)

## 2020-04-02 ENCOUNTER — Telehealth: Payer: Self-pay | Admitting: Oncology

## 2020-04-02 NOTE — Telephone Encounter (Signed)
Scheduled follow-up appointments per 12/27 schedule message. Patient is aware. Patient also wondered if he could reschedule his 12/29 appointment to 12/28 instead. Left message with provider to see.

## 2020-04-03 ENCOUNTER — Other Ambulatory Visit: Payer: Self-pay

## 2020-04-03 ENCOUNTER — Inpatient Hospital Stay: Payer: 59

## 2020-04-03 ENCOUNTER — Encounter (HOSPITAL_COMMUNITY): Payer: Self-pay | Admitting: Oncology

## 2020-04-03 VITALS — BP 141/79 | HR 97 | Temp 98.2°F | Resp 18

## 2020-04-03 DIAGNOSIS — C679 Malignant neoplasm of bladder, unspecified: Secondary | ICD-10-CM

## 2020-04-03 DIAGNOSIS — Z5111 Encounter for antineoplastic chemotherapy: Secondary | ICD-10-CM | POA: Diagnosis not present

## 2020-04-03 LAB — CBC WITH DIFFERENTIAL (CANCER CENTER ONLY)
Abs Immature Granulocytes: 0.02 10*3/uL (ref 0.00–0.07)
Basophils Absolute: 0.1 10*3/uL (ref 0.0–0.1)
Basophils Relative: 1 %
Eosinophils Absolute: 0.2 10*3/uL (ref 0.0–0.5)
Eosinophils Relative: 3 %
HCT: 35.7 % — ABNORMAL LOW (ref 39.0–52.0)
Hemoglobin: 11.5 g/dL — ABNORMAL LOW (ref 13.0–17.0)
Immature Granulocytes: 0 %
Lymphocytes Relative: 16 %
Lymphs Abs: 1.1 10*3/uL (ref 0.7–4.0)
MCH: 26.4 pg (ref 26.0–34.0)
MCHC: 32.2 g/dL (ref 30.0–36.0)
MCV: 81.9 fL (ref 80.0–100.0)
Monocytes Absolute: 0.4 10*3/uL (ref 0.1–1.0)
Monocytes Relative: 6 %
Neutro Abs: 5 10*3/uL (ref 1.7–7.7)
Neutrophils Relative %: 74 %
Platelet Count: 175 10*3/uL (ref 150–400)
RBC: 4.36 MIL/uL (ref 4.22–5.81)
RDW: 12.3 % (ref 11.5–15.5)
WBC Count: 6.8 10*3/uL (ref 4.0–10.5)
nRBC: 0 % (ref 0.0–0.2)

## 2020-04-03 LAB — CMP (CANCER CENTER ONLY)
ALT: 16 U/L (ref 0–44)
AST: 16 U/L (ref 15–41)
Albumin: 3.3 g/dL — ABNORMAL LOW (ref 3.5–5.0)
Alkaline Phosphatase: 57 U/L (ref 38–126)
Anion gap: 9 (ref 5–15)
BUN: 20 mg/dL (ref 6–20)
CO2: 25 mmol/L (ref 22–32)
Calcium: 9.4 mg/dL (ref 8.9–10.3)
Chloride: 105 mmol/L (ref 98–111)
Creatinine: 1.38 mg/dL — ABNORMAL HIGH (ref 0.61–1.24)
GFR, Estimated: 59 mL/min — ABNORMAL LOW (ref >60)
Glucose, Bld: 137 mg/dL — ABNORMAL HIGH (ref 70–99)
Potassium: 4.1 mmol/L (ref 3.5–5.1)
Sodium: 139 mmol/L (ref 135–145)
Total Bilirubin: 0.3 mg/dL (ref 0.3–1.2)
Total Protein: 7.6 g/dL (ref 6.5–8.1)

## 2020-04-03 MED ORDER — ONDANSETRON HCL 8 MG PO TABS: 8.0000 mg | ORAL_TABLET | Freq: Three times a day (TID) | ORAL | 1 refills | Status: DC | PRN

## 2020-04-03 MED ORDER — SODIUM CHLORIDE 0.9 % IV SOLN
1000.0000 mg/m2 | Freq: Once | INTRAVENOUS | Status: AC
  Administered 2020-04-03: 09:00:00 2356 mg via INTRAVENOUS
  Filled 2020-04-03: qty 61.96

## 2020-04-03 MED ORDER — ONDANSETRON HCL 8 MG PO TABS
8.0000 mg | ORAL_TABLET | Freq: Once | ORAL | Status: AC
  Administered 2020-04-03: 09:00:00 8 mg via ORAL

## 2020-04-03 MED ORDER — ONDANSETRON HCL 8 MG PO TABS
ORAL_TABLET | ORAL | Status: AC
  Filled 2020-04-03: qty 1

## 2020-04-03 MED ORDER — SODIUM CHLORIDE 0.9 % IV SOLN
Freq: Once | INTRAVENOUS | Status: AC
  Filled 2020-04-03: qty 250

## 2020-04-03 MED ORDER — SODIUM CHLORIDE 0.9 % IV SOLN
Freq: Once | INTRAVENOUS | Status: DC
  Filled 2020-04-03: qty 250

## 2020-04-03 MED ORDER — PROCHLORPERAZINE MALEATE 10 MG PO TABS: 10.0000 mg | ORAL_TABLET | Freq: Once | ORAL | Status: DC

## 2020-04-03 NOTE — Progress Notes (Signed)
Pt feels that compazine has contributed to his constipation and requests that an alternate pre-medication be given. Dr. Clelia Croft notified and order for Zofran 8mg  po obtained.

## 2020-04-03 NOTE — Patient Instructions (Signed)
[facility] Discharge Instructions for Patients Receiving Chemotherapy  Today you received the following chemotherapy agents Gemcitabine.  To help prevent nausea and vomiting after your treatment, we encourage you to take your nausea medication as directed.  If you develop nausea and vomiting that is not controlled by your nausea medication, call the clinic.   BELOW ARE SYMPTOMS THAT SHOULD BE REPORTED IMMEDIATELY:  *FEVER GREATER THAN 100.5 F  *CHILLS WITH OR WITHOUT FEVER  NAUSEA AND VOMITING THAT IS NOT CONTROLLED WITH YOUR NAUSEA MEDICATION  *UNUSUAL SHORTNESS OF BREATH  *UNUSUAL BRUISING OR BLEEDING  TENDERNESS IN MOUTH AND THROAT WITH OR WITHOUT PRESENCE OF ULCERS  *URINARY PROBLEMS  *BOWEL PROBLEMS  UNUSUAL RASH Items with * indicate a potential emergency and should be followed up as soon as possible.  Feel free to call the clinic should you have any questions or concerns. The clinic phone number is [phone].  Please show the CHEMO ALERT CARD at check-in to the Emergency Department and triage nurse.   

## 2020-04-04 ENCOUNTER — Other Ambulatory Visit: Payer: 59

## 2020-04-04 ENCOUNTER — Inpatient Hospital Stay (HOSPITAL_BASED_OUTPATIENT_CLINIC_OR_DEPARTMENT_OTHER): Payer: 59 | Admitting: Oncology

## 2020-04-04 DIAGNOSIS — Z5111 Encounter for antineoplastic chemotherapy: Secondary | ICD-10-CM | POA: Diagnosis not present

## 2020-04-04 DIAGNOSIS — C679 Malignant neoplasm of bladder, unspecified: Secondary | ICD-10-CM

## 2020-04-04 NOTE — Progress Notes (Addendum)
Hematology and Oncology Follow Up for Telemedicine Visits  Edward [name] [id number] February 11, 1961 59 y.o. 04/04/2020 11:40 AM Edward [name], MDGreene, [name], MD   I connected with Edward [name] on 04/04/20 at 12:00 PM EST by video enabled telemedicine visit and verified that I am speaking with the correct person using two identifiers.   I discussed the limitations, risks, security and privacy concerns of performing an evaluation and management service by telemedicine and the availability of in-person appointments. I also discussed with the patient that there may be a patient responsible charge related to this service. The patient expressed understanding and agreed to proceed.  Other persons participating in the visit and their role in the encounter:    Patient's location:  Home Provider's location:  office    Principle Diagnosis: 59 year old man with high-grade urothelial carcinoma of the bladder.  He was found to have stage IV disease with extensive bilateral retroperitoneal pelvic and inguinal adenopathy diagnosed in November 2021.  His tumor is PD-L1 positive with score of 100.   Prior Therapy: He is status post cystoscopy, ureteroscopy as well as a left ureteral stent placement on March 21, 2020.  This was performed by Edward [name].  Current therapy: Chemotherapy utilizing gemcitabine and cisplatin started on March 27, 2020.  He is status post cycle 1 of therapy.  Interim History: Edward [name] reports overall feeling reasonably fair and has tolerated the first cycle of chemotherapy without any major complaints.  He did report some constipation and some mild nausea but no vomiting.  He reports his scrotal and thigh edema has improved after the first cycle of chemotherapy.  He has not reported any fevers, chills or sweats.  He denies any abdominal distention or early satiety.   Medications: I have reviewed the patient's current medications.  Current Outpatient Medications   Medication Sig Dispense Refill  . Cholecalciferol (VITAMIN D-3 PO) Take by mouth.    Marland Kitchen ibuprofen (ADVIL) 200 MG tablet Take 800 mg by mouth every 6 (six) hours as needed for headache or moderate pain.    . Multiple Vitamins-Minerals (ZINC PO) Take by mouth daily.    . ondansetron (ZOFRAN) 8 MG tablet Take 1 tablet (8 mg total) by mouth every 8 (eight) hours as needed for nausea or vomiting. 20 tablet 1  . phenazopyridine (PYRIDIUM) 200 MG tablet Take 1 tablet (200 mg total) by mouth 3 (three) times daily as needed for pain. 10 tablet 0  . prochlorperazine (COMPAZINE) 10 MG tablet Take 1 tablet (10 mg total) by mouth every 6 (six) hours as needed for nausea or vomiting. (Patient not taking: No sig reported) 30 tablet 0  . tamsulosin (FLOMAX) 0.4 MG CAPS capsule Take 1 capsule (0.4 mg total) by mouth daily. 30 capsule 11  . traMADol (ULTRAM) 50 MG tablet Take 1-2 tablets (50-100 mg total) by mouth every 6 (six) hours as needed for moderate pain. 15 tablet 0   No current facility-administered medications for this visit.     Allergies: No Known Allergies     Lab Results: Lab Results  Component Value Date   WBC 6.8 04/03/2020   HGB 11.5 (L) 04/03/2020   HCT 35.7 (L) 04/03/2020   MCV 81.9 04/03/2020   PLT 175 04/03/2020     Chemistry      Component Value Date/Time   NA 139 04/03/2020 0754   K 4.1 04/03/2020 0754   CL 105 04/03/2020 0754   CO2 25 04/03/2020 0754   BUN 20 04/03/2020 0754  CREATININE 1.38 (H) 04/03/2020 0754   CREATININE 1.21 12/26/2017 1007      Component Value Date/Time   CALCIUM 9.4 04/03/2020 0754   ALKPHOS 57 04/03/2020 0754   AST 16 04/03/2020 0754   ALT 16 04/03/2020 0754   BILITOT 0.3 04/03/2020 0754        Impression and Plan:   59 year old man with:  1.   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  He was found to have retroperitoneal and pelvic adenopathy.     The natural course of this disease was reviewed at this time and treatment options were discussed.  He has documentation of the origin of his tumor likely arising from the bladder and left ureter and documented metastatic disease on his PET scan.  He has completed the first cycle of gemcitabine and cisplatin without any major complications.  Risks and benefits of continuing this treatment were discussed at this time.  Given his high PD-L1 score is reasonable to switch his treatment to PD-L1 inhibitor after 3-4 cycles of chemotherapy.  We will update his staging scans prior to doing so.   2. IV access:Peripheral veins are currently in use.  We will continue to address the need for Port-A-Cath insertion.  3. Antiemetics: Prescription for Zofran is available to him at this time.  4. Renal function surveillance:  Creatinine clearance continues to be around 60 cc/min and will monitor on platinum based therapy.  5. Goals of care:  His disease appears to be incurable although aggressive measures are warranted at this time.  6. Follow-up: In 2 weeks for the start of cycle 2 of therapy.   80  minutes were dedicated to this visit. The time was spent on reviewing laboratory data, imaging studies, discussing treatment options, discussing differential diagnosis and answering questions regarding future plan. I discussed the assessment and treatment plan with the patient. The patient was provided an opportunity to ask questions and all were answered. The patient agreed with the plan and demonstrated an understanding of the instructions.   The patient was advised to call back or seek an in-person evaluation if the symptoms worsen or if the condition fails to improve as anticipated.  I provided 30  minutes of face-to-face video visit time during this encounter.  The time was dedicated to reviewing his disease status, reviewing laboratory data as well as addressing complications related to  his therapy and future plan of care.   Edward Button, MD 04/04/2020 11:40 AM

## 2020-04-05 ENCOUNTER — Encounter (HOSPITAL_COMMUNITY): Payer: Self-pay

## 2020-04-11 ENCOUNTER — Encounter (HOSPITAL_COMMUNITY): Payer: Self-pay | Admitting: Oncology

## 2020-04-16 ENCOUNTER — Ambulatory Visit: Payer: 59 | Admitting: Oncology

## 2020-04-16 ENCOUNTER — Other Ambulatory Visit: Payer: 59

## 2020-04-17 ENCOUNTER — Ambulatory Visit: Payer: 59

## 2020-04-19 ENCOUNTER — Inpatient Hospital Stay (HOSPITAL_BASED_OUTPATIENT_CLINIC_OR_DEPARTMENT_OTHER): Payer: 59 | Admitting: Oncology

## 2020-04-19 ENCOUNTER — Inpatient Hospital Stay: Payer: 59 | Attending: Oncology

## 2020-04-19 ENCOUNTER — Other Ambulatory Visit: Payer: Self-pay

## 2020-04-19 VITALS — BP 150/73 | HR 85 | Temp 97.9°F | Resp 15 | Ht 72.0 in | Wt 238.1 lb

## 2020-04-19 DIAGNOSIS — Z5111 Encounter for antineoplastic chemotherapy: Secondary | ICD-10-CM | POA: Diagnosis present

## 2020-04-19 DIAGNOSIS — C679 Malignant neoplasm of bladder, unspecified: Secondary | ICD-10-CM

## 2020-04-19 DIAGNOSIS — Z79899 Other long term (current) drug therapy: Secondary | ICD-10-CM | POA: Insufficient documentation

## 2020-04-19 LAB — CMP (CANCER CENTER ONLY)
ALT: 11 U/L (ref 0–44)
AST: 15 U/L (ref 15–41)
Albumin: 3.5 g/dL (ref 3.5–5.0)
Alkaline Phosphatase: 66 U/L (ref 38–126)
Anion gap: 9 (ref 5–15)
BUN: 14 mg/dL (ref 6–20)
CO2: 28 mmol/L (ref 22–32)
Calcium: 9.4 mg/dL (ref 8.9–10.3)
Chloride: 104 mmol/L (ref 98–111)
Creatinine: 1.24 mg/dL (ref 0.61–1.24)
GFR, Estimated: >60 mL/min (ref >60)
Glucose, Bld: 111 mg/dL — ABNORMAL HIGH (ref 70–99)
Potassium: 4.7 mmol/L (ref 3.5–5.1)
Sodium: 141 mmol/L (ref 135–145)
Total Bilirubin: 0.3 mg/dL (ref 0.3–1.2)
Total Protein: 7.8 g/dL (ref 6.5–8.1)

## 2020-04-19 LAB — CBC WITH DIFFERENTIAL (CANCER CENTER ONLY)
Abs Immature Granulocytes: 0.05 10*3/uL (ref 0.00–0.07)
Basophils Absolute: 0.1 10*3/uL (ref 0.0–0.1)
Basophils Relative: 1 %
Eosinophils Absolute: 0.2 10*3/uL (ref 0.0–0.5)
Eosinophils Relative: 3 %
HCT: 33.9 % — ABNORMAL LOW (ref 39.0–52.0)
Hemoglobin: 11 g/dL — ABNORMAL LOW (ref 13.0–17.0)
Immature Granulocytes: 1 %
Lymphocytes Relative: 32 %
Lymphs Abs: 2.3 10*3/uL (ref 0.7–4.0)
MCH: 26.8 pg (ref 26.0–34.0)
MCHC: 32.4 g/dL (ref 30.0–36.0)
MCV: 82.7 fL (ref 80.0–100.0)
Monocytes Absolute: 1.3 10*3/uL — ABNORMAL HIGH (ref 0.1–1.0)
Monocytes Relative: 19 %
Neutro Abs: 3.1 10*3/uL (ref 1.7–7.7)
Neutrophils Relative %: 44 %
Platelet Count: 426 10*3/uL — ABNORMAL HIGH (ref 150–400)
RBC: 4.1 MIL/uL — ABNORMAL LOW (ref 4.22–5.81)
RDW: 13.9 % (ref 11.5–15.5)
WBC Count: 7.1 10*3/uL (ref 4.0–10.5)
nRBC: 0 % (ref 0.0–0.2)

## 2020-04-19 NOTE — Progress Notes (Signed)
Hematology and Oncology Follow Up   Edward [name] [id number] [birth year] 60 y.o. 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  His tumor is PD-L1 positive with score of 100.   Prior Therapy: He is status post cystoscopy, ureteroscopy as well as a left ureteral stent placement on March 21, 2020.  This was performed by Dr. Louis Meckel.  Current therapy: Chemotherapy utilizing gemcitabine and cisplatin started on March 27, 2020.  He is here for evaluation before day 1 of cycle 2 of therapy.  Interim History: [name] presents today for a follow-up evaluation.  Since the last visit, he completed cycle 1 of chemotherapy without any complaints.  He did have skin rash associated with gemcitabine which has improved at this time.  Clinically he reports no residual nausea, vomiting or abdominal pain.  He noticed improvement in his performance status and exercise tolerance.  His left thigh and scrotal edema has improved after chemotherapy.   Medications: Updated on review Current Outpatient Medications  Medication Sig Dispense Refill  . Cholecalciferol (VITAMIN D-3 PO) Take by mouth.    Marland Kitchen ibuprofen (ADVIL) 200 MG tablet Take 800 mg by mouth every 6 (six) hours as needed for headache or moderate pain.    . Multiple Vitamins-Minerals (ZINC PO) Take by mouth daily.    . ondansetron (ZOFRAN) 8 MG tablet Take 1 tablet (8 mg total) by mouth every 8 (eight) hours as needed for nausea or vomiting. 20 tablet 1  . phenazopyridine (PYRIDIUM) 200 MG tablet Take 1 tablet (200 mg total) by mouth 3 (three) times daily as needed for pain. 10 tablet 0  . prochlorperazine (COMPAZINE) 10 MG tablet Take 1 tablet (10 mg total) by mouth every 6 (six) hours as needed for nausea or vomiting. (Patient not taking: No  sig reported) 30 tablet 0  . tamsulosin (FLOMAX) 0.4 MG CAPS capsule Take 1 capsule (0.4 mg total) by mouth daily. 30 capsule 11  . traMADol (ULTRAM) 50 MG tablet Take 1-2 tablets (50-100 mg total) by mouth every 6 (six) hours as needed for moderate pain. 15 tablet 0   No current facility-administered medications for this visit.     Allergies: No Known Allergies   Blood pressure (!) 150/73, pulse 85, temperature 97.9 F (36.6 C), temperature source Tympanic, resp. rate 15, height 6' (1.829 m), weight 238 lb 1.6 oz (108 kg), SpO2 97 %.  ECOG 1  General appearance: Comfortable appearing without any discomfort Head: Normocephalic without any trauma Oropharynx: Mucous membranes are moist and pink without any thrush or ulcers. Eyes: Pupils are equal and round reactive to light. Lymph nodes: No cervical, supraclavicular, inguinal or axillary lymphadenopathy.   Heart:regular rate and rhythm.  S1 and S2 without leg edema. Lung: Clear without any rhonchi or wheezes.  No dullness to percussion. Abdomin: Soft, nontender, nondistended with good bowel sounds.  No hepatosplenomegaly. GU exam: Mild scrotal edema noted.  No inguinal adenopathy palpated. Musculoskeletal: No joint deformity or effusion.  Full range of motion noted. Neurological: No deficits noted on motor, sensory and deep tendon reflex exam. Skin: No petechial rash or dryness.  Appeared moist.     Lab Results: Lab Results  Component Value Date   WBC 6.8 04/03/2020   HGB 11.5 (L) 04/03/2020   HCT 35.7 (L) 04/03/2020   MCV 81.9 04/03/2020  PLT 175 04/03/2020     Chemistry      Component Value Date/Time   NA 139 04/03/2020 0754   K 4.1 04/03/2020 0754   CL 105 04/03/2020 0754   CO2 25 04/03/2020 0754   BUN 20 04/03/2020 0754   CREATININE 1.38 (H) 04/03/2020 0754   CREATININE 1.21 12/26/2017 1007      Component Value Date/Time   CALCIUM 9.4 04/03/2020 0754   ALKPHOS 57 04/03/2020 0754   AST 16 04/03/2020 0754   ALT  16 04/03/2020 0754   BILITOT 0.3 04/03/2020 0754        Impression and Plan:   60 year old man with:  1.   Bladder cancer diagnosed in November 2021.  He was found to have stage IV high-grade urothelial carcinoma with retroperitoneal lymph node involvement.  He is currently receiving systemic chemotherapy utilizing gemcitabine and cisplatin without any major issues.  Risks and benefits of proceeding with cycle 2 of therapy were reviewed.  Complications that include nausea, vomiting, mild suppression, neutropenia and sepsis were reiterated.  Worsening renal insufficiency was also a possibility.  The plan is to complete 3 cycles of therapy and switch to immunotherapy given his high PD-L1 expression after updating imaging studies.   2. IV access: Risks and benefits of Port-A-Cath insertion were discussed again and he opted to defer this option for the time being.  3. Antiemetics: No nausea or vomiting reported at this time.  Antiemetics are available to him.  4. Renal function surveillance:  We will continue to monitor on platinum based therapy.  5. Goals of care:Aggressive measures are warranted although his disease is technically incurable.  His performance status is excellent and aggressive measures are warranted.  6. Follow-up: He will return on January 14 with start of cycle 2 of therapy.  30  minutes were spent on this encounter. The time was spent on reviewing laboratory data, discussing complications related to his cancer and cancer therapy and future plan of care review.    Zola Button, MD 04/19/2020 3:08 PM

## 2020-04-20 ENCOUNTER — Other Ambulatory Visit: Payer: Self-pay

## 2020-04-20 ENCOUNTER — Inpatient Hospital Stay: Payer: 59

## 2020-04-20 VITALS — BP 162/88 | HR 92 | Temp 98.4°F | Resp 20

## 2020-04-20 DIAGNOSIS — Z5111 Encounter for antineoplastic chemotherapy: Secondary | ICD-10-CM | POA: Diagnosis not present

## 2020-04-20 DIAGNOSIS — C679 Malignant neoplasm of bladder, unspecified: Secondary | ICD-10-CM

## 2020-04-20 MED ORDER — PALONOSETRON HCL INJECTION 0.25 MG/5ML
0.2500 mg | Freq: Once | INTRAVENOUS | Status: AC
Start: 2020-04-20 — End: 2020-04-20
  Administered 2020-04-20: 0.25 mg via INTRAVENOUS

## 2020-04-20 MED ORDER — POTASSIUM CHLORIDE IN NACL 20-0.9 MEQ/L-% IV SOLN
Freq: Once | INTRAVENOUS | Status: AC
  Filled 2020-04-20: qty 1000

## 2020-04-20 MED ORDER — PALONOSETRON HCL INJECTION 0.25 MG/5ML
INTRAVENOUS | Status: AC
  Filled 2020-04-20: qty 5

## 2020-04-20 MED ORDER — MAGNESIUM SULFATE 2 GM/50ML IV SOLN
INTRAVENOUS | Status: AC
  Filled 2020-04-20: qty 50

## 2020-04-20 MED ORDER — FOSAPREPITANT DIMEGLUMINE INJECTION 150 MG
150.0000 mg | Freq: Once | INTRAVENOUS | Status: AC
  Administered 2020-04-20: 150 mg via INTRAVENOUS
  Filled 2020-04-20: qty 150

## 2020-04-20 MED ORDER — SODIUM CHLORIDE 0.9 % IV SOLN
Freq: Once | INTRAVENOUS | Status: AC
  Filled 2020-04-20: qty 250

## 2020-04-20 MED ORDER — MAGNESIUM SULFATE 2 GM/50ML IV SOLN
2.0000 g | Freq: Once | INTRAVENOUS | Status: AC
  Administered 2020-04-20: 2 g via INTRAVENOUS

## 2020-04-20 MED ORDER — SODIUM CHLORIDE 0.9 % IV SOLN
1000.0000 mg/m2 | Freq: Once | INTRAVENOUS | Status: AC
  Administered 2020-04-20: 2356 mg via INTRAVENOUS
  Filled 2020-04-20: qty 61.96

## 2020-04-20 MED ORDER — SODIUM CHLORIDE 0.9 % IV SOLN
10.0000 mg | Freq: Once | INTRAVENOUS | Status: AC
  Administered 2020-04-20: 10 mg via INTRAVENOUS
  Filled 2020-04-20: qty 10

## 2020-04-20 MED ORDER — SODIUM CHLORIDE 0.9 % IV SOLN
70.0000 mg/m2 | Freq: Once | INTRAVENOUS | Status: AC
  Administered 2020-04-20: 165 mg via INTRAVENOUS
  Filled 2020-04-20: qty 165

## 2020-04-20 MED ORDER — SODIUM CHLORIDE 0.9 % IV SOLN: Freq: Once | INTRAVENOUS | Status: DC

## 2020-04-20 NOTE — Patient Instructions (Signed)
[facility] Discharge Instructions for Patients Receiving Chemotherapy  Today you received the following chemotherapy agents: gemcitabine and cisplatin.  To help prevent nausea and vomiting after your treatment, we encourage you to take your nausea medication as directed.   If you develop nausea and vomiting that is not controlled by your nausea medication, call the clinic.   BELOW ARE SYMPTOMS THAT SHOULD BE REPORTED IMMEDIATELY:  *FEVER GREATER THAN 100.5 F  *CHILLS WITH OR WITHOUT FEVER  NAUSEA AND VOMITING THAT IS NOT CONTROLLED WITH YOUR NAUSEA MEDICATION  *UNUSUAL SHORTNESS OF BREATH  *UNUSUAL BRUISING OR BLEEDING  TENDERNESS IN MOUTH AND THROAT WITH OR WITHOUT PRESENCE OF ULCERS  *URINARY PROBLEMS  *BOWEL PROBLEMS  UNUSUAL RASH Items with * indicate a potential emergency and should be followed up as soon as possible.  Feel free to call the clinic should you have any questions or concerns. The clinic phone number is [phone].  Please show the CHEMO ALERT CARD at check-in to the Emergency Department and triage nurse.   

## 2020-04-20 NOTE — Progress Notes (Signed)
Per Dr. Alen Blew, ok to proceed with treatment with 100 mL urine output.

## 2020-04-27 ENCOUNTER — Other Ambulatory Visit: Payer: Self-pay

## 2020-04-27 ENCOUNTER — Inpatient Hospital Stay: Payer: 59

## 2020-04-27 VITALS — BP 118/79 | HR 88 | Temp 98.9°F | Resp 18

## 2020-04-27 DIAGNOSIS — C679 Malignant neoplasm of bladder, unspecified: Secondary | ICD-10-CM

## 2020-04-27 DIAGNOSIS — Z5111 Encounter for antineoplastic chemotherapy: Secondary | ICD-10-CM | POA: Diagnosis not present

## 2020-04-27 LAB — CBC WITH DIFFERENTIAL (CANCER CENTER ONLY)
Abs Immature Granulocytes: 0.05 10*3/uL (ref 0.00–0.07)
Basophils Absolute: 0.1 10*3/uL (ref 0.0–0.1)
Basophils Relative: 2 %
Eosinophils Absolute: 0 10*3/uL (ref 0.0–0.5)
Eosinophils Relative: 1 %
HCT: 34.5 % — ABNORMAL LOW (ref 39.0–52.0)
Hemoglobin: 11.4 g/dL — ABNORMAL LOW (ref 13.0–17.0)
Immature Granulocytes: 1 %
Lymphocytes Relative: 48 %
Lymphs Abs: 1.7 10*3/uL (ref 0.7–4.0)
MCH: 27.3 pg (ref 26.0–34.0)
MCHC: 33 g/dL (ref 30.0–36.0)
MCV: 82.5 fL (ref 80.0–100.0)
Monocytes Absolute: 0.4 10*3/uL (ref 0.1–1.0)
Monocytes Relative: 12 %
Neutro Abs: 1.3 10*3/uL — ABNORMAL LOW (ref 1.7–7.7)
Neutrophils Relative %: 36 %
Platelet Count: 198 10*3/uL (ref 150–400)
RBC: 4.18 MIL/uL — ABNORMAL LOW (ref 4.22–5.81)
RDW: 13.7 % (ref 11.5–15.5)
WBC Count: 3.6 10*3/uL — ABNORMAL LOW (ref 4.0–10.5)
nRBC: 0 % (ref 0.0–0.2)

## 2020-04-27 LAB — CMP (CANCER CENTER ONLY)
ALT: 16 U/L (ref 0–44)
AST: 16 U/L (ref 15–41)
Albumin: 3.8 g/dL (ref 3.5–5.0)
Alkaline Phosphatase: 64 U/L (ref 38–126)
Anion gap: 10 (ref 5–15)
BUN: 19 mg/dL (ref 6–20)
CO2: 26 mmol/L (ref 22–32)
Calcium: 9.3 mg/dL (ref 8.9–10.3)
Chloride: 103 mmol/L (ref 98–111)
Creatinine: 1.39 mg/dL — ABNORMAL HIGH (ref 0.61–1.24)
GFR, Estimated: 58 mL/min — ABNORMAL LOW (ref >60)
Glucose, Bld: 115 mg/dL — ABNORMAL HIGH (ref 70–99)
Potassium: 4.1 mmol/L (ref 3.5–5.1)
Sodium: 139 mmol/L (ref 135–145)
Total Bilirubin: 0.3 mg/dL (ref 0.3–1.2)
Total Protein: 7.6 g/dL (ref 6.5–8.1)

## 2020-04-27 MED ORDER — SODIUM CHLORIDE 0.9 % IV SOLN
1000.0000 mg/m2 | Freq: Once | INTRAVENOUS | Status: AC
  Administered 2020-04-27: 2356 mg via INTRAVENOUS
  Filled 2020-04-27: qty 61.96

## 2020-04-27 MED ORDER — PROCHLORPERAZINE MALEATE 10 MG PO TABS
ORAL_TABLET | ORAL | Status: AC
  Filled 2020-04-27: qty 1

## 2020-04-27 MED ORDER — SODIUM CHLORIDE 0.9 % IV SOLN
Freq: Once | INTRAVENOUS | Status: AC
Start: 2020-04-27 — End: 2020-04-27
  Filled 2020-04-27: qty 250

## 2020-04-27 MED ORDER — PROCHLORPERAZINE MALEATE 10 MG PO TABS
10.0000 mg | ORAL_TABLET | Freq: Once | ORAL | Status: AC
  Administered 2020-04-27: 10 mg via ORAL

## 2020-04-27 NOTE — Progress Notes (Signed)
Per Dr. Alen Blew ok to treat with ANC 1.3

## 2020-04-27 NOTE — Patient Instructions (Signed)
[facility] Discharge Instructions for Patients Receiving Chemotherapy  Today you received the following chemotherapy agents: gemcitabine.  To help prevent nausea and vomiting after your treatment, we encourage you to take your nausea medication as directed.   If you develop nausea and vomiting that is not controlled by your nausea medication, call the clinic.   BELOW ARE SYMPTOMS THAT SHOULD BE REPORTED IMMEDIATELY:  *FEVER GREATER THAN 100.5 F  *CHILLS WITH OR WITHOUT FEVER  NAUSEA AND VOMITING THAT IS NOT CONTROLLED WITH YOUR NAUSEA MEDICATION  *UNUSUAL SHORTNESS OF BREATH  *UNUSUAL BRUISING OR BLEEDING  TENDERNESS IN MOUTH AND THROAT WITH OR WITHOUT PRESENCE OF ULCERS  *URINARY PROBLEMS  *BOWEL PROBLEMS  UNUSUAL RASH Items with * indicate a potential emergency and should be followed up as soon as possible.  Feel free to call the clinic should you have any questions or concerns. The clinic phone number is [phone].  Please show the CHEMO ALERT CARD at check-in to the Emergency Department and triage nurse.   

## 2020-05-08 ENCOUNTER — Encounter: Payer: Self-pay | Admitting: Family Medicine

## 2020-05-11 ENCOUNTER — Inpatient Hospital Stay: Payer: 59 | Attending: Oncology

## 2020-05-11 ENCOUNTER — Other Ambulatory Visit: Payer: Self-pay | Admitting: Oncology

## 2020-05-11 ENCOUNTER — Other Ambulatory Visit: Payer: Self-pay

## 2020-05-11 ENCOUNTER — Inpatient Hospital Stay (HOSPITAL_BASED_OUTPATIENT_CLINIC_OR_DEPARTMENT_OTHER): Payer: 59 | Admitting: Oncology

## 2020-05-11 ENCOUNTER — Inpatient Hospital Stay: Payer: 59

## 2020-05-11 VITALS — BP 148/79 | HR 94 | Temp 96.7°F | Resp 18 | Ht 72.0 in | Wt 244.1 lb

## 2020-05-11 DIAGNOSIS — R590 Localized enlarged lymph nodes: Secondary | ICD-10-CM | POA: Diagnosis not present

## 2020-05-11 DIAGNOSIS — Z5111 Encounter for antineoplastic chemotherapy: Secondary | ICD-10-CM | POA: Diagnosis present

## 2020-05-11 DIAGNOSIS — C679 Malignant neoplasm of bladder, unspecified: Secondary | ICD-10-CM

## 2020-05-11 DIAGNOSIS — Z79899 Other long term (current) drug therapy: Secondary | ICD-10-CM | POA: Insufficient documentation

## 2020-05-11 LAB — CBC WITH DIFFERENTIAL (CANCER CENTER ONLY)
Abs Immature Granulocytes: 0.04 10*3/uL (ref 0.00–0.07)
Basophils Absolute: 0 10*3/uL (ref 0.0–0.1)
Basophils Relative: 1 %
Eosinophils Absolute: 0.2 10*3/uL (ref 0.0–0.5)
Eosinophils Relative: 6 %
HCT: 33.2 % — ABNORMAL LOW (ref 39.0–52.0)
Hemoglobin: 10.8 g/dL — ABNORMAL LOW (ref 13.0–17.0)
Immature Granulocytes: 1 %
Lymphocytes Relative: 39 %
Lymphs Abs: 1.6 10*3/uL (ref 0.7–4.0)
MCH: 27.3 pg (ref 26.0–34.0)
MCHC: 32.5 g/dL (ref 30.0–36.0)
MCV: 84.1 fL (ref 80.0–100.0)
Monocytes Absolute: 0.6 10*3/uL (ref 0.1–1.0)
Monocytes Relative: 16 %
Neutro Abs: 1.5 10*3/uL — ABNORMAL LOW (ref 1.7–7.7)
Neutrophils Relative %: 37 %
Platelet Count: 250 10*3/uL (ref 150–400)
RBC: 3.95 MIL/uL — ABNORMAL LOW (ref 4.22–5.81)
RDW: 16 % — ABNORMAL HIGH (ref 11.5–15.5)
WBC Count: 4 10*3/uL (ref 4.0–10.5)
nRBC: 0 % (ref 0.0–0.2)

## 2020-05-11 LAB — CMP (CANCER CENTER ONLY)
ALT: 11 U/L (ref 0–44)
AST: 15 U/L (ref 15–41)
Albumin: 3.9 g/dL (ref 3.5–5.0)
Alkaline Phosphatase: 57 U/L (ref 38–126)
Anion gap: 11 (ref 5–15)
BUN: 16 mg/dL (ref 6–20)
CO2: 22 mmol/L (ref 22–32)
Calcium: 8.9 mg/dL (ref 8.9–10.3)
Chloride: 107 mmol/L (ref 98–111)
Creatinine: 1.26 mg/dL — ABNORMAL HIGH (ref 0.61–1.24)
GFR, Estimated: >60 mL/min (ref >60)
Glucose, Bld: 133 mg/dL — ABNORMAL HIGH (ref 70–99)
Potassium: 3.5 mmol/L (ref 3.5–5.1)
Sodium: 140 mmol/L (ref 135–145)
Total Bilirubin: 0.3 mg/dL (ref 0.3–1.2)
Total Protein: 7.2 g/dL (ref 6.5–8.1)

## 2020-05-11 MED ORDER — PALONOSETRON HCL INJECTION 0.25 MG/5ML
0.2500 mg | Freq: Once | INTRAVENOUS | Status: AC
  Administered 2020-05-11: 0.25 mg via INTRAVENOUS

## 2020-05-11 MED ORDER — SODIUM CHLORIDE 0.9 % IV SOLN
Freq: Once | INTRAVENOUS | Status: AC
Start: 2020-05-11 — End: 2020-05-11
  Filled 2020-05-11: qty 250

## 2020-05-11 MED ORDER — PALONOSETRON HCL INJECTION 0.25 MG/5ML
INTRAVENOUS | Status: AC
  Filled 2020-05-11: qty 5

## 2020-05-11 MED ORDER — SODIUM CHLORIDE 0.9 % IV SOLN
150.0000 mg | Freq: Once | INTRAVENOUS | Status: AC
  Administered 2020-05-11: 150 mg via INTRAVENOUS
  Filled 2020-05-11: qty 150

## 2020-05-11 MED ORDER — SODIUM CHLORIDE 0.9 % IV SOLN
10.0000 mg | Freq: Once | INTRAVENOUS | Status: AC
  Administered 2020-05-11: 10 mg via INTRAVENOUS
  Filled 2020-05-11: qty 10

## 2020-05-11 MED ORDER — POTASSIUM CHLORIDE IN NACL 20-0.9 MEQ/L-% IV SOLN
Freq: Once | INTRAVENOUS | Status: AC
  Filled 2020-05-11: qty 1000

## 2020-05-11 MED ORDER — MAGNESIUM SULFATE 2 GM/50ML IV SOLN
2.0000 g | Freq: Once | INTRAVENOUS | Status: AC
  Administered 2020-05-11: 2 g via INTRAVENOUS

## 2020-05-11 MED ORDER — SODIUM CHLORIDE 0.9 % IV SOLN: Freq: Once | INTRAVENOUS | Status: DC

## 2020-05-11 MED ORDER — MAGNESIUM SULFATE 2 GM/50ML IV SOLN
INTRAVENOUS | Status: AC
  Filled 2020-05-11: qty 50

## 2020-05-11 MED ORDER — SODIUM CHLORIDE 0.9 % IV SOLN
70.0000 mg/m2 | Freq: Once | INTRAVENOUS | Status: AC
  Administered 2020-05-11: 165 mg via INTRAVENOUS
  Filled 2020-05-11: qty 165

## 2020-05-11 MED ORDER — SODIUM CHLORIDE 0.9 % IV SOLN
1000.0000 mg/m2 | Freq: Once | INTRAVENOUS | Status: AC
  Administered 2020-05-11: 2356 mg via INTRAVENOUS
  Filled 2020-05-11: qty 61.96

## 2020-05-11 NOTE — Patient Instructions (Signed)
[facility] Discharge Instructions for Patients Receiving Chemotherapy  Today you received the following chemotherapy agents: gemcitabine and cisplatin.  To help prevent nausea and vomiting after your treatment, we encourage you to take your nausea medication as directed.   If you develop nausea and vomiting that is not controlled by your nausea medication, call the clinic.   BELOW ARE SYMPTOMS THAT SHOULD BE REPORTED IMMEDIATELY:  *FEVER GREATER THAN 100.5 F  *CHILLS WITH OR WITHOUT FEVER  NAUSEA AND VOMITING THAT IS NOT CONTROLLED WITH YOUR NAUSEA MEDICATION  *UNUSUAL SHORTNESS OF BREATH  *UNUSUAL BRUISING OR BLEEDING  TENDERNESS IN MOUTH AND THROAT WITH OR WITHOUT PRESENCE OF ULCERS  *URINARY PROBLEMS  *BOWEL PROBLEMS  UNUSUAL RASH Items with * indicate a potential emergency and should be followed up as soon as possible.  Feel free to call the clinic should you have any questions or concerns. The clinic phone number is [phone].  Please show the CHEMO ALERT CARD at check-in to the Emergency Department and triage nurse.   

## 2020-05-11 NOTE — Progress Notes (Signed)
DISCONTINUE ON PATHWAY REGIMEN - Bladder     A cycle is every 21 days:     Gemcitabine      Cisplatin   **Always confirm dose/schedule in your pharmacy ordering system**  REASON: Toxicities / Adverse Event PRIOR TREATMENT: [id number]: Gemcitabine 1,000 mg/m2 D1, 8 + Cisplatin 70 mg/m2 D1 q21 Days for a Maximum of 6 Cycles TREATMENT RESPONSE: Partial Response (PR)  START OFF PATHWAY REGIMEN - Bladder   [id number]:Pembrolizumab 400 mg IV D1 q42 Days:   A cycle is every 42 days:     Pembrolizumab   **Always confirm dose/schedule in your pharmacy ordering system**  Patient Characteristics: Advanced/Metastatic Disease, Maintenance Therapy Therapeutic Status: Advanced/Metastatic Disease Line of Therapy: Maintenance Therapy  Intent of Therapy: Non-Curative / Palliative Intent, Discussed with Patient

## 2020-05-11 NOTE — Progress Notes (Signed)
Hematology and Oncology Follow Up   Edward [name] [id number] Mar 25, 1961 60 y.o. 05/11/2020 7:47 AM [name], MDGreene, [name], MD      Principle Diagnosis: 60 year old man with bladder cancer diagnosed in November 2021. 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   Prior Therapy: He is status post cystoscopy, ureteroscopy as well as a left ureteral stent placement on March 21, 2020.  This was performed by Dr. Louis Meckel.  Current therapy: Chemotherapy utilizing gemcitabine and cisplatin started on March 27, 2020.  He is here for evaluation before day 1 of cycle 3 of therapy.  Interim History: [name] is here for a repeat follow-up. Since the last visit, he completed 2 cycles of chemotherapy without any major complications.  He reports improvement in his strength and performance status.  He reported decrease in his thigh and scrotal edema.  He is eating well without any GI toxicities.  He denies any diarrhea, worsening neuropathy or dysuria.   Medications: Unchanged on review. Current Outpatient Medications  Medication Sig Dispense Refill  . Cholecalciferol (VITAMIN D-3 PO) Take by mouth.    Marland Kitchen ibuprofen (ADVIL) 200 MG tablet Take 800 mg by mouth every 6 (six) hours as needed for headache or moderate pain.    . Multiple Vitamins-Minerals (ZINC PO) Take by mouth daily.    . ondansetron (ZOFRAN) 8 MG tablet Take 1 tablet (8 mg total) by mouth every 8 (eight) hours as needed for nausea or vomiting. 20 tablet 1  . phenazopyridine (PYRIDIUM) 200 MG tablet Take 1 tablet (200 mg total) by mouth 3 (three) times daily as needed for pain. 10 tablet 0  . prochlorperazine (COMPAZINE) 10 MG tablet Take 1 tablet (10 mg total) by mouth every 6 (six) hours as needed for nausea or vomiting. (Patient not taking: No sig reported) 30 tablet 0  . tamsulosin (FLOMAX) 0.4 MG CAPS  capsule Take 1 capsule (0.4 mg total) by mouth daily. 30 capsule 11  . traMADol (ULTRAM) 50 MG tablet Take 1-2 tablets (50-100 mg total) by mouth every 6 (six) hours as needed for moderate pain. 15 tablet 0   No current facility-administered medications for this visit.     Allergies: No Known Allergies  Physical exam Blood pressure (!) 148/79, pulse 94, temperature (!) 96.7 F (35.9 C), temperature source Tympanic, resp. rate 18, height 6' (1.829 m), weight 244 lb 1.6 oz (110.7 kg), SpO2 100 %.    ECOG 1     General appearance: Alert, awake without any distress. Head: Atraumatic without abnormalities Oropharynx: Without any thrush or ulcers. Eyes: No scleral icterus. Lymph nodes: No lymphadenopathy noted in the cervical, supraclavicular, or axillary nodes Heart:regular rate and rhythm, without any murmurs or gallops.   Lung: Clear to auscultation without any rhonchi, wheezes or dullness to percussion. Abdomin: Soft, nontender without any shifting dullness or ascites. Musculoskeletal: No clubbing or cyanosis. Neurological: No motor or sensory deficits. Skin: No rashes or lesions. Psychiatric: Mood and affect appeared normal.      Lab Results: Lab Results  Component Value Date   WBC 3.6 (L) 04/27/2020   HGB 11.4 (L) 04/27/2020   HCT 34.5 (L) 04/27/2020   MCV 82.5 04/27/2020   PLT 198 04/27/2020     Chemistry      Component Value Date/Time   NA 139 04/27/2020 0934   K 4.1 04/27/2020 0934   CL 103 04/27/2020  0934   CO2 26 04/27/2020 0934   BUN 19 04/27/2020 0934   CREATININE 1.39 (H) 04/27/2020 0934   CREATININE 1.21 12/26/2017 1007      Component Value Date/Time   CALCIUM 9.3 04/27/2020 0934   ALKPHOS 64 04/27/2020 0934   AST 16 04/27/2020 0934   ALT 16 04/27/2020 0934   BILITOT 0.3 04/27/2020 0934        Impression and Plan:   60 year old man with:  1.  Stage IV high-grade urothelial carcinoma of the bladder with retroperitoneal lymph node  involvement noted in November 2021.  The natural course of his disease was updated at this time and treatment options were reviewed. He has tolerated chemotherapy very well with reasonable clinical response and improvement in his symptoms including pelvic pain and fluid retention. He has experienced improvement in his thigh pain and scrotal edema. Risks and benefits of proceeding with cycle 3 of therapy were discussed. Complications including neutropenia, renal insufficiency and infusion related complications.  Upon completing cycle 3 we will obtain imaging studies and potentially switch to maintenance immunotherapy given his high PD-L1 score.  He is agreeable to proceed with this plan.   2. IV access: Peripheral veins are currently in use without any issues.  3. Antiemetics:  no issues reported since the last visit. He has not used many antiemetics.  4. Renal function surveillance:  Creatinine clearance remains close to normal range and will monitor on platinum therapy.  5. Goals of care:His disease might not be curable but aggressive measures are warranted at this time.  6. Follow-up: Next week for day 8 of the current cycle and 3 weeks for the start of the next cycle.  30  minutes were dedicated to this visit. The time was spent on reviewing disease status, discussing treatment options and outlining future plan of care.   Zola Button, MD 05/11/2020 7:47 AM

## 2020-05-18 ENCOUNTER — Inpatient Hospital Stay: Payer: 59

## 2020-05-18 ENCOUNTER — Other Ambulatory Visit: Payer: Self-pay

## 2020-05-18 VITALS — BP 139/68 | HR 81 | Temp 98.2°F | Resp 17

## 2020-05-18 DIAGNOSIS — C679 Malignant neoplasm of bladder, unspecified: Secondary | ICD-10-CM

## 2020-05-18 DIAGNOSIS — Z5111 Encounter for antineoplastic chemotherapy: Secondary | ICD-10-CM | POA: Diagnosis not present

## 2020-05-18 LAB — CBC WITH DIFFERENTIAL (CANCER CENTER ONLY)
Abs Immature Granulocytes: 0.09 10*3/uL — ABNORMAL HIGH (ref 0.00–0.07)
Basophils Absolute: 0.1 10*3/uL (ref 0.0–0.1)
Basophils Relative: 2 %
Eosinophils Absolute: 0.1 10*3/uL (ref 0.0–0.5)
Eosinophils Relative: 3 %
HCT: 30 % — ABNORMAL LOW (ref 39.0–52.0)
Hemoglobin: 9.8 g/dL — ABNORMAL LOW (ref 13.0–17.0)
Immature Granulocytes: 3 %
Lymphocytes Relative: 43 %
Lymphs Abs: 1.2 10*3/uL (ref 0.7–4.0)
MCH: 27.5 pg (ref 26.0–34.0)
MCHC: 32.7 g/dL (ref 30.0–36.0)
MCV: 84 fL (ref 80.0–100.0)
Monocytes Absolute: 0.2 10*3/uL (ref 0.1–1.0)
Monocytes Relative: 9 %
Neutro Abs: 1.1 10*3/uL — ABNORMAL LOW (ref 1.7–7.7)
Neutrophils Relative %: 40 %
Platelet Count: 279 10*3/uL (ref 150–400)
RBC: 3.57 MIL/uL — ABNORMAL LOW (ref 4.22–5.81)
RDW: 16.3 % — ABNORMAL HIGH (ref 11.5–15.5)
WBC Count: 2.7 10*3/uL — ABNORMAL LOW (ref 4.0–10.5)
nRBC: 0 % (ref 0.0–0.2)

## 2020-05-18 LAB — CMP (CANCER CENTER ONLY)
ALT: 13 U/L (ref 0–44)
AST: 14 U/L — ABNORMAL LOW (ref 15–41)
Albumin: 3.8 g/dL (ref 3.5–5.0)
Alkaline Phosphatase: 56 U/L (ref 38–126)
Anion gap: 9 (ref 5–15)
BUN: 20 mg/dL (ref 6–20)
CO2: 23 mmol/L (ref 22–32)
Calcium: 8.8 mg/dL — ABNORMAL LOW (ref 8.9–10.3)
Chloride: 107 mmol/L (ref 98–111)
Creatinine: 1.24 mg/dL (ref 0.61–1.24)
GFR, Estimated: >60 mL/min (ref >60)
Glucose, Bld: 140 mg/dL — ABNORMAL HIGH (ref 70–99)
Potassium: 3.9 mmol/L (ref 3.5–5.1)
Sodium: 139 mmol/L (ref 135–145)
Total Bilirubin: 0.2 mg/dL — ABNORMAL LOW (ref 0.3–1.2)
Total Protein: 6.9 g/dL (ref 6.5–8.1)

## 2020-05-18 LAB — TSH: TSH: 1.615 u[IU]/mL (ref 0.320–4.118)

## 2020-05-18 LAB — MAGNESIUM: Magnesium: 1.8 mg/dL (ref 1.7–2.4)

## 2020-05-18 MED ORDER — SODIUM CHLORIDE 0.9 % IV SOLN
Freq: Once | INTRAVENOUS | Status: AC
  Filled 2020-05-18: qty 250

## 2020-05-18 MED ORDER — PROCHLORPERAZINE MALEATE 10 MG PO TABS
ORAL_TABLET | ORAL | Status: AC
  Filled 2020-05-18: qty 1

## 2020-05-18 MED ORDER — SODIUM CHLORIDE 0.9 % IV SOLN
1000.0000 mg/m2 | Freq: Once | INTRAVENOUS | Status: AC
  Administered 2020-05-18: 2356 mg via INTRAVENOUS
  Filled 2020-05-18: qty 61.96
  Filled 2020-05-18: qty 62

## 2020-05-18 MED ORDER — PROCHLORPERAZINE MALEATE 10 MG PO TABS
10.0000 mg | ORAL_TABLET | Freq: Once | ORAL | Status: AC
  Administered 2020-05-18: 10 mg via ORAL

## 2020-05-18 NOTE — Patient Instructions (Signed)
[facility] Discharge Instructions for Patients Receiving Chemotherapy  Today you received the following chemotherapy agents Gemcitabine.  To help prevent nausea and vomiting after your treatment, we encourage you to take your nausea medication as directed.  If you develop nausea and vomiting that is not controlled by your nausea medication, call the clinic.   BELOW ARE SYMPTOMS THAT SHOULD BE REPORTED IMMEDIATELY:  *FEVER GREATER THAN 100.5 F  *CHILLS WITH OR WITHOUT FEVER  NAUSEA AND VOMITING THAT IS NOT CONTROLLED WITH YOUR NAUSEA MEDICATION  *UNUSUAL SHORTNESS OF BREATH  *UNUSUAL BRUISING OR BLEEDING  TENDERNESS IN MOUTH AND THROAT WITH OR WITHOUT PRESENCE OF ULCERS  *URINARY PROBLEMS  *BOWEL PROBLEMS  UNUSUAL RASH Items with * indicate a potential emergency and should be followed up as soon as possible.  Feel free to call the clinic should you have any questions or concerns. The clinic phone number is [phone].  Please show the CHEMO ALERT CARD at check-in to the Emergency Department and triage nurse.   

## 2020-05-18 NOTE — Progress Notes (Signed)
Okay to treat with ANC 1.1 per Dr. Alen Blew

## 2020-05-18 NOTE — Progress Notes (Signed)
Okay to treat with ANC 1.1 per Dr. Alen Blew.

## 2020-06-01 ENCOUNTER — Ambulatory Visit (HOSPITAL_COMMUNITY): Payer: 59

## 2020-06-04 ENCOUNTER — Other Ambulatory Visit: Payer: Self-pay | Admitting: Oncology

## 2020-06-08 ENCOUNTER — Inpatient Hospital Stay: Payer: 59

## 2020-06-08 ENCOUNTER — Inpatient Hospital Stay: Payer: 59 | Attending: Oncology

## 2020-06-08 ENCOUNTER — Other Ambulatory Visit: Payer: Self-pay

## 2020-06-08 ENCOUNTER — Inpatient Hospital Stay (HOSPITAL_BASED_OUTPATIENT_CLINIC_OR_DEPARTMENT_OTHER): Payer: 59 | Admitting: Oncology

## 2020-06-08 VITALS — BP 147/79

## 2020-06-08 VITALS — BP 183/78 | HR 83 | Temp 97.1°F | Resp 18 | Ht 72.0 in | Wt 246.1 lb

## 2020-06-08 DIAGNOSIS — C670 Malignant neoplasm of trigone of bladder: Secondary | ICD-10-CM

## 2020-06-08 DIAGNOSIS — Z5112 Encounter for antineoplastic immunotherapy: Secondary | ICD-10-CM | POA: Insufficient documentation

## 2020-06-08 DIAGNOSIS — C679 Malignant neoplasm of bladder, unspecified: Secondary | ICD-10-CM | POA: Insufficient documentation

## 2020-06-08 DIAGNOSIS — Z79899 Other long term (current) drug therapy: Secondary | ICD-10-CM | POA: Diagnosis not present

## 2020-06-08 DIAGNOSIS — C772 Secondary and unspecified malignant neoplasm of intra-abdominal lymph nodes: Secondary | ICD-10-CM | POA: Diagnosis present

## 2020-06-08 LAB — CBC WITH DIFFERENTIAL (CANCER CENTER ONLY)
Abs Immature Granulocytes: 0.03 10*3/uL (ref 0.00–0.07)
Basophils Absolute: 0.1 10*3/uL (ref 0.0–0.1)
Basophils Relative: 1 %
Eosinophils Absolute: 0.3 10*3/uL (ref 0.0–0.5)
Eosinophils Relative: 5 %
HCT: 33.8 % — ABNORMAL LOW (ref 39.0–52.0)
Hemoglobin: 11.2 g/dL — ABNORMAL LOW (ref 13.0–17.0)
Immature Granulocytes: 1 %
Lymphocytes Relative: 30 %
Lymphs Abs: 1.5 10*3/uL (ref 0.7–4.0)
MCH: 28.6 pg (ref 26.0–34.0)
MCHC: 33.1 g/dL (ref 30.0–36.0)
MCV: 86.2 fL (ref 80.0–100.0)
Monocytes Absolute: 1.1 10*3/uL — ABNORMAL HIGH (ref 0.1–1.0)
Monocytes Relative: 22 %
Neutro Abs: 2.1 10*3/uL (ref 1.7–7.7)
Neutrophils Relative %: 41 %
Platelet Count: 263 10*3/uL (ref 150–400)
RBC: 3.92 MIL/uL — ABNORMAL LOW (ref 4.22–5.81)
RDW: 18.4 % — ABNORMAL HIGH (ref 11.5–15.5)
WBC Count: 5.1 10*3/uL (ref 4.0–10.5)
nRBC: 0 % (ref 0.0–0.2)

## 2020-06-08 LAB — CMP (CANCER CENTER ONLY)
ALT: 10 U/L (ref 10–47)
AST: 15 U/L (ref 11–38)
Albumin: 4 g/dL (ref 3.5–5.0)
Alkaline Phosphatase: 57 U/L (ref 38–126)
Anion gap: 11 (ref 5–15)
BUN: 17 mg/dL (ref 6–20)
CO2: 23 mmol/L (ref 22–32)
Calcium: 8.9 mg/dL (ref 8.9–10.3)
Chloride: 105 mmol/L (ref 98–111)
Creatinine: 1.32 mg/dL — ABNORMAL HIGH (ref 0.60–1.20)
GFR, Estimated: >60 mL/min (ref >60)
Glucose, Bld: 171 mg/dL — ABNORMAL HIGH (ref 70–99)
Potassium: 4.2 mmol/L (ref 3.5–5.1)
Sodium: 139 mmol/L (ref 135–145)
Total Bilirubin: 0.3 mg/dL (ref 0.2–1.6)
Total Protein: 7.3 g/dL (ref 6.5–8.1)

## 2020-06-08 LAB — TSH: TSH: 2.092 u[IU]/mL (ref 0.320–4.118)

## 2020-06-08 MED ORDER — SODIUM CHLORIDE 0.9 % IV SOLN
200.0000 mg | Freq: Once | INTRAVENOUS | Status: AC
  Administered 2020-06-08: 200 mg via INTRAVENOUS
  Filled 2020-06-08: qty 8

## 2020-06-08 MED ORDER — SODIUM CHLORIDE 0.9 % IV SOLN
Freq: Once | INTRAVENOUS | Status: AC
  Filled 2020-06-08: qty 250

## 2020-06-08 NOTE — Patient Instructions (Signed)
[facility] Discharge Instructions for Patients Receiving Chemotherapy  Today you received the following chemotherapy agents: Keytruda  To help prevent nausea and vomiting after your treatment, we encourage you to take your nausea medication as directed.  If you develop nausea and vomiting that is not controlled by your nausea medication, call the clinic.   BELOW ARE SYMPTOMS THAT SHOULD BE REPORTED IMMEDIATELY:  *FEVER GREATER THAN 100.5 F  *CHILLS WITH OR WITHOUT FEVER  NAUSEA AND VOMITING THAT IS NOT CONTROLLED WITH YOUR NAUSEA MEDICATION  *UNUSUAL SHORTNESS OF BREATH  *UNUSUAL BRUISING OR BLEEDING  TENDERNESS IN MOUTH AND THROAT WITH OR WITHOUT PRESENCE OF ULCERS  *URINARY PROBLEMS  *BOWEL PROBLEMS  UNUSUAL RASH Items with * indicate a potential emergency and should be followed up as soon as possible.  Feel free to call the clinic should you have any questions or concerns. The clinic phone number is [phone].  Please show the CHEMO ALERT CARD at check-in to the Emergency Department and triage nurse.  Pembrolizumab injection What is this medicine? PEMBROLIZUMAB (pem broe liz ue mab) is a monoclonal antibody. It is used to treat certain types of cancer. This medicine may be used for other purposes; ask your health care provider or pharmacist if you have questions. COMMON BRAND NAME(S): Keytruda What should I tell my health care provider before I take this medicine? They need to know if you have any of these conditions:  autoimmune diseases like Crohn's disease, ulcerative colitis, or lupus  have had or planning to have an allogeneic stem cell transplant (uses someone else's stem cells)  history of organ transplant  history of chest radiation  nervous system problems like myasthenia gravis or Guillain-Barre syndrome  an unusual or allergic reaction to pembrolizumab, other medicines, foods, dyes, or preservatives  pregnant or trying to get  pregnant  breast-feeding How should I use this medicine? This medicine is for infusion into a vein. It is given by a health care professional in a hospital or clinic setting. A special MedGuide will be given to you before each treatment. Be sure to read this information carefully each time. Talk to your pediatrician regarding the use of this medicine in children. While this drug may be prescribed for children as young as 6 months for selected conditions, precautions do apply. Overdosage: If you think you have taken too much of this medicine contact a poison control center or emergency room at once. NOTE: This medicine is only for you. Do not share this medicine with others. What if I miss a dose? It is important not to miss your dose. Call your doctor or health care professional if you are unable to keep an appointment. What may interact with this medicine? Interactions have not been studied. This list may not describe all possible interactions. Give your health care provider a list of all the medicines, herbs, non-prescription drugs, or dietary supplements you use. Also tell them if you smoke, drink alcohol, or use illegal drugs. Some items may interact with your medicine. What should I watch for while using this medicine? Your condition will be monitored carefully while you are receiving this medicine. You may need blood work done while you are taking this medicine. Do not become pregnant while taking this medicine or for 4 months after stopping it. Women should inform their doctor if they wish to become pregnant or think they might be pregnant. There is a potential for serious side effects to an unborn child. Talk to your   health care professional or pharmacist for more information. Do not breast-feed an infant while taking this medicine or for 4 months after the last dose. What side effects may I notice from receiving this medicine? Side effects that you should report to your doctor or health  care professional as soon as possible:  allergic reactions like skin rash, itching or hives, swelling of the face, lips, or tongue  bloody or black, tarry  breathing problems  changes in vision  chest pain  chills  confusion  constipation  cough  diarrhea  dizziness or feeling faint or lightheaded  fast or irregular heartbeat  fever  flushing  joint pain  low blood counts - this medicine may decrease the number of white blood cells, red blood cells and platelets. You may be at increased risk for infections and bleeding.  muscle pain  muscle weakness  pain, tingling, numbness in the hands or feet  persistent headache  redness, blistering, peeling or loosening of the skin, including inside the mouth  signs and symptoms of high blood sugar such as dizziness; dry mouth; dry skin; fruity breath; nausea; stomach pain; increased hunger or thirst; increased urination  signs and symptoms of kidney injury like trouble passing urine or change in the amount of urine  signs and symptoms of liver injury like dark urine, light-colored stools, loss of appetite, nausea, right upper belly pain, yellowing of the eyes or skin  sweating  swollen lymph nodes  weight loss Side effects that usually do not require medical attention (report to your doctor or health care professional if they continue or are bothersome):  decreased appetite  hair loss  tiredness This list may not describe all possible side effects. Call your doctor for medical advice about side effects. You may report side effects to FDA at 1-800-FDA-1088. Where should I keep my medicine? This drug is given in a hospital or clinic and will not be stored at home. NOTE: This sheet is a summary. It may not cover all possible information. If you have questions about this medicine, talk to your doctor, pharmacist, or health care provider.  2021 Elsevier/Gold Standard (2019-02-23 21:44:53)  

## 2020-06-08 NOTE — Progress Notes (Signed)
Per Dr. Alen Blew - okay to proceed with treatment today usinig CMET results from 2/11.

## 2020-06-08 NOTE — Progress Notes (Signed)
Hematology and Oncology Follow Up   Edward [name] [id number] Oct 17, 1960 60 y.o. 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  This was performed by Dr. Louis Meckel.  Chemotherapy utilizing gemcitabine and cisplatin started on March 27, 2020.  He completed 3 cycles of therapy.  Current therapy: Pembrolizumab 200 mg every 3 weeks.  Cycle 1 started on 06/08/2020.  He will receive 400 mg every 6 weeks starting on June 29, 2020.  Interim History: Edward [name] returns today for repeat follow-up.  Since the last visit, he reports feeling well without any complaints.  He denies any residual complications to chemotherapy.  He denies any nausea, vomiting, neuropathy or hearing loss.  He denies any worsening edema or pelvic pain.  Continues to exercise regularly without any decline ability to do so.   Medications: reviewed changes Current Outpatient Medications  Medication Sig Dispense Refill  . Cholecalciferol (VITAMIN D-3 PO) Take by mouth.    Marland Kitchen ibuprofen (ADVIL) 200 MG tablet Take 800 mg by mouth every 6 (six) hours as needed for headache or moderate pain.    . Multiple Vitamins-Minerals (ZINC PO) Take by mouth daily.    . ondansetron (ZOFRAN) 8 MG tablet Take 1 tablet (8 mg total) by mouth every 8 (eight) hours as needed for nausea or vomiting. 20 tablet 1  . phenazopyridine (PYRIDIUM) 200 MG tablet Take 1 tablet (200 mg total) by mouth 3 (three) times daily as needed for pain. 10 tablet 0  . prochlorperazine (COMPAZINE) 10 MG tablet Take 1 tablet (10 mg total) by mouth every 6 (six) hours as needed for nausea or vomiting. (Patient not taking: No sig  reported) 30 tablet 0  . tamsulosin (FLOMAX) 0.4 MG CAPS capsule Take 1 capsule (0.4 mg total) by mouth daily. 30 capsule 11  . traMADol (ULTRAM) 50 MG tablet Take 1-2 tablets (50-100 mg total) by mouth every 6 (six) hours as needed for moderate pain. 15 tablet 0   No current facility-administered medications for this visit.     Allergies: No Known Allergies  Physical exam  Blood pressure (!) 183/78, pulse 83, temperature (!) 97.1 F (36.2 C), temperature source Tympanic, resp. rate 18, height 6' (1.829 m), weight 246 lb 1.6 oz (111.6 kg), SpO2 100 %.    ECOG 1    General appearance: Comfortable appearing without any discomfort Head: Normocephalic without any trauma Oropharynx: Mucous membranes are moist and pink without any thrush or ulcers. Eyes: Pupils are equal and round reactive to light. Lymph nodes: No cervical, supraclavicular, inguinal or axillary lymphadenopathy.   Heart:regular rate and rhythm.  S1 and S2 without leg edema. Lung: Clear without any rhonchi or wheezes.  No dullness to percussion. Abdomin: Soft, nontender, nondistended with good bowel sounds.  No hepatosplenomegaly. Musculoskeletal: No joint deformity or effusion.  Full range of motion noted. Neurological: No deficits noted on motor, sensory and deep tendon reflex exam. Skin: No petechial rash or dryness.  Appeared moist.       Lab Results: Lab Results  Component Value Date   WBC 2.7 (L) 05/18/2020   HGB 9.8 (L) 05/18/2020   HCT 30.0 (L) 05/18/2020   MCV 84.0  05/18/2020   PLT 279 05/18/2020     Chemistry      Component Value Date/Time   NA 139 05/18/2020 0802   K 3.9 05/18/2020 0802   CL 107 05/18/2020 0802   CO2 23 05/18/2020 0802   BUN 20 05/18/2020 0802   CREATININE 1.24 05/18/2020 0802   CREATININE 1.21 12/26/2017 1007      Component Value Date/Time   CALCIUM 8.8 (L) 05/18/2020 0802   ALKPHOS 56 05/18/2020 0802   AST 14 (L) 05/18/2020 0802   ALT 13 05/18/2020 0802   BILITOT 0.2  (L) 05/18/2020 0802        Impression and Plan:   60 year old man with:  1.   Bladder cancer diagnosed in November 2021.  He was found to have stage IV high-grade urothelial carcinoma with retroperitoneal lymph node involvement.   He completed 3 cycles of chemotherapy utilizing gemcitabine plan and here to start switch maintenance with Pembrolizumab.  Risks and benefits of this treatment were discussed today.  Complication include immune mediated complications, skin rash itching dermatitis.  He is scheduled to have a updating and next week.  He is agreeable to proceed and we will law switch his Pembrolizumab to every 6 weeks pending his tolerance.   2. IV access: No issues related to his peripheral veins at this time.  3. Antiemetics: No nausea or vomiting reported.  Antiemetics available to him.   4. Renal function surveillance:  Kidney function remained stable without any decline after platinum therapy.  5. Goals of care:Aggressive measures are warranted given his excellent performance status.  6.  Immune mediated complications: I continue to educate him about potential issues.  Including hypothyroidism, pruritus, fatigue among others.  Other complications including pneumonitis, colitis and hepatitis were also reiterated.  7. Follow-up: In 3 weeks for the next cycle of therapy.  30  minutes were spent on the encounter.  No dedicated to reviewing his disease status and treatment options and future plan of care review.   Zola Button, MD 06/08/2020 7:43 AM

## 2020-06-13 ENCOUNTER — Ambulatory Visit (HOSPITAL_COMMUNITY): Admission: RE | Admit: 2020-06-13 | Payer: 59 | Source: Ambulatory Visit

## 2020-06-13 ENCOUNTER — Encounter (HOSPITAL_COMMUNITY): Payer: Self-pay

## 2020-06-15 ENCOUNTER — Other Ambulatory Visit: Payer: Self-pay | Admitting: Oncology

## 2020-06-15 DIAGNOSIS — C670 Malignant neoplasm of trigone of bladder: Secondary | ICD-10-CM

## 2020-06-27 ENCOUNTER — Encounter (HOSPITAL_COMMUNITY): Payer: Self-pay

## 2020-06-27 ENCOUNTER — Other Ambulatory Visit: Payer: Self-pay

## 2020-06-27 ENCOUNTER — Ambulatory Visit (HOSPITAL_COMMUNITY)
Admission: RE | Admit: 2020-06-27 | Discharge: 2020-06-27 | Disposition: A | Discharge status: Home / Self Care | Payer: 59 | Source: Ambulatory Visit | Attending: Oncology | Admitting: Oncology

## 2020-06-27 DIAGNOSIS — C670 Malignant neoplasm of trigone of bladder: Secondary | ICD-10-CM | POA: Diagnosis present

## 2020-06-27 MED ORDER — SODIUM CHLORIDE 0.9 % IV SOLN
INTRAVENOUS | Status: AC
  Filled 2020-06-27: qty 250

## 2020-06-27 MED ORDER — IOHEXOL 300 MG/ML  SOLN
100.0000 mL | Freq: Once | INTRAMUSCULAR | Status: AC | PRN
  Administered 2020-06-27: 100 mL via INTRAVENOUS

## 2020-06-29 ENCOUNTER — Inpatient Hospital Stay: Payer: 59

## 2020-06-29 ENCOUNTER — Inpatient Hospital Stay (HOSPITAL_BASED_OUTPATIENT_CLINIC_OR_DEPARTMENT_OTHER): Payer: 59 | Admitting: Oncology

## 2020-06-29 ENCOUNTER — Other Ambulatory Visit: Payer: Self-pay

## 2020-06-29 VITALS — BP 149/86 | HR 74 | Temp 98.5°F | Resp 18 | Wt 246.4 lb

## 2020-06-29 DIAGNOSIS — Z5112 Encounter for antineoplastic immunotherapy: Secondary | ICD-10-CM | POA: Diagnosis not present

## 2020-06-29 DIAGNOSIS — C670 Malignant neoplasm of trigone of bladder: Secondary | ICD-10-CM

## 2020-06-29 DIAGNOSIS — C679 Malignant neoplasm of bladder, unspecified: Secondary | ICD-10-CM

## 2020-06-29 LAB — CBC WITH DIFFERENTIAL (CANCER CENTER ONLY)
Abs Immature Granulocytes: 0.01 10*3/uL (ref 0.00–0.07)
Basophils Absolute: 0.1 10*3/uL (ref 0.0–0.1)
Basophils Relative: 1 %
Eosinophils Absolute: 0.4 10*3/uL (ref 0.0–0.5)
Eosinophils Relative: 6 %
HCT: 36 % — ABNORMAL LOW (ref 39.0–52.0)
Hemoglobin: 12 g/dL — ABNORMAL LOW (ref 13.0–17.0)
Immature Granulocytes: 0 %
Lymphocytes Relative: 32 %
Lymphs Abs: 1.8 10*3/uL (ref 0.7–4.0)
MCH: 28.8 pg (ref 26.0–34.0)
MCHC: 33.3 g/dL (ref 30.0–36.0)
MCV: 86.5 fL (ref 80.0–100.0)
Monocytes Absolute: 0.6 10*3/uL (ref 0.1–1.0)
Monocytes Relative: 11 %
Neutro Abs: 2.8 10*3/uL (ref 1.7–7.7)
Neutrophils Relative %: 50 %
Platelet Count: 150 10*3/uL (ref 150–400)
RBC: 4.16 MIL/uL — ABNORMAL LOW (ref 4.22–5.81)
RDW: 15.6 % — ABNORMAL HIGH (ref 11.5–15.5)
WBC Count: 5.7 10*3/uL (ref 4.0–10.5)
nRBC: 0 % (ref 0.0–0.2)

## 2020-06-29 LAB — TSH: TSH: 2.78 u[IU]/mL (ref 0.320–4.118)

## 2020-06-29 LAB — CMP (CANCER CENTER ONLY)
ALT: 12 U/L (ref 0–44)
AST: 16 U/L (ref 15–41)
Albumin: 4.2 g/dL (ref 3.5–5.0)
Alkaline Phosphatase: 55 U/L (ref 38–126)
Anion gap: 12 (ref 5–15)
BUN: 18 mg/dL (ref 6–20)
CO2: 23 mmol/L (ref 22–32)
Calcium: 9.2 mg/dL (ref 8.9–10.3)
Chloride: 105 mmol/L (ref 98–111)
Creatinine: 1.28 mg/dL — ABNORMAL HIGH (ref 0.61–1.24)
GFR, Estimated: >60 mL/min (ref >60)
Glucose, Bld: 136 mg/dL — ABNORMAL HIGH (ref 70–99)
Potassium: 3.9 mmol/L (ref 3.5–5.1)
Sodium: 140 mmol/L (ref 135–145)
Total Bilirubin: 0.3 mg/dL (ref 0.3–1.2)
Total Protein: 7.7 g/dL (ref 6.5–8.1)

## 2020-06-29 MED ORDER — SODIUM CHLORIDE 0.9 % IV SOLN
Freq: Once | INTRAVENOUS | Status: AC
  Filled 2020-06-29: qty 250

## 2020-06-29 MED ORDER — SODIUM CHLORIDE 0.9 % IV SOLN
400.0000 mg | Freq: Once | INTRAVENOUS | Status: AC
  Administered 2020-06-29: 400 mg via INTRAVENOUS
  Filled 2020-06-29: qty 16

## 2020-06-29 NOTE — Progress Notes (Signed)
Hematology and Oncology Follow Up   Edward [name] [id number] December 01, 1960 60 y.o. 06/29/2020 8:02 AM [name], MDGreene, [name], MD      Principle Diagnosis: 60 year old man with bladder cancer diagnosed in November 2021.  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    Prior Therapy:   He is status post cystoscopy, ureteroscopy as well as a left ureteral stent placement on March 21, 2020.  This was performed by Dr. Louis Meckel.  Chemotherapy utilizing gemcitabine and cisplatin started on March 27, 2020.  He completed 3 cycles of therapy.  Current therapy: Pembrolizumab 200 mg every 3 weeks.  Cycle 1 started on 06/08/2020.  He will receive 400 mg every 6 weeks starting with his treatment today.  Interim History: [name] is here for repeat evaluation.  Since the last visit, he reports a feeling well without any major complaints.  He denies any nausea, vomiting or abdominal pain.  He denies any hospitalization or illnesses.  He is lower extremity edema and pelvic edema has nearly resolved.  His performance status quality of life remained excellent.  He denies any pruritus, respiratory status changes or diarrhea.   Medications: Unchanged on review. Current Outpatient Medications  Medication Sig Dispense Refill  . Cholecalciferol (VITAMIN D-3 PO) Take by mouth.    Marland Kitchen ibuprofen (ADVIL) 200 MG tablet Take 800 mg by mouth every 6 (six) hours as needed for headache or moderate pain.    . Multiple Vitamins-Minerals (ZINC PO) Take by mouth daily.    . ondansetron (ZOFRAN) 8 MG tablet Take 1 tablet (8 mg total) by mouth every 8 (eight) hours as needed for nausea or vomiting. 20 tablet 1  . phenazopyridine (PYRIDIUM) 200 MG tablet Take 1 tablet (200 mg total) by mouth 3 (three) times daily as needed for pain. 10 tablet 0  . prochlorperazine (COMPAZINE) 10 MG tablet Take  1 tablet (10 mg total) by mouth every 6 (six) hours as needed for nausea or vomiting. (Patient not taking: No sig reported) 30 tablet 0  . tamsulosin (FLOMAX) 0.4 MG CAPS capsule Take 1 capsule (0.4 mg total) by mouth daily. 30 capsule 11  . traMADol (ULTRAM) 50 MG tablet Take 1-2 tablets (50-100 mg total) by mouth every 6 (six) hours as needed for moderate pain. 15 tablet 0   No current facility-administered medications for this visit.     Allergies: No Known Allergies  Physical exam   Blood pressure (!) 149/86, pulse 74, temperature 98.5 F (36.9 C), temperature source Oral, resp. rate 18, weight 246 lb 6.4 oz (111.8 kg), SpO2 99 %.    ECOG 1      General appearance: Alert, awake without any distress. Head: Atraumatic without abnormalities Oropharynx: Without any thrush or ulcers. Eyes: No scleral icterus. Lymph nodes: No lymphadenopathy noted in the cervical, supraclavicular, or axillary nodes Heart:regular rate and rhythm, without any murmurs or gallops.   Lung: Clear to auscultation without any rhonchi, wheezes or dullness to percussion. Abdomin: Soft, nontender without any shifting dullness or ascites. Musculoskeletal: No clubbing or cyanosis. Neurological: No motor or sensory deficits. Skin: No rashes or lesions.        Lab Results: Lab Results  Component Value Date   WBC 5.1 06/08/2020   HGB 11.2 (L) 06/08/2020   HCT 33.8 (L) 06/08/2020   MCV 86.2 06/08/2020   PLT 263 06/08/2020     Chemistry  Component Value Date/Time   NA 139 06/08/2020 0807   K 4.2 06/08/2020 0807   CL 105 06/08/2020 0807   CO2 23 06/08/2020 0807   BUN 17 06/08/2020 0807   CREATININE 1.32 (H) 06/08/2020 0807   CREATININE 1.21 12/26/2017 1007      Component Value Date/Time   CALCIUM 8.9 06/08/2020 0807   ALKPHOS 57 06/08/2020 0807   AST 15 06/08/2020 0807   ALT 10 06/08/2020 0807   BILITOT 0.3 06/08/2020 0807     IMPRESSION: 1. No evidence of metastatic disease to  the chest. 2. Marked interval response to therapy still with some mildly enlarged lymph nodes in the retroperitoneum and pelvis though in the range of 1 cm as compared to the bulky nodes that were seen on the prior study, essentially near complete resolution of nodal disease. 3. Stranding extending into the base of the penis and about the suprapubic soft tissue/mons with similar appearance with respect to visualized portions. Likely related to edema in the setting of diffuse nodal involvement from bladder cancer. Correlate with any signs of cellulitis. 4. Dilation of the LEFT renal pelvis persists despite presence of nephroureteral stent. Proximal loop reconstituted in the renal pelvis. Despite this there is symmetric renal enhancement even in the setting of persistent LEFT-sided hydronephrosis. LEFT-sided dilation could be related to chronic obstruction and resultant capacious system though stent malfunction is a consideration. Despite dilation there is however symmetric renal enhancement. 5. Fullness of RIGHT renal pelvis without RIGHT-sided hydronephrosis. 6. Aortic atherosclerosis.   Impression and Plan:   60 year old man with:  1.   Stage IV high-grade urothelial carcinoma with retroperitoneal lymph node involvement diagnosed in November 2021.  His disease status was updated at this time and treatment options were discussed.  CT scan obtained on June 27, 2020 was personally reviewed and showed excellent response to chemotherapy with near resolution of his metastatic disease.  His lymphadenopathy has regressed consistent with positive response to therapy.  Risks and benefits of continuing immunotherapy were reviewed at this time.  At this time I am in favor of continuing therapy until he has a complete response and that keep him on maintenance for at least is here for consideration of discontinuation of therapy.  He develops relapsed disease in the future on immunotherapy  antibody drug conjugate or oral targeted therapy will be his next options.  He is agreeable to proceed at this time.   2. IV access: Peripheral veins currently in use without any issues.  3. Antiemetics: Compazine is available to him without any nausea or vomiting.   4. Renal function surveillance:  He does have left hydronephrosis related to his malignancy with kidney function remained stable and a stent is in place.  5. Goals of care: Is disease might not be curable but aggressive measures are warranted at this time.  6.  Immune mediated complications: We will continue to educate him about potential complication related pneumonitis colitis thyroid disease.  7. Follow-up: In 6 weeks for the next cycle of therapy.  30  minutes were dedicated to this visit.  The time was spent on reviewing imaging studies, reviewing laboratory data and outlining treatment options for the future.   Zola Button, MD 06/29/2020 8:02 AM

## 2020-08-10 ENCOUNTER — Inpatient Hospital Stay: Payer: 59 | Attending: Oncology

## 2020-08-10 ENCOUNTER — Other Ambulatory Visit: Payer: Self-pay

## 2020-08-10 ENCOUNTER — Inpatient Hospital Stay (HOSPITAL_BASED_OUTPATIENT_CLINIC_OR_DEPARTMENT_OTHER): Payer: 59 | Admitting: Oncology

## 2020-08-10 ENCOUNTER — Inpatient Hospital Stay: Payer: 59

## 2020-08-10 VITALS — BP 152/79 | HR 74 | Temp 97.7°F | Resp 17 | Ht 72.0 in | Wt 246.9 lb

## 2020-08-10 DIAGNOSIS — Z79899 Other long term (current) drug therapy: Secondary | ICD-10-CM | POA: Diagnosis not present

## 2020-08-10 DIAGNOSIS — R590 Localized enlarged lymph nodes: Secondary | ICD-10-CM | POA: Diagnosis not present

## 2020-08-10 DIAGNOSIS — Z5112 Encounter for antineoplastic immunotherapy: Secondary | ICD-10-CM | POA: Diagnosis present

## 2020-08-10 DIAGNOSIS — C679 Malignant neoplasm of bladder, unspecified: Secondary | ICD-10-CM | POA: Diagnosis not present

## 2020-08-10 DIAGNOSIS — C772 Secondary and unspecified malignant neoplasm of intra-abdominal lymph nodes: Secondary | ICD-10-CM | POA: Insufficient documentation

## 2020-08-10 DIAGNOSIS — C670 Malignant neoplasm of trigone of bladder: Secondary | ICD-10-CM

## 2020-08-10 DIAGNOSIS — N1330 Unspecified hydronephrosis: Secondary | ICD-10-CM | POA: Insufficient documentation

## 2020-08-10 LAB — CBC WITH DIFFERENTIAL (CANCER CENTER ONLY)
Abs Immature Granulocytes: 0.02 10*3/uL (ref 0.00–0.07)
Basophils Absolute: 0.1 10*3/uL (ref 0.0–0.1)
Basophils Relative: 1 %
Eosinophils Absolute: 0.3 10*3/uL (ref 0.0–0.5)
Eosinophils Relative: 6 %
HCT: 39.5 % (ref 39.0–52.0)
Hemoglobin: 13.2 g/dL (ref 13.0–17.0)
Immature Granulocytes: 0 %
Lymphocytes Relative: 36 %
Lymphs Abs: 1.9 10*3/uL (ref 0.7–4.0)
MCH: 28.9 pg (ref 26.0–34.0)
MCHC: 33.4 g/dL (ref 30.0–36.0)
MCV: 86.4 fL (ref 80.0–100.0)
Monocytes Absolute: 0.6 10*3/uL (ref 0.1–1.0)
Monocytes Relative: 10 %
Neutro Abs: 2.5 10*3/uL (ref 1.7–7.7)
Neutrophils Relative %: 47 %
Platelet Count: 129 10*3/uL — ABNORMAL LOW (ref 150–400)
RBC: 4.57 MIL/uL (ref 4.22–5.81)
RDW: 12.3 % (ref 11.5–15.5)
WBC Count: 5.4 10*3/uL (ref 4.0–10.5)
nRBC: 0 % (ref 0.0–0.2)

## 2020-08-10 LAB — CMP (CANCER CENTER ONLY)
ALT: 13 U/L (ref 0–44)
AST: 16 U/L (ref 15–41)
Albumin: 4.1 g/dL (ref 3.5–5.0)
Alkaline Phosphatase: 50 U/L (ref 38–126)
Anion gap: 10 (ref 5–15)
BUN: 20 mg/dL (ref 6–20)
CO2: 24 mmol/L (ref 22–32)
Calcium: 8.9 mg/dL (ref 8.9–10.3)
Chloride: 107 mmol/L (ref 98–111)
Creatinine: 1.31 mg/dL — ABNORMAL HIGH (ref 0.61–1.24)
GFR, Estimated: >60 mL/min (ref >60)
Glucose, Bld: 135 mg/dL — ABNORMAL HIGH (ref 70–99)
Potassium: 3.7 mmol/L (ref 3.5–5.1)
Sodium: 141 mmol/L (ref 135–145)
Total Bilirubin: 0.3 mg/dL (ref 0.3–1.2)
Total Protein: 7.4 g/dL (ref 6.5–8.1)

## 2020-08-10 LAB — TSH: TSH: 2.226 u[IU]/mL (ref 0.320–4.118)

## 2020-08-10 MED ORDER — SODIUM CHLORIDE 0.9 % IV SOLN
400.0000 mg | Freq: Once | INTRAVENOUS | Status: AC
  Administered 2020-08-10: 400 mg via INTRAVENOUS
  Filled 2020-08-10: qty 16

## 2020-08-10 MED ORDER — SODIUM CHLORIDE 0.9 % IV SOLN
Freq: Once | INTRAVENOUS | Status: AC
  Filled 2020-08-10: qty 250

## 2020-08-10 NOTE — Patient Instructions (Signed)
[facility] Discharge Instructions for Patients Receiving Chemotherapy  Today you received the following chemotherapy agents: Keytruda  To help prevent nausea and vomiting after your treatment, we encourage you to take your nausea medication as directed.  If you develop nausea and vomiting that is not controlled by your nausea medication, call the clinic.   BELOW ARE SYMPTOMS THAT SHOULD BE REPORTED IMMEDIATELY:  *FEVER GREATER THAN 100.5 F  *CHILLS WITH OR WITHOUT FEVER  NAUSEA AND VOMITING THAT IS NOT CONTROLLED WITH YOUR NAUSEA MEDICATION  *UNUSUAL SHORTNESS OF BREATH  *UNUSUAL BRUISING OR BLEEDING  TENDERNESS IN MOUTH AND THROAT WITH OR WITHOUT PRESENCE OF ULCERS  *URINARY PROBLEMS  *BOWEL PROBLEMS  UNUSUAL RASH Items with * indicate a potential emergency and should be followed up as soon as possible.  Feel free to call the clinic should you have any questions or concerns. The clinic phone number is [phone].  Please show the CHEMO ALERT CARD at check-in to the Emergency Department and triage nurse.  Pembrolizumab injection What is this medicine? PEMBROLIZUMAB (pem broe liz ue mab) is a monoclonal antibody. It is used to treat certain types of cancer. This medicine may be used for other purposes; ask your health care provider or pharmacist if you have questions. COMMON BRAND NAME(S): Keytruda What should I tell my health care provider before I take this medicine? They need to know if you have any of these conditions:  autoimmune diseases like Crohn's disease, ulcerative colitis, or lupus  have had or planning to have an allogeneic stem cell transplant (uses someone else's stem cells)  history of organ transplant  history of chest radiation  nervous system problems like myasthenia gravis or Guillain-Barre syndrome  an unusual or allergic reaction to pembrolizumab, other medicines, foods, dyes, or preservatives  pregnant or trying to get  pregnant  breast-feeding How should I use this medicine? This medicine is for infusion into a vein. It is given by a health care professional in a hospital or clinic setting. A special MedGuide will be given to you before each treatment. Be sure to read this information carefully each time. Talk to your pediatrician regarding the use of this medicine in children. While this drug may be prescribed for children as young as 6 months for selected conditions, precautions do apply. Overdosage: If you think you have taken too much of this medicine contact a poison control center or emergency room at once. NOTE: This medicine is only for you. Do not share this medicine with others. What if I miss a dose? It is important not to miss your dose. Call your doctor or health care professional if you are unable to keep an appointment. What may interact with this medicine? Interactions have not been studied. This list may not describe all possible interactions. Give your health care provider a list of all the medicines, herbs, non-prescription drugs, or dietary supplements you use. Also tell them if you smoke, drink alcohol, or use illegal drugs. Some items may interact with your medicine. What should I watch for while using this medicine? Your condition will be monitored carefully while you are receiving this medicine. You may need blood work done while you are taking this medicine. Do not become pregnant while taking this medicine or for 4 months after stopping it. Women should inform their doctor if they wish to become pregnant or think they might be pregnant. There is a potential for serious side effects to an unborn child. Talk to your   health care professional or pharmacist for more information. Do not breast-feed an infant while taking this medicine or for 4 months after the last dose. What side effects may I notice from receiving this medicine? Side effects that you should report to your doctor or health  care professional as soon as possible:  allergic reactions like skin rash, itching or hives, swelling of the face, lips, or tongue  bloody or black, tarry  breathing problems  changes in vision  chest pain  chills  confusion  constipation  cough  diarrhea  dizziness or feeling faint or lightheaded  fast or irregular heartbeat  fever  flushing  joint pain  low blood counts - this medicine may decrease the number of white blood cells, red blood cells and platelets. You may be at increased risk for infections and bleeding.  muscle pain  muscle weakness  pain, tingling, numbness in the hands or feet  persistent headache  redness, blistering, peeling or loosening of the skin, including inside the mouth  signs and symptoms of high blood sugar such as dizziness; dry mouth; dry skin; fruity breath; nausea; stomach pain; increased hunger or thirst; increased urination  signs and symptoms of kidney injury like trouble passing urine or change in the amount of urine  signs and symptoms of liver injury like dark urine, light-colored stools, loss of appetite, nausea, right upper belly pain, yellowing of the eyes or skin  sweating  swollen lymph nodes  weight loss Side effects that usually do not require medical attention (report to your doctor or health care professional if they continue or are bothersome):  decreased appetite  hair loss  tiredness This list may not describe all possible side effects. Call your doctor for medical advice about side effects. You may report side effects to FDA at 1-800-FDA-1088. Where should I keep my medicine? This drug is given in a hospital or clinic and will not be stored at home. NOTE: This sheet is a summary. It may not cover all possible information. If you have questions about this medicine, talk to your doctor, pharmacist, or health care provider.  2021 Elsevier/Gold Standard (2019-02-23 21:44:53)  

## 2020-08-10 NOTE — Progress Notes (Signed)
Hematology and Oncology Follow Up   Edward [name] [id number] 12-21-1960 60 y.o. 08/10/2020 8:09 AM Edward [name], MDGreene, Edward Patrick, MD      Principle Diagnosis: 60 year old man with stage IV high-grade urothelial carcinoma of the bladder diagnosed in November 2021.  He presented with retroperitoneal, pelvic and inguinal adenopathy with tumor is PD-L1 positive with score of 100.    Prior Therapy:   He is status post cystoscopy, ureteroscopy as well as a left ureteral stent placement on March 21, 2020.  This was performed by Edward [name].  Chemotherapy utilizing gemcitabine and cisplatin started on March 27, 2020.  He completed 3 cycles of therapy.  Current therapy: Pembrolizumab 200 mg every 3 weeks.  Cycle 1 started on 06/08/2020.  He is currently on 400 mg every 6 weeks.  He presents today for the next cycle of therapy.  Interim History: Edward [name] presents today for repeat evaluation.  Since the last visit, he reports no major complaints at this time.  He continues to enjoy excellent quality of life without any new complaints.  He denies any nausea, vomiting or abdominal pain.  He denies any complications related to Pembrolizumab.  He denies any fatigue skin rash or pruritus.  His edema has resolved at this time except for very mild suprapubic component.   Medications: Updated on review. Current Outpatient Medications  Medication Sig Dispense Refill  . Cholecalciferol (VITAMIN D-3 PO) Take by mouth.    Marland Kitchen ibuprofen (ADVIL) 200 MG tablet Take 800 mg by mouth every 6 (six) hours as needed for headache or moderate pain.    . Multiple Vitamins-Minerals (ZINC PO) Take by mouth daily.    . ondansetron (ZOFRAN) 8 MG tablet Take 1 tablet (8 mg total) by mouth every 8 (eight) hours as needed for nausea or vomiting. 20 tablet 1  . phenazopyridine (PYRIDIUM) 200 MG tablet Take 1 tablet (200 mg total) by mouth 3 (three) times daily as needed for pain. 10 tablet 0  . prochlorperazine  (COMPAZINE) 10 MG tablet Take 1 tablet (10 mg total) by mouth every 6 (six) hours as needed for nausea or vomiting. (Patient not taking: No sig reported) 30 tablet 0  . tamsulosin (FLOMAX) 0.4 MG CAPS capsule Take 1 capsule (0.4 mg total) by mouth daily. 30 capsule 11  . traMADol (ULTRAM) 50 MG tablet Take 1-2 tablets (50-100 mg total) by mouth every 6 (six) hours as needed for moderate pain. 15 tablet 0   No current facility-administered medications for this visit.     Allergies: No Known Allergies  Physical exam   Blood pressure (!) 152/79, pulse 74, temperature 97.7 F (36.5 C), temperature source Tympanic, resp. rate 17, height 6' (1.829 m), weight 246 lb 14.4 oz (112 kg), SpO2 99 %.    ECOG 1    General appearance: Comfortable appearing without any discomfort Head: Normocephalic without any trauma Oropharynx: Mucous membranes are moist and pink without any thrush or ulcers. Eyes: Pupils are equal and round reactive to light. Lymph nodes: No cervical, supraclavicular, inguinal or axillary lymphadenopathy.   Heart:regular rate and rhythm.  S1 and S2 without leg edema. Lung: Clear without any rhonchi or wheezes.  No dullness to percussion. Abdomin: Soft, nontender, nondistended with good bowel sounds.  No hepatosplenomegaly. Musculoskeletal: No joint deformity or effusion.  Full range of motion noted. Neurological: No deficits noted on motor, sensory and deep tendon reflex exam. Skin: No petechial rash or dryness.  Appeared moist.  Lab Results: Lab Results  Component Value Date   WBC 5.7 06/29/2020   HGB 12.0 (L) 06/29/2020   HCT 36.0 (L) 06/29/2020   MCV 86.5 06/29/2020   PLT 150 06/29/2020     Chemistry      Component Value Date/Time   NA 140 06/29/2020 0837   K 3.9 06/29/2020 0837   CL 105 06/29/2020 0837   CO2 23 06/29/2020 0837   BUN 18 06/29/2020 0837   CREATININE 1.28 (H) 06/29/2020 0837   CREATININE 1.21 12/26/2017 1007      Component  Value Date/Time   CALCIUM 9.2 06/29/2020 0837   ALKPHOS 55 06/29/2020 0837   AST 16 06/29/2020 0837   ALT 12 06/29/2020 0837   BILITOT 0.3 06/29/2020 0837     .   Impression and Plan:   60 year old man with:  1.   Bladder cancer diagnosed in November 2021.  He presented with stage IV high-grade urothelial carcinoma with retroperitoneal lymphadenopathy.  He achieved excellent response to systemic chemotherapy and currently on maintenance immunotherapy.  Risks and benefits of proceeding with Pembrolizumab today were reviewed.  Complications that include immune mediated issues, arthralgias, skin rash and GI toxicities were reviewed.  He is agreeable to proceed and the plan is to update his staging scans in July 2022.  He is planning a trip to [place] where he is likely to obtain scans there.  2. IV access: No issues with peripheral veins at this time.  These will continue to be in use for the time being.  3. Antiemetics: Compazine is available to him.  No nausea or vomiting reported.   4. Renal function surveillance:  His kidney function continues to improve at this time.  He is following with Edward [name] with continuous improvement of his hydronephrosis.  5. Goals of care: Aggressive measures are warranted given his excellent performance status.  His disease is likely incurable however.  6.  Immune mediated complications: Continue to educate him about potential issues including pneumonitis, colitis, thyroid disease among others.  7. Follow-up: He will return in 6 weeks for repeat evaluation.  30  minutes were spent on this encounter.  The time was dedicated to reviewing his disease status, discussing treatment options and addressing complications related to his cancer and cancer therapy.   Edward Button, MD 08/10/2020 8:09 AM

## 2020-09-28 ENCOUNTER — Inpatient Hospital Stay: Payer: 59 | Attending: Oncology

## 2020-09-28 ENCOUNTER — Other Ambulatory Visit: Payer: Self-pay

## 2020-09-28 ENCOUNTER — Inpatient Hospital Stay (HOSPITAL_BASED_OUTPATIENT_CLINIC_OR_DEPARTMENT_OTHER): Payer: 59 | Admitting: Oncology

## 2020-09-28 ENCOUNTER — Inpatient Hospital Stay: Payer: 59

## 2020-09-28 VITALS — BP 144/84 | HR 70 | Temp 97.9°F | Resp 18 | Wt 251.8 lb

## 2020-09-28 DIAGNOSIS — C670 Malignant neoplasm of trigone of bladder: Secondary | ICD-10-CM

## 2020-09-28 DIAGNOSIS — C772 Secondary and unspecified malignant neoplasm of intra-abdominal lymph nodes: Secondary | ICD-10-CM | POA: Insufficient documentation

## 2020-09-28 DIAGNOSIS — Z5112 Encounter for antineoplastic immunotherapy: Secondary | ICD-10-CM | POA: Insufficient documentation

## 2020-09-28 DIAGNOSIS — Z79899 Other long term (current) drug therapy: Secondary | ICD-10-CM | POA: Insufficient documentation

## 2020-09-28 DIAGNOSIS — C679 Malignant neoplasm of bladder, unspecified: Secondary | ICD-10-CM | POA: Insufficient documentation

## 2020-09-28 LAB — CMP (CANCER CENTER ONLY)
ALT: 23 U/L (ref 0–44)
AST: 21 U/L (ref 15–41)
Albumin: 4.1 g/dL (ref 3.5–5.0)
Alkaline Phosphatase: 59 U/L (ref 38–126)
Anion gap: 11 (ref 5–15)
BUN: 21 mg/dL — ABNORMAL HIGH (ref 6–20)
CO2: 25 mmol/L (ref 22–32)
Calcium: 9.3 mg/dL (ref 8.9–10.3)
Chloride: 104 mmol/L (ref 98–111)
Creatinine: 1.4 mg/dL — ABNORMAL HIGH (ref 0.61–1.24)
GFR, Estimated: 58 mL/min — ABNORMAL LOW (ref >60)
Glucose, Bld: 106 mg/dL — ABNORMAL HIGH (ref 70–99)
Potassium: 3.8 mmol/L (ref 3.5–5.1)
Sodium: 140 mmol/L (ref 135–145)
Total Bilirubin: 0.3 mg/dL (ref 0.3–1.2)
Total Protein: 7.7 g/dL (ref 6.5–8.1)

## 2020-09-28 LAB — CBC WITH DIFFERENTIAL (CANCER CENTER ONLY)
Abs Immature Granulocytes: 0.01 10*3/uL (ref 0.00–0.07)
Basophils Absolute: 0.1 10*3/uL (ref 0.0–0.1)
Basophils Relative: 1 %
Eosinophils Absolute: 0.4 10*3/uL (ref 0.0–0.5)
Eosinophils Relative: 7 %
HCT: 42.4 % (ref 39.0–52.0)
Hemoglobin: 14.3 g/dL (ref 13.0–17.0)
Immature Granulocytes: 0 %
Lymphocytes Relative: 36 %
Lymphs Abs: 2.2 10*3/uL (ref 0.7–4.0)
MCH: 28.3 pg (ref 26.0–34.0)
MCHC: 33.7 g/dL (ref 30.0–36.0)
MCV: 84 fL (ref 80.0–100.0)
Monocytes Absolute: 0.7 10*3/uL (ref 0.1–1.0)
Monocytes Relative: 11 %
Neutro Abs: 2.8 10*3/uL (ref 1.7–7.7)
Neutrophils Relative %: 45 %
Platelet Count: 147 10*3/uL — ABNORMAL LOW (ref 150–400)
RBC: 5.05 MIL/uL (ref 4.22–5.81)
RDW: 12.3 % (ref 11.5–15.5)
WBC Count: 6.1 10*3/uL (ref 4.0–10.5)
nRBC: 0 % (ref 0.0–0.2)

## 2020-09-28 LAB — TSH: TSH: 2.416 u[IU]/mL (ref 0.320–4.118)

## 2020-09-28 MED ORDER — SODIUM CHLORIDE 0.9 % IV SOLN
400.0000 mg | Freq: Once | INTRAVENOUS | Status: AC
  Administered 2020-09-28: 400 mg via INTRAVENOUS
  Filled 2020-09-28: qty 16

## 2020-09-28 MED ORDER — SODIUM CHLORIDE 0.9 % IV SOLN
Freq: Once | INTRAVENOUS | Status: AC
  Filled 2020-09-28: qty 250

## 2020-09-28 NOTE — Patient Instructions (Signed)
[facility] MEDICAL ONCOLOGY  Discharge Instructions: ?Thank you for choosing [facility] to provide your oncology and hematology care.  ? ?If you have a lab appointment with the Cancer Center, please go directly to the Cancer Center and check in at the registration area. ?  ?Wear comfortable clothing and clothing appropriate for easy access to any Portacath or PICC line.  ? ?We strive to give you quality time with your provider. You may need to reschedule your appointment if you arrive late (15 or more minutes).  Arriving late affects you and other patients whose appointments are after yours.  Also, if you miss three or more appointments without notifying the office, you may be dismissed from the clinic at the provider?s discretion.    ?  ?For prescription refill requests, have your pharmacy contact our office and allow 72 hours for refills to be completed.   ? ?Today you received the following chemotherapy and/or immunotherapy agents: Keytruda ?  ?To help prevent nausea and vomiting after your treatment, we encourage you to take your nausea medication as directed. ? ?BELOW ARE SYMPTOMS THAT SHOULD BE REPORTED IMMEDIATELY: ?*FEVER GREATER THAN 100.4 F (38 ?C) OR HIGHER ?*CHILLS OR SWEATING ?*NAUSEA AND VOMITING THAT IS NOT CONTROLLED WITH YOUR NAUSEA MEDICATION ?*UNUSUAL SHORTNESS OF BREATH ?*UNUSUAL BRUISING OR BLEEDING ?*URINARY PROBLEMS (pain or burning when urinating, or frequent urination) ?*BOWEL PROBLEMS (unusual diarrhea, constipation, pain near the anus) ?TENDERNESS IN MOUTH AND THROAT WITH OR WITHOUT PRESENCE OF ULCERS (sore throat, sores in mouth, or a toothache) ?UNUSUAL RASH, SWELLING OR PAIN  ?UNUSUAL VAGINAL DISCHARGE OR ITCHING  ? ?Items with * indicate a potential emergency and should be followed up as soon as possible or go to the Emergency Department if any problems should occur. ? ?Please show the CHEMOTHERAPY ALERT CARD or IMMUNOTHERAPY ALERT CARD at check-in to the  Emergency Department and triage nurse. ? ?Should you have questions after your visit or need to cancel or reschedule your appointment, please contact [facility] MEDICAL ONCOLOGY  Dept: [phone]  and follow the prompts.  Office hours are 8:00 a.m. to 4:30 p.m. Monday - Friday. Please note that voicemails left after 4:00 p.m. may not be returned until the following business day.  We are closed weekends and major holidays. You have access to a nurse at all times for urgent questions. Please call the main number to the clinic Dept: [phone] and follow the prompts. ? ? ?For any non-urgent questions, you may also contact your provider using MyChart. We now offer e-Visits for anyone 18 and older to request care online for non-urgent symptoms. For details visit [email or web address]. ?  ?Also download the MyChart app! Go to the app store, search "MyChart", open the app, select [facility], and log in with your MyChart username and password. ? ?Due to Covid, a mask is required upon entering the hospital/clinic. If you do not have a mask, one will be given to you upon arrival. For doctor visits, patients may have 1 support person aged 18 or older with them. For treatment visits, patients cannot have anyone with them due to current Covid guidelines and our immunocompromised population.  ? ?

## 2020-09-28 NOTE — Progress Notes (Signed)
Hematology and Oncology Follow Up   Sylvestre Rathgeber [id number] 01-13-1961 60 y.o. 09/28/2020 8:28 AM [name], MDGreene, [name], MD      Principle Diagnosis: 60 year old man with bladder cancer diagnosed in November 2021.  He presented with stage IV high-grade urothelial carcinoma with PD-L1 score of 100 and pelvic adenopathy.   He experienced complete response to therapy.   Prior Therapy:   He is status post cystoscopy, ureteroscopy as well as a left ureteral stent placement on March 21, 2020.  This was performed by Dr. Louis Meckel.  Chemotherapy utilizing gemcitabine and cisplatin started on March 27, 2020.  He completed 3 cycles of therapy.  Current therapy: Pembrolizumab 200 mg every 3 weeks.  Cycle 1 started on 06/08/2020.  He has been receiving 400 mg every 6 weeks starting with cycle 2 of therapy.  He is here for the subsequent treatment.  Interim History: [name] returns today for repeat evaluation.  Since her last visit, he reports no major changes in his health.  He continues to feel well without any major complaints.  He denies any nausea, fatigue or tiredness.  He denies any shortness of breath or difficulty breathing.  His exercise tolerance continues to improve.  He denies any skin rashes or lesions.  He denies any muscle weakness and his left thigh atrophy continues to improve.   Medications: Unchanged on review. Current Outpatient Medications  Medication Sig Dispense Refill   Cholecalciferol (VITAMIN D-3 PO) Take by mouth.     ibuprofen (ADVIL) 200 MG tablet Take 800 mg by mouth every 6 (six) hours as needed for headache or moderate pain.     Multiple Vitamins-Minerals (ZINC PO) Take by mouth daily.     ondansetron (ZOFRAN) 8 MG tablet Take 1 tablet (8 mg total) by mouth every 8 (eight) hours as needed for nausea or vomiting. 20 tablet 1   phenazopyridine (PYRIDIUM) 200 MG tablet Take 1 tablet (200 mg total) by mouth 3 (three) times daily as needed for pain.  10 tablet 0   prochlorperazine (COMPAZINE) 10 MG tablet Take 1 tablet (10 mg total) by mouth every 6 (six) hours as needed for nausea or vomiting. 30 tablet 0   tamsulosin (FLOMAX) 0.4 MG CAPS capsule Take 1 capsule (0.4 mg total) by mouth daily. 30 capsule 11   traMADol (ULTRAM) 50 MG tablet Take 1-2 tablets (50-100 mg total) by mouth every 6 (six) hours as needed for moderate pain. 15 tablet 0   No current facility-administered medications for this visit.     Allergies: No Known Allergies  Physical exam   Blood pressure (!) 144/84, pulse 70, temperature 97.9 F (36.6 C), temperature source Oral, resp. rate 18, weight 251 lb 12.8 oz (114.2 kg), SpO2 100 %.     ECOG 1     General appearance: Alert, awake without any distress. Head: Atraumatic without abnormalities Oropharynx: Without any thrush or ulcers. Eyes: No scleral icterus. Lymph nodes: No lymphadenopathy noted in the cervical, supraclavicular, or axillary nodes Heart:regular rate and rhythm, without any murmurs or gallops.   Lung: Clear to auscultation without any rhonchi, wheezes or dullness to percussion. Abdomin: Soft, nontender without any shifting dullness or ascites. Musculoskeletal: No clubbing or cyanosis.  Muscle strength and tone in the left quadrant appears normal. Neurological: No motor or sensory deficits. Skin: No rashes or lesions.          Lab Results: Lab Results  Component Value Date   WBC 5.4 08/10/2020   HGB  13.2 08/10/2020   HCT 39.5 08/10/2020   MCV 86.4 08/10/2020   PLT 129 (L) 08/10/2020     Chemistry      Component Value Date/Time   NA 141 08/10/2020 0805   K 3.7 08/10/2020 0805   CL 107 08/10/2020 0805   CO2 24 08/10/2020 0805   BUN 20 08/10/2020 0805   CREATININE 1.31 (H) 08/10/2020 0805   CREATININE 1.21 12/26/2017 1007      Component Value Date/Time   CALCIUM 8.9 08/10/2020 0805   ALKPHOS 50 08/10/2020 0805   AST 16 08/10/2020 0805   ALT 13 08/10/2020 0805    BILITOT 0.3 08/10/2020 0805     .   Impression and Plan:   60 year old man with:   1.   Stage IV high-grade urothelial carcinoma of the bladder with retroperitoneal lymphadenopathy diagnosed in 2021.  His disease status was updated at this time and treatment options were discussed.  He continues to experience excellent clinical benefit to Pembrolizumab with near resolution of his disease.  Risks and benefits of continuing this treatment were discussed at this time.  Complications including immune mediated issues, GI toxicity and dermatological concerns were reviewed.  I recommended continuing the same treatment and update staging scans before the next visit.  He is agreeable to proceed at this time.   2.  IV access: Peripheral veins are currently in use without any issues.   3.  Antiemetics: No nausea or vomiting reported at this time.    4.  Renal function surveillance: He has element of hydronephrosis and does not appear pathological.  His kidney function is close to normal range at this time.   5.  Goals of care: Therapy remains palliative but aggressive measures are warranted given his excellent response to therapy.   6.  Immune mediated complications: No issues reported including pneumonitis, colitis, hepatitis among others.  We will continue to monitor and educate about these complications.   7.  Follow-up: In 6 weeks for repeat evaluation and next cycle of therapy.   30  minutes were dedicated to this visit.  The time was spent on reviewing laboratory data, updating his disease status, discussing treatment choices and future plan of care discussion.   Zola Button, MD 09/28/2020 8:28 AM

## 2020-11-09 ENCOUNTER — Ambulatory Visit: Payer: 59 | Admitting: Oncology

## 2020-11-09 ENCOUNTER — Other Ambulatory Visit: Payer: 59

## 2020-11-09 ENCOUNTER — Ambulatory Visit: Payer: 59

## 2020-11-14 ENCOUNTER — Ambulatory Visit (HOSPITAL_COMMUNITY): Payer: 59

## 2020-11-16 ENCOUNTER — Other Ambulatory Visit: Payer: Self-pay | Admitting: Oncology

## 2020-11-16 ENCOUNTER — Telehealth: Payer: Self-pay

## 2020-11-16 ENCOUNTER — Inpatient Hospital Stay: Payer: 59

## 2020-11-16 ENCOUNTER — Inpatient Hospital Stay (HOSPITAL_BASED_OUTPATIENT_CLINIC_OR_DEPARTMENT_OTHER): Payer: 59 | Admitting: Oncology

## 2020-11-16 ENCOUNTER — Other Ambulatory Visit: Payer: Self-pay

## 2020-11-16 ENCOUNTER — Inpatient Hospital Stay: Payer: 59 | Attending: Oncology

## 2020-11-16 VITALS — BP 158/81 | HR 86 | Temp 98.6°F | Resp 20 | Ht 72.0 in | Wt 253.9 lb

## 2020-11-16 DIAGNOSIS — Z5112 Encounter for antineoplastic immunotherapy: Secondary | ICD-10-CM | POA: Insufficient documentation

## 2020-11-16 DIAGNOSIS — C670 Malignant neoplasm of trigone of bladder: Secondary | ICD-10-CM | POA: Diagnosis not present

## 2020-11-16 DIAGNOSIS — C679 Malignant neoplasm of bladder, unspecified: Secondary | ICD-10-CM | POA: Insufficient documentation

## 2020-11-16 DIAGNOSIS — C772 Secondary and unspecified malignant neoplasm of intra-abdominal lymph nodes: Secondary | ICD-10-CM | POA: Insufficient documentation

## 2020-11-16 DIAGNOSIS — Z79899 Other long term (current) drug therapy: Secondary | ICD-10-CM | POA: Diagnosis not present

## 2020-11-16 LAB — CBC WITH DIFFERENTIAL (CANCER CENTER ONLY)
Abs Immature Granulocytes: 0.03 10*3/uL (ref 0.00–0.07)
Basophils Absolute: 0.1 10*3/uL (ref 0.0–0.1)
Basophils Relative: 1 %
Eosinophils Absolute: 0.4 10*3/uL (ref 0.0–0.5)
Eosinophils Relative: 6 %
HCT: 39.6 % (ref 39.0–52.0)
Hemoglobin: 13.7 g/dL (ref 13.0–17.0)
Immature Granulocytes: 1 %
Lymphocytes Relative: 32 %
Lymphs Abs: 2.1 10*3/uL (ref 0.7–4.0)
MCH: 28.8 pg (ref 26.0–34.0)
MCHC: 34.6 g/dL (ref 30.0–36.0)
MCV: 83.4 fL (ref 80.0–100.0)
Monocytes Absolute: 0.6 10*3/uL (ref 0.1–1.0)
Monocytes Relative: 9 %
Neutro Abs: 3.4 10*3/uL (ref 1.7–7.7)
Neutrophils Relative %: 51 %
Platelet Count: 124 10*3/uL — ABNORMAL LOW (ref 150–400)
RBC: 4.75 MIL/uL (ref 4.22–5.81)
RDW: 12.7 % (ref 11.5–15.5)
WBC Count: 6.5 10*3/uL (ref 4.0–10.5)
nRBC: 0 % (ref 0.0–0.2)

## 2020-11-16 LAB — CMP (CANCER CENTER ONLY)
ALT: 24 U/L (ref 0–44)
AST: 21 U/L (ref 15–41)
Albumin: 4.2 g/dL (ref 3.5–5.0)
Alkaline Phosphatase: 65 U/L (ref 38–126)
Anion gap: 10 (ref 5–15)
BUN: 22 mg/dL — ABNORMAL HIGH (ref 6–20)
CO2: 24 mmol/L (ref 22–32)
Calcium: 9.3 mg/dL (ref 8.9–10.3)
Chloride: 106 mmol/L (ref 98–111)
Creatinine: 1.5 mg/dL — ABNORMAL HIGH (ref 0.61–1.24)
GFR, Estimated: 53 mL/min — ABNORMAL LOW (ref >60)
Glucose, Bld: 144 mg/dL — ABNORMAL HIGH (ref 70–99)
Potassium: 4.2 mmol/L (ref 3.5–5.1)
Sodium: 140 mmol/L (ref 135–145)
Total Bilirubin: 0.4 mg/dL (ref 0.3–1.2)
Total Protein: 7.8 g/dL (ref 6.5–8.1)

## 2020-11-16 LAB — TSH: TSH: 1.586 u[IU]/mL (ref 0.320–4.118)

## 2020-11-16 MED ORDER — SODIUM CHLORIDE 0.9 % IV SOLN
Freq: Once | INTRAVENOUS | Status: AC
  Filled 2020-11-16: qty 250

## 2020-11-16 MED ORDER — SODIUM CHLORIDE 0.9 % IV SOLN
400.0000 mg | Freq: Once | INTRAVENOUS | Status: AC
  Administered 2020-11-16: 400 mg via INTRAVENOUS
  Filled 2020-11-16: qty 16

## 2020-11-16 NOTE — Telephone Encounter (Signed)
I called and spoke with Edward [name] to make him aware of his CT scan that was authorized today and scheduled for 11/22/20. The patient reported that he was unavailable every day next week. I offered to call Centralized Scheduling to reschedule the scan but the patient was very unsure of when would be a good day this month to have the scan completed. I advised the patient to contact Centralized Scheduling at [phone] in order for him to schedule the CT for another time this month. The patient verbalized understanding. I encouraged the patient to contact us if he had any questions or concerns.

## 2020-11-16 NOTE — Progress Notes (Signed)
Hematology and Oncology Follow Up   Edward [name] [id number] 1960/12/04 60 y.o. 11/16/2020 10:12 AM Edward [name], MDGreene, [name], MD      Principle Diagnosis: 60 year old man with stage IV high-grade urothelial carcinoma involving pelvic lymphadenopathy diagnosed in November 2021.  He was found to havePD-L1 score of 100    Prior Therapy:   He is status post cystoscopy, ureteroscopy as well as a left ureteral stent placement on March 21, 2020.  This was performed by Dr. Louis [name].  Chemotherapy utilizing gemcitabine and cisplatin started on March 27, 2020.  He completed 3 cycles of therapy.  Current therapy: Pembrolizumab 200 mg every 3 weeks.  Cycle 1 started on 06/08/2020.  He has been receiving 400 mg every 6 weeks starting with cycle 2 of therapy.  He is here for the next cycle of therapy.  Interim History: Edward [name] presents today for a follow-up visit.  Since last visit, he reports that feeling well without any major complaints.  He denies any nausea, vomiting or abdominal pain.  He denies any worsening fatigue or tiredness.  He denies any complications related to Pembrolizumab.   Medications: Reviewed without changes. Current Outpatient Medications  Medication Sig Dispense Refill   Cholecalciferol (VITAMIN D-3 PO) Take by mouth.     ibuprofen (ADVIL) 200 MG tablet Take 800 mg by mouth every 6 (six) hours as needed for headache or moderate pain.     Multiple Vitamins-Minerals (ZINC PO) Take by mouth daily.     ondansetron (ZOFRAN) 8 MG tablet Take 1 tablet (8 mg total) by mouth every 8 (eight) hours as needed for nausea or vomiting. 20 tablet 1   phenazopyridine (PYRIDIUM) 200 MG tablet Take 1 tablet (200 mg total) by mouth 3 (three) times daily as needed for pain. 10 tablet 0   prochlorperazine (COMPAZINE) 10 MG tablet Take 1 tablet (10 mg total) by mouth every 6 (six) hours as needed for nausea or vomiting. 30 tablet 0   tamsulosin (FLOMAX) 0.4 MG CAPS capsule Take  1 capsule (0.4 mg total) by mouth daily. 30 capsule 11   traMADol (ULTRAM) 50 MG tablet Take 1-2 tablets (50-100 mg total) by mouth every 6 (six) hours as needed for moderate pain. 15 tablet 0   No current facility-administered medications for this visit.     Allergies: No Known Allergies  Physical exam     Blood pressure (!) 158/81, pulse 86, temperature 98.6 F (37 C), temperature source Oral, resp. rate 20, height 6' (1.829 m), weight 253 lb 14.4 oz (115.2 kg), SpO2 99 %.    ECOG 1    General appearance: Comfortable appearing without any discomfort Head: Normocephalic without any trauma Oropharynx: Mucous membranes are moist and pink without any thrush or ulcers. Eyes: Pupils are equal and round reactive to light. Lymph nodes: No cervical, supraclavicular, inguinal or axillary lymphadenopathy.   Heart:regular rate and rhythm.  S1 and S2 without leg edema. Lung: Clear without any rhonchi or wheezes.  No dullness to percussion. Abdomin: Soft, nontender, nondistended with good bowel sounds.  No hepatosplenomegaly. Musculoskeletal: No joint deformity or effusion.  Full range of motion noted. Neurological: No deficits noted on motor, sensory and deep tendon reflex exam. Skin: No petechial rash or dryness.  Appeared moist.            Lab Results: Lab Results  Component Value Date   WBC 6.1 09/28/2020   HGB 14.3 09/28/2020   HCT 42.4 09/28/2020   MCV 84.0 09/28/2020  PLT 147 (L) 09/28/2020     Chemistry      Component Value Date/Time   NA 140 09/28/2020 0834   K 3.8 09/28/2020 0834   CL 104 09/28/2020 0834   CO2 25 09/28/2020 0834   BUN 21 (H) 09/28/2020 0834   CREATININE 1.40 (H) 09/28/2020 0834   CREATININE 1.21 12/26/2017 1007      Component Value Date/Time   CALCIUM 9.3 09/28/2020 0834   ALKPHOS 59 09/28/2020 0834   AST 21 09/28/2020 0834   ALT 23 09/28/2020 0834   BILITOT 0.3 09/28/2020 0834     .   Impression and Plan:   60 year old man  with:   1.   Bladder cancer diagnosed in November 2021.  He presented with stage IV high-grade urothelial carcinoma.   He is currently on Pembrolizumab with excellent response to therapy clinically and by imaging studies pleated in March 2022.  Risks and benefits of continuing this treatment were discussed at this time.  Plan is to update his staging scans in the near future.  Different salvage therapy options including systemic chemotherapy, antibiotic drug conjugate as well as oral targeted therapy if he harbors the appropriate mutation.   2.  IV access: No issues reported with peripheral veins.   3.  Antiemetics: Compazine is available to him without any nausea or vomiting.    4.  Renal function surveillance: Kidney function remained stable we will continue to monitor after platinum therapy.   5.  Goals of care: His disease is incurable but aggressive measures are warranted given his excellent response.  6.  Immune mediated complications: He has not reported any complication clinic pneumonitis, colitis and thyroid disease.  We will continue to educate him about these potential complications.   7.  Follow-up: He will return in 6 weeks for a follow-up evaluation.   30  minutes were spent on this encounter.  The time was dedicated to reviewing laboratory data, disease status update, treatment choices and addressing complications related to cancer and cancer therapy.   Edward Button, MD 11/16/2020 10:12 AM

## 2020-11-22 ENCOUNTER — Other Ambulatory Visit (HOSPITAL_COMMUNITY): Payer: 59

## 2020-12-03 ENCOUNTER — Other Ambulatory Visit: Payer: Self-pay | Admitting: *Deleted

## 2020-12-03 DIAGNOSIS — C679 Malignant neoplasm of bladder, unspecified: Secondary | ICD-10-CM

## 2020-12-04 ENCOUNTER — Encounter (HOSPITAL_COMMUNITY): Payer: Self-pay

## 2020-12-04 ENCOUNTER — Other Ambulatory Visit: Payer: Self-pay

## 2020-12-04 ENCOUNTER — Other Ambulatory Visit: Payer: Self-pay | Admitting: Oncology

## 2020-12-04 ENCOUNTER — Ambulatory Visit (HOSPITAL_COMMUNITY)
Admission: RE | Admit: 2020-12-04 | Discharge: 2020-12-04 | Disposition: A | Discharge status: Home / Self Care | Payer: 59 | Source: Ambulatory Visit | Attending: Oncology | Admitting: Oncology

## 2020-12-04 DIAGNOSIS — C670 Malignant neoplasm of trigone of bladder: Secondary | ICD-10-CM | POA: Diagnosis present

## 2020-12-04 DIAGNOSIS — C679 Malignant neoplasm of bladder, unspecified: Secondary | ICD-10-CM | POA: Diagnosis present

## 2020-12-04 MED ORDER — SODIUM CHLORIDE 0.9 % IV SOLN
INTRAVENOUS | Status: AC
  Filled 2020-12-04: qty 250

## 2020-12-04 MED ORDER — IOHEXOL 350 MG/ML SOLN
100.0000 mL | Freq: Once | INTRAVENOUS | Status: AC | PRN
  Administered 2020-12-04: 100 mL via INTRAVENOUS

## 2020-12-06 ENCOUNTER — Telehealth: Payer: Self-pay | Admitting: Oncology

## 2020-12-06 NOTE — Telephone Encounter (Signed)
Sch per 8/12 los, pt aware

## 2020-12-20 ENCOUNTER — Other Ambulatory Visit (HOSPITAL_COMMUNITY): Payer: Self-pay

## 2020-12-28 ENCOUNTER — Inpatient Hospital Stay: Payer: 59

## 2020-12-28 ENCOUNTER — Inpatient Hospital Stay: Payer: 59 | Attending: Oncology

## 2020-12-28 ENCOUNTER — Inpatient Hospital Stay (HOSPITAL_BASED_OUTPATIENT_CLINIC_OR_DEPARTMENT_OTHER): Payer: 59 | Admitting: Oncology

## 2020-12-28 ENCOUNTER — Other Ambulatory Visit: Payer: Self-pay

## 2020-12-28 VITALS — BP 166/85 | HR 85 | Temp 99.0°F | Resp 17 | Ht 72.0 in | Wt 258.3 lb

## 2020-12-28 DIAGNOSIS — C679 Malignant neoplasm of bladder, unspecified: Secondary | ICD-10-CM

## 2020-12-28 DIAGNOSIS — Z5112 Encounter for antineoplastic immunotherapy: Secondary | ICD-10-CM | POA: Diagnosis present

## 2020-12-28 DIAGNOSIS — C670 Malignant neoplasm of trigone of bladder: Secondary | ICD-10-CM

## 2020-12-28 DIAGNOSIS — C772 Secondary and unspecified malignant neoplasm of intra-abdominal lymph nodes: Secondary | ICD-10-CM | POA: Insufficient documentation

## 2020-12-28 DIAGNOSIS — Z9221 Personal history of antineoplastic chemotherapy: Secondary | ICD-10-CM | POA: Insufficient documentation

## 2020-12-28 DIAGNOSIS — Z79899 Other long term (current) drug therapy: Secondary | ICD-10-CM | POA: Insufficient documentation

## 2020-12-28 LAB — CBC WITH DIFFERENTIAL (CANCER CENTER ONLY)
Abs Immature Granulocytes: 0.02 10*3/uL (ref 0.00–0.07)
Basophils Absolute: 0.1 10*3/uL (ref 0.0–0.1)
Basophils Relative: 1 %
Eosinophils Absolute: 0.4 10*3/uL (ref 0.0–0.5)
Eosinophils Relative: 6 %
HCT: 43 % (ref 39.0–52.0)
Hemoglobin: 14.5 g/dL (ref 13.0–17.0)
Immature Granulocytes: 0 %
Lymphocytes Relative: 29 %
Lymphs Abs: 2 10*3/uL (ref 0.7–4.0)
MCH: 28.3 pg (ref 26.0–34.0)
MCHC: 33.7 g/dL (ref 30.0–36.0)
MCV: 83.8 fL (ref 80.0–100.0)
Monocytes Absolute: 0.6 10*3/uL (ref 0.1–1.0)
Monocytes Relative: 9 %
Neutro Abs: 3.7 10*3/uL (ref 1.7–7.7)
Neutrophils Relative %: 55 %
Platelet Count: 157 10*3/uL (ref 150–400)
RBC: 5.13 MIL/uL (ref 4.22–5.81)
RDW: 12.5 % (ref 11.5–15.5)
WBC Count: 6.8 10*3/uL (ref 4.0–10.5)
nRBC: 0 % (ref 0.0–0.2)

## 2020-12-28 LAB — CMP (CANCER CENTER ONLY)
ALT: 21 U/L (ref 0–44)
AST: 20 U/L (ref 15–41)
Albumin: 4.5 g/dL (ref 3.5–5.0)
Alkaline Phosphatase: 62 U/L (ref 38–126)
Anion gap: 10 (ref 5–15)
BUN: 22 mg/dL — ABNORMAL HIGH (ref 6–20)
CO2: 25 mmol/L (ref 22–32)
Calcium: 9.9 mg/dL (ref 8.9–10.3)
Chloride: 105 mmol/L (ref 98–111)
Creatinine: 1.46 mg/dL — ABNORMAL HIGH (ref 0.61–1.24)
GFR, Estimated: 55 mL/min — ABNORMAL LOW (ref >60)
Glucose, Bld: 119 mg/dL — ABNORMAL HIGH (ref 70–99)
Potassium: 4 mmol/L (ref 3.5–5.1)
Sodium: 140 mmol/L (ref 135–145)
Total Bilirubin: 0.4 mg/dL (ref 0.3–1.2)
Total Protein: 8.2 g/dL — ABNORMAL HIGH (ref 6.5–8.1)

## 2020-12-28 LAB — TSH: TSH: 1.756 u[IU]/mL (ref 0.320–4.118)

## 2020-12-28 MED ORDER — SODIUM CHLORIDE 0.9 % IV SOLN: Freq: Once | INTRAVENOUS | Status: AC

## 2020-12-28 MED ORDER — SODIUM CHLORIDE 0.9 % IV SOLN
400.0000 mg | Freq: Once | INTRAVENOUS | Status: AC
  Administered 2020-12-28: 400 mg via INTRAVENOUS
  Filled 2020-12-28: qty 16

## 2020-12-28 NOTE — Progress Notes (Signed)
Hematology and Oncology Follow Up   Edward [name] [id number] Sep 21, 1960 60 y.o. 12/28/2020 12:56 PM [name], MDGreene, [name], MD      Principle Diagnosis: 60 year old man with bladder cancer diagnosed in November 2021.  He presented with stage IV high-grade urothelial carcinoma involving pelvic lymph nodes with tumor showed PD-L1 score of 100.    Prior Therapy:   He is status post cystoscopy, ureteroscopy as well as a left ureteral stent placement on March 21, 2020.  This was performed by Dr. Louis Meckel.  Chemotherapy utilizing gemcitabine and cisplatin started on March 27, 2020.  He completed 3 cycles of therapy.  Current therapy: Pembrolizumab 200 mg every 3 weeks.  Cycle 1 started on 06/08/2020.  He has been receiving 400 mg every 6 weeks starting with cycle 2 of therapy.  He is here for the next cycle of therapy.  Interim History: [name] returns today for repeat evaluation.  Since last visit, he reports no major changes in his health.  He continues to tolerate Pembrolizumab without any recent side effects.  He denies any nausea, vomiting or abdominal pain.  He denies any worsening neuropathy.  He denies any hospitalizations or illnesses.   Medications: Updated on review. Current Outpatient Medications  Medication Sig Dispense Refill   Cholecalciferol (VITAMIN D-3 PO) Take by mouth.     ibuprofen (ADVIL) 200 MG tablet Take 800 mg by mouth every 6 (six) hours as needed for headache or moderate pain.     Multiple Vitamins-Minerals (ZINC PO) Take by mouth daily.     ondansetron (ZOFRAN) 8 MG tablet Take 1 tablet (8 mg total) by mouth every 8 (eight) hours as needed for nausea or vomiting. 20 tablet 1   phenazopyridine (PYRIDIUM) 200 MG tablet Take 1 tablet (200 mg total) by mouth 3 (three) times daily as needed for pain. 10 tablet 0   prochlorperazine (COMPAZINE) 10 MG tablet Take 1 tablet (10 mg total) by mouth every 6 (six) hours as needed for nausea or vomiting.  30 tablet 0   tamsulosin (FLOMAX) 0.4 MG CAPS capsule Take 1 capsule (0.4 mg total) by mouth daily. 30 capsule 11   traMADol (ULTRAM) 50 MG tablet Take 1-2 tablets (50-100 mg total) by mouth every 6 (six) hours as needed for moderate pain. 15 tablet 0   No current facility-administered medications for this visit.     Allergies: No Known Allergies  Physical exam     Blood pressure (!) 166/85, pulse 85, temperature 99 F (37.2 C), temperature source Oral, resp. rate 17, height 6' (1.829 m), weight 258 lb 4.8 oz (117.2 kg), SpO2 98 %.     ECOG 1    General appearance: Alert, awake without any distress. Head: Atraumatic without abnormalities Oropharynx: Without any thrush or ulcers. Eyes: No scleral icterus. Lymph nodes: No lymphadenopathy noted in the cervical, supraclavicular, or axillary nodes Heart:regular rate and rhythm, without any murmurs or gallops.   Lung: Clear to auscultation without any rhonchi, wheezes or dullness to percussion. Abdomin: Soft, nontender without any shifting dullness or ascites. Musculoskeletal: No clubbing or cyanosis. Neurological: No motor or sensory deficits. Skin: No rashes or lesions.            Lab Results: Lab Results  Component Value Date   WBC 6.5 11/16/2020   HGB 13.7 11/16/2020   HCT 39.6 11/16/2020   MCV 83.4 11/16/2020   PLT 124 (L) 11/16/2020     Chemistry      Component Value Date/Time  NA 140 11/16/2020 1008   K 4.2 11/16/2020 1008   CL 106 11/16/2020 1008   CO2 24 11/16/2020 1008   BUN 22 (H) 11/16/2020 1008   CREATININE 1.50 (H) 11/16/2020 1008   CREATININE 1.21 12/26/2017 1007      Component Value Date/Time   CALCIUM 9.3 11/16/2020 1008   ALKPHOS 65 11/16/2020 1008   AST 21 11/16/2020 1008   ALT 24 11/16/2020 1008   BILITOT 0.4 11/16/2020 1008     .IMPRESSION: 1. Interval decrease in size of the retroperitoneal abdominal and iliac chain/pelvic sidewall adenopathy. 2. No evidence of new or  progressive disease in the chest abdomen or pelvis. 3. Interval removal of the left-sided ureteral stent with decreased now minimal fullness of the collecting system. 4. Similar stranding at the base of the penis and about the suprapubic soft tissue/mons, nonspecific. 5.  Aortic Atherosclerosis (ICD10-I70.0).     Impression and Plan:   60 year old man with:   1.   Stage IV high-grade urothelial carcinoma arising from the bladder diagnosed in November 2021.  His disease status was updated at this time and treatment choices were reviewed.  CT scan obtained on December 04, 2020 continues to show positive response to therapy.  Risks and benefits of continuing Pembrolizumab were reiterated at this time.  Complications including GI toxicity, immune mediated complications among others were reiterated.  He is agreeable to continuing the plan is to complete close to 2 years of immunotherapy pending his response.  We will update his staging scans in 3 to 4 months.   2.  IV access: No issues reported with his peripheral veins at this time.  Port-A-Cath option has been deferred.   3.  Antiemetics: No nausea or vomiting reported at this time.  Compazine is available to him.    4.  Renal function surveillance: Creatinine clearance remains at baseline 55 cc/min.   5.  Goals of care: Therapy remains palliative although aggressive measures are warranted given his excellent performance status and excellent response.  6.  Immune mediated complications: I continue to educate him about potential issues including pneumonitis, colitis, hypophysitis among others.   7.  Follow-up: In 6 weeks for the next evaluation and infusion.   30  minutes were dedicated to this visit.  The time spent on reviewing laboratory data, disease status update, treatment choices discussion and future plan of care review.   Zola Button, MD 12/28/2020 12:56 PM

## 2020-12-28 NOTE — Patient Instructions (Signed)
[facility]  Discharge Instructions: Thank you for choosing [facility] to provide your oncology and hematology care.   If you have a lab appointment with the Cancer Center, please go directly to the Cancer Center and check in at the registration area.   Wear comfortable clothing and clothing appropriate for easy access to any Portacath or PICC line.   We strive to give you quality time with your provider. You may need to reschedule your appointment if you arrive late (15 or more minutes).  Arriving late affects you and other patients whose appointments are after yours.  Also, if you miss three or more appointments without notifying the office, you may be dismissed from the clinic at the provider's discretion.      For prescription refill requests, have your pharmacy contact our office and allow 72 hours for refills to be completed.    Today you received the following chemotherapy and/or immunotherapy agents: keytruda      To help prevent nausea and vomiting after your treatment, we encourage you to take your nausea medication as directed.  BELOW ARE SYMPTOMS THAT SHOULD BE REPORTED IMMEDIATELY: *FEVER GREATER THAN 100.4 F (38 C) OR HIGHER *CHILLS OR SWEATING *NAUSEA AND VOMITING THAT IS NOT CONTROLLED WITH YOUR NAUSEA MEDICATION *UNUSUAL SHORTNESS OF BREATH *UNUSUAL BRUISING OR BLEEDING *URINARY PROBLEMS (pain or burning when urinating, or frequent urination) *BOWEL PROBLEMS (unusual diarrhea, constipation, pain near the anus) TENDERNESS IN MOUTH AND THROAT WITH OR WITHOUT PRESENCE OF ULCERS (sore throat, sores in mouth, or a toothache) UNUSUAL RASH, SWELLING OR PAIN  UNUSUAL VAGINAL DISCHARGE OR ITCHING   Items with * indicate a potential emergency and should be followed up as soon as possible or go to the Emergency Department if any problems should occur.  Please show the CHEMOTHERAPY ALERT CARD or IMMUNOTHERAPY ALERT CARD at check-in to  the Emergency Department and triage nurse.  Should you have questions after your visit or need to cancel or reschedule your appointment, please contact [facility]  Dept: [phone]  and follow the prompts.  Office hours are 8:00 a.m. to 4:30 p.m. Monday - Friday. Please note that voicemails left after 4:00 p.m. may not be returned until the following business day.  We are closed weekends and major holidays. You have access to a nurse at all times for urgent questions. Please call the main number to the clinic Dept: [phone] and follow the prompts.   For any non-urgent questions, you may also contact your provider using MyChart. We now offer e-Visits for anyone 18 and older to request care online for non-urgent symptoms. For details visit [email or web address].   Also download the MyChart app! Go to the app store, search "MyChart", open the app, select [facility], and log in with your MyChart username and password.  Due to Covid, a mask is required upon entering the hospital/clinic. If you do not have a mask, one will be given to you upon arrival. For doctor visits, patients may have 1 support person aged 18 or older with them. For treatment visits, patients cannot have anyone with them due to current Covid guidelines and our immunocompromised population.   

## 2021-01-18 ENCOUNTER — Telehealth: Payer: Self-pay | Admitting: *Deleted

## 2021-01-18 NOTE — Telephone Encounter (Signed)
Demographic sheet & last office visit note faxed to [facility], fax confirmation received.

## 2021-02-08 ENCOUNTER — Other Ambulatory Visit: Payer: Self-pay

## 2021-02-08 ENCOUNTER — Inpatient Hospital Stay: Payer: 59

## 2021-02-08 ENCOUNTER — Inpatient Hospital Stay: Payer: 59 | Attending: Oncology

## 2021-02-08 ENCOUNTER — Inpatient Hospital Stay (HOSPITAL_BASED_OUTPATIENT_CLINIC_OR_DEPARTMENT_OTHER): Payer: 59 | Admitting: Oncology

## 2021-02-08 VITALS — BP 146/72 | HR 77 | Temp 97.7°F | Resp 20 | Wt 256.5 lb

## 2021-02-08 DIAGNOSIS — Z79899 Other long term (current) drug therapy: Secondary | ICD-10-CM | POA: Insufficient documentation

## 2021-02-08 DIAGNOSIS — Z5112 Encounter for antineoplastic immunotherapy: Secondary | ICD-10-CM | POA: Diagnosis not present

## 2021-02-08 DIAGNOSIS — C679 Malignant neoplasm of bladder, unspecified: Secondary | ICD-10-CM | POA: Insufficient documentation

## 2021-02-08 DIAGNOSIS — C670 Malignant neoplasm of trigone of bladder: Secondary | ICD-10-CM | POA: Diagnosis not present

## 2021-02-08 DIAGNOSIS — C772 Secondary and unspecified malignant neoplasm of intra-abdominal lymph nodes: Secondary | ICD-10-CM | POA: Insufficient documentation

## 2021-02-08 LAB — CMP (CANCER CENTER ONLY)
ALT: 19 U/L (ref 0–44)
AST: 19 U/L (ref 15–41)
Albumin: 4.1 g/dL (ref 3.5–5.0)
Alkaline Phosphatase: 55 U/L (ref 38–126)
Anion gap: 11 (ref 5–15)
BUN: 22 mg/dL — ABNORMAL HIGH (ref 6–20)
CO2: 23 mmol/L (ref 22–32)
Calcium: 8.7 mg/dL — ABNORMAL LOW (ref 8.9–10.3)
Chloride: 106 mmol/L (ref 98–111)
Creatinine: 1.37 mg/dL — ABNORMAL HIGH (ref 0.61–1.24)
GFR, Estimated: 59 mL/min — ABNORMAL LOW (ref >60)
Glucose, Bld: 170 mg/dL — ABNORMAL HIGH (ref 70–99)
Potassium: 3.7 mmol/L (ref 3.5–5.1)
Sodium: 140 mmol/L (ref 135–145)
Total Bilirubin: 0.4 mg/dL (ref 0.3–1.2)
Total Protein: 7.3 g/dL (ref 6.5–8.1)

## 2021-02-08 LAB — CBC WITH DIFFERENTIAL (CANCER CENTER ONLY)
Abs Immature Granulocytes: 0.01 10*3/uL (ref 0.00–0.07)
Basophils Absolute: 0.1 10*3/uL (ref 0.0–0.1)
Basophils Relative: 1 %
Eosinophils Absolute: 0.5 10*3/uL (ref 0.0–0.5)
Eosinophils Relative: 8 %
HCT: 41.8 % (ref 39.0–52.0)
Hemoglobin: 14 g/dL (ref 13.0–17.0)
Immature Granulocytes: 0 %
Lymphocytes Relative: 34 %
Lymphs Abs: 2.1 10*3/uL (ref 0.7–4.0)
MCH: 28.2 pg (ref 26.0–34.0)
MCHC: 33.5 g/dL (ref 30.0–36.0)
MCV: 84.1 fL (ref 80.0–100.0)
Monocytes Absolute: 0.6 10*3/uL (ref 0.1–1.0)
Monocytes Relative: 9 %
Neutro Abs: 2.8 10*3/uL (ref 1.7–7.7)
Neutrophils Relative %: 48 %
Platelet Count: 139 10*3/uL — ABNORMAL LOW (ref 150–400)
RBC: 4.97 MIL/uL (ref 4.22–5.81)
RDW: 12.5 % (ref 11.5–15.5)
WBC Count: 6 10*3/uL (ref 4.0–10.5)
nRBC: 0 % (ref 0.0–0.2)

## 2021-02-08 LAB — TSH: TSH: 2.42 u[IU]/mL (ref 0.320–4.118)

## 2021-02-08 MED ORDER — SODIUM CHLORIDE 0.9 % IV SOLN
400.0000 mg | Freq: Once | INTRAVENOUS | Status: AC
  Administered 2021-02-08: 400 mg via INTRAVENOUS
  Filled 2021-02-08: qty 16

## 2021-02-08 MED ORDER — SODIUM CHLORIDE 0.9 % IV SOLN: Freq: Once | INTRAVENOUS | Status: AC

## 2021-02-08 NOTE — Patient Instructions (Signed)
[facility]  Discharge Instructions: ?Thank you for choosing [facility] to provide your oncology and hematology care.  ? ?If you have a lab appointment with the Cancer Center, please go directly to the Cancer Center and check in at the registration area. ?  ?Wear comfortable clothing and clothing appropriate for easy access to any Portacath or PICC line.  ? ?We strive to give you quality time with your provider. You may need to reschedule your appointment if you arrive late (15 or more minutes).  Arriving late affects you and other patients whose appointments are after yours.  Also, if you miss three or more appointments without notifying the office, you may be dismissed from the clinic at the provider?s discretion.    ?  ?For prescription refill requests, have your pharmacy contact our office and allow 72 hours for refills to be completed.   ? ?Today you received the following chemotherapy and/or immunotherapy agents: Keytruda ?  ?To help prevent nausea and vomiting after your treatment, we encourage you to take your nausea medication as directed. ? ?BELOW ARE SYMPTOMS THAT SHOULD BE REPORTED IMMEDIATELY: ?*FEVER GREATER THAN 100.4 F (38 ?C) OR HIGHER ?*CHILLS OR SWEATING ?*NAUSEA AND VOMITING THAT IS NOT CONTROLLED WITH YOUR NAUSEA MEDICATION ?*UNUSUAL SHORTNESS OF BREATH ?*UNUSUAL BRUISING OR BLEEDING ?*URINARY PROBLEMS (pain or burning when urinating, or frequent urination) ?*BOWEL PROBLEMS (unusual diarrhea, constipation, pain near the anus) ?TENDERNESS IN MOUTH AND THROAT WITH OR WITHOUT PRESENCE OF ULCERS (sore throat, sores in mouth, or a toothache) ?UNUSUAL RASH, SWELLING OR PAIN  ?UNUSUAL VAGINAL DISCHARGE OR ITCHING  ? ?Items with * indicate a potential emergency and should be followed up as soon as possible or go to the Emergency Department if any problems should occur. ? ?Please show the CHEMOTHERAPY ALERT CARD or IMMUNOTHERAPY ALERT CARD at check-in to the  Emergency Department and triage nurse. ? ?Should you have questions after your visit or need to cancel or reschedule your appointment, please contact [facility]  Dept: [phone]  and follow the prompts.  Office hours are 8:00 a.m. to 4:30 p.m. Monday - Friday. Please note that voicemails left after 4:00 p.m. may not be returned until the following business day.  We are closed weekends and major holidays. You have access to a nurse at all times for urgent questions. Please call the main number to the clinic Dept: [phone] and follow the prompts. ? ? ?For any non-urgent questions, you may also contact your provider using MyChart. We now offer e-Visits for anyone 18 and older to request care online for non-urgent symptoms. For details visit [email or web address]. ?  ?Also download the MyChart app! Go to the app store, search "MyChart", open the app, select [facility], and log in with your MyChart username and password. ? ?Due to Covid, a mask is required upon entering the hospital/clinic. If you do not have a mask, one will be given to you upon arrival. For doctor visits, patients may have 1 support person aged 18 or older with them. For treatment visits, patients cannot have anyone with them due to current Covid guidelines and our immunocompromised population.  ? ?

## 2021-02-08 NOTE — Progress Notes (Signed)
Hematology and Oncology Follow Up   Edward [name] [id number] [birth year] 60 y.o. 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  He has disease involving pelvic lymph nodes with tumor showed PD-L1 score of 100 at the time of diagnosis.   Prior Therapy:   He is status post cystoscopy, ureteroscopy as well as a left ureteral stent placement on March 21, 2020.  This was performed by Edward [name].  Chemotherapy utilizing gemcitabine and cisplatin started on March 27, 2020.  He completed 3 cycles of therapy.  Current therapy: Pembrolizumab 200 mg every 3 weeks.  Cycle 1 started on 06/08/2020.  He has been receiving 400 mg every 6 weeks starting with cycle 2 of therapy.  He presents today for repeat evaluation of the next cycle of therapy.  Interim History: Edward [name] presents today for a follow-up visit.  Since the last visit, he feels well without any major complaints.  He continues to tolerate Keytruda without any issues.  He denies any nausea, vomiting or abdominal pain.  He denies any pelvic pain or discomfort.  He denies any hospitalizations or illnesses.  His performance status quality of life remain excellent.   Medications: Reviewed without changes. Current Outpatient Medications  Medication Sig Dispense Refill   Cholecalciferol (VITAMIN D-3 PO) Take by mouth.     ibuprofen (ADVIL) 200 MG tablet Take 800 mg by mouth every 6 (six) hours as needed for headache or moderate pain.     Multiple Vitamins-Minerals (ZINC PO) Take by mouth daily.     ondansetron (ZOFRAN) 8 MG tablet Take 1 tablet (8 mg total) by mouth every 8 (eight) hours as needed for nausea or vomiting. 20 tablet 1   phenazopyridine (PYRIDIUM) 200 MG tablet Take 1 tablet (200 mg total) by mouth 3 (three) times daily as needed for pain. 10 tablet 0   prochlorperazine (COMPAZINE)  10 MG tablet Take 1 tablet (10 mg total) by mouth every 6 (six) hours as needed for nausea or vomiting. 30 tablet 0   tamsulosin (FLOMAX) 0.4 MG CAPS capsule Take 1 capsule (0.4 mg total) by mouth daily. 30 capsule 11   traMADol (ULTRAM) 50 MG tablet Take 1-2 tablets (50-100 mg total) by mouth every 6 (six) hours as needed for moderate pain. 15 tablet 0   No current facility-administered medications for this visit.     Allergies: No Known Allergies  Physical exam          ECOG 1     General appearance: Comfortable appearing without any discomfort Head: Normocephalic without any trauma Oropharynx: Mucous membranes are moist and pink without any thrush or ulcers. Eyes: Pupils are equal and round reactive to light. Lymph nodes: No cervical, supraclavicular, inguinal or axillary lymphadenopathy.   Heart:regular rate and rhythm.  S1 and S2 without leg edema. Lung: Clear without any rhonchi or wheezes.  No dullness to percussion. Abdomin: Soft, nontender, nondistended with good bowel sounds.  No hepatosplenomegaly. Musculoskeletal: No joint deformity or effusion.  Full range of motion noted. Neurological: No deficits noted on motor, sensory and deep tendon reflex exam. Skin: No petechial rash or dryness.  Appeared moist.              Lab Results: Lab Results  Component Value Date   WBC 6.8 12/28/2020   HGB 14.5 12/28/2020   HCT 43.0 12/28/2020   MCV  83.8 12/28/2020   PLT 157 12/28/2020     Chemistry      Component Value Date/Time   NA 140 12/28/2020 1257   K 4.0 12/28/2020 1257   CL 105 12/28/2020 1257   CO2 25 12/28/2020 1257   BUN 22 (H) 12/28/2020 1257   CREATININE 1.46 (H) 12/28/2020 1257   CREATININE 1.21 12/26/2017 1007      Component Value Date/Time   CALCIUM 9.9 12/28/2020 1257   ALKPHOS 62 12/28/2020 1257   AST 20 12/28/2020 1257   ALT 21 12/28/2020 1257   BILITOT 0.4 12/28/2020 1257          Impression and Plan:   60 year old man  with:   1.   Bladder cancer diagnosed in November 2021.  He presented with stage IV high-grade urothelial carcinoma at that time.  The natural course of his disease was reviewed at this time and treatment choices were reiterated.  He has tolerated Keytruda without any issues and he is agreeable to continue.  The plan is to update his staging scan in January 2023.   2.  IV access: Port-A-Cath option has been deferred.  No issues reported related to peripheral veins.   3.  Antiemetics: Compazine is available to him without any nausea or vomiting.    4.  Renal function surveillance: His creatinine clearance around 55 cc/min which is slightly dropped and we will continue to monitor.  Renal ultrasound by Edward [name] did not show any abnormality   5.  Goals of care: His disease is potentially incurable but aggressive measures are warranted at this time given his excellent performance status.  He has experienced excellent response which will translate into a long-term disease interval.  6.  Immune mediated complications: He has not experienced any issues including pneumonitis, colitis and thyroid disease.   7.  Follow-up: He will return in 6 weeks for a follow-up evaluation of the next Pembrolizumab infusion.   30  minutes were spent on this encounter.  Time was dedicated to reviewing laboratory data, disease status update and outlining future plan of care.   Edward Button, MD 02/08/2021 8:35 AM

## 2021-03-15 ENCOUNTER — Encounter: Payer: Self-pay | Admitting: Oncology

## 2021-03-15 NOTE — Progress Notes (Signed)
Received fax and follow up call from [employer] regarding copay assistance for [facility] for patient.  Reviewed date of service and balance in question for date of service 12/28/20. While working on copay enrollment paperwork, I contacted patient to provide details on what I was working on for him. Patient states he was contacted by his insurance company advising him that there had been an error made by [facility] and someone was working on getting it corrected and he does not have a copay for [employer]. Confirmed with patient that he would need to enroll for the new year 2023 to assist until new plan year amounts are met and he said yes for the new year. Advised him I will work on this in January for him. He was very Patent attorney.  Staff message sent to [facility] Cycle) for follow up.

## 2021-03-22 ENCOUNTER — Other Ambulatory Visit: Payer: Self-pay

## 2021-03-22 ENCOUNTER — Inpatient Hospital Stay: Payer: 59

## 2021-03-22 ENCOUNTER — Inpatient Hospital Stay: Payer: 59 | Attending: Oncology

## 2021-03-22 ENCOUNTER — Inpatient Hospital Stay (HOSPITAL_BASED_OUTPATIENT_CLINIC_OR_DEPARTMENT_OTHER): Payer: 59 | Admitting: Oncology

## 2021-03-22 VITALS — BP 153/80 | HR 70 | Temp 98.1°F | Resp 18 | Ht 72.0 in | Wt 259.4 lb

## 2021-03-22 DIAGNOSIS — C679 Malignant neoplasm of bladder, unspecified: Secondary | ICD-10-CM | POA: Insufficient documentation

## 2021-03-22 DIAGNOSIS — Z79899 Other long term (current) drug therapy: Secondary | ICD-10-CM | POA: Insufficient documentation

## 2021-03-22 DIAGNOSIS — C772 Secondary and unspecified malignant neoplasm of intra-abdominal lymph nodes: Secondary | ICD-10-CM | POA: Insufficient documentation

## 2021-03-22 DIAGNOSIS — C670 Malignant neoplasm of trigone of bladder: Secondary | ICD-10-CM | POA: Diagnosis not present

## 2021-03-22 DIAGNOSIS — Z5112 Encounter for antineoplastic immunotherapy: Secondary | ICD-10-CM | POA: Insufficient documentation

## 2021-03-22 DIAGNOSIS — Z9221 Personal history of antineoplastic chemotherapy: Secondary | ICD-10-CM | POA: Insufficient documentation

## 2021-03-22 LAB — CMP (CANCER CENTER ONLY)
ALT: 20 U/L (ref 0–44)
AST: 17 U/L (ref 15–41)
Albumin: 4.3 g/dL (ref 3.5–5.0)
Alkaline Phosphatase: 64 U/L (ref 38–126)
Anion gap: 11 (ref 5–15)
BUN: 22 mg/dL — ABNORMAL HIGH (ref 6–20)
CO2: 24 mmol/L (ref 22–32)
Calcium: 8.9 mg/dL (ref 8.9–10.3)
Chloride: 104 mmol/L (ref 98–111)
Creatinine: 1.34 mg/dL — ABNORMAL HIGH (ref 0.61–1.24)
GFR, Estimated: >60 mL/min (ref >60)
Glucose, Bld: 116 mg/dL — ABNORMAL HIGH (ref 70–99)
Potassium: 3.9 mmol/L (ref 3.5–5.1)
Sodium: 139 mmol/L (ref 135–145)
Total Bilirubin: 0.4 mg/dL (ref 0.3–1.2)
Total Protein: 7.8 g/dL (ref 6.5–8.1)

## 2021-03-22 LAB — CBC WITH DIFFERENTIAL (CANCER CENTER ONLY)
Abs Immature Granulocytes: 0.01 10*3/uL (ref 0.00–0.07)
Basophils Absolute: 0.1 10*3/uL (ref 0.0–0.1)
Basophils Relative: 1 %
Eosinophils Absolute: 0.5 10*3/uL (ref 0.0–0.5)
Eosinophils Relative: 8 %
HCT: 42.1 % (ref 39.0–52.0)
Hemoglobin: 14.2 g/dL (ref 13.0–17.0)
Immature Granulocytes: 0 %
Lymphocytes Relative: 35 %
Lymphs Abs: 2.1 10*3/uL (ref 0.7–4.0)
MCH: 28 pg (ref 26.0–34.0)
MCHC: 33.7 g/dL (ref 30.0–36.0)
MCV: 82.9 fL (ref 80.0–100.0)
Monocytes Absolute: 0.6 10*3/uL (ref 0.1–1.0)
Monocytes Relative: 10 %
Neutro Abs: 2.9 10*3/uL (ref 1.7–7.7)
Neutrophils Relative %: 46 %
Platelet Count: 155 10*3/uL (ref 150–400)
RBC: 5.08 MIL/uL (ref 4.22–5.81)
RDW: 12.2 % (ref 11.5–15.5)
WBC Count: 6.2 10*3/uL (ref 4.0–10.5)
nRBC: 0 % (ref 0.0–0.2)

## 2021-03-22 LAB — TSH: TSH: 2.091 u[IU]/mL (ref 0.320–4.118)

## 2021-03-22 MED ORDER — SODIUM CHLORIDE 0.9 % IV SOLN
400.0000 mg | Freq: Once | INTRAVENOUS | Status: AC
  Administered 2021-03-22: 400 mg via INTRAVENOUS
  Filled 2021-03-22: qty 16

## 2021-03-22 MED ORDER — SODIUM CHLORIDE 0.9 % IV SOLN: Freq: Once | INTRAVENOUS | Status: AC

## 2021-03-22 NOTE — Progress Notes (Signed)
Hematology and Oncology Follow Up   Edward [name] [id number] March 02, 1961 60 y.o. 03/22/2021 8:03 AM Edward [name], MDGreene, Edward Patrick, MD      Principle Diagnosis: 60 year old man with bladder cancer diagnosed in December 2021.  He developed stage IV high-grade urothelial carcinoma with pelvic adenopathy, PD-L1 score of 100.   Prior Therapy:   He is status post cystoscopy, ureteroscopy as well as a left ureteral stent placement on March 21, 2020.  This was performed by Dr. Louis Meckel.  Chemotherapy utilizing gemcitabine and cisplatin started on March 27, 2020.  He completed 3 cycles of therapy.  Current therapy: Pembrolizumab 200 mg every 3 weeks.  Cycle 1 started on 06/08/2020.  He has been receiving 400 mg every 6 weeks starting with cycle 2 of therapy.  He returns for the subsequent cycle of treatment.  Interim History: [name] returns today for repeat evaluation.  Since the last visit, he reports no major changes in his health.  He continues to tolerate Pembrolizumab without any complaints.  He denies any nausea, vomiting or abdominal pain.  He denies any weight loss or appetite changes.  Formal status quality of life remained excellent.   Medications: Updated on review. Current Outpatient Medications  Medication Sig Dispense Refill   Cholecalciferol (VITAMIN D-3 PO) Take by mouth.     ibuprofen (ADVIL) 200 MG tablet Take 800 mg by mouth every 6 (six) hours as needed for headache or moderate pain.     Multiple Vitamins-Minerals (ZINC PO) Take by mouth daily.     ondansetron (ZOFRAN) 8 MG tablet Take 1 tablet (8 mg total) by mouth every 8 (eight) hours as needed for nausea or vomiting. 20 tablet 1   phenazopyridine (PYRIDIUM) 200 MG tablet Take 1 tablet (200 mg total) by mouth 3 (three) times daily as needed for pain. 10 tablet 0   prochlorperazine (COMPAZINE) 10 MG tablet Take 1 tablet (10 mg total) by mouth every 6 (six) hours as needed for nausea or vomiting. 30 tablet 0    tamsulosin (FLOMAX) 0.4 MG CAPS capsule Take 1 capsule (0.4 mg total) by mouth daily. 30 capsule 11   traMADol (ULTRAM) 50 MG tablet Take 1-2 tablets (50-100 mg total) by mouth every 6 (six) hours as needed for moderate pain. 15 tablet 0   No current facility-administered medications for this visit.     Allergies: No Known Allergies  Physical exam     Blood pressure (!) 153/80, pulse 70, temperature 98.1 F (36.7 C), temperature source Temporal, resp. rate 18, height 6' (1.829 m), weight 259 lb 6.4 oz (117.7 kg), SpO2 99 %.      ECOG 1    General appearance: Alert, awake without any distress. Head: Atraumatic without abnormalities Oropharynx: Without any thrush or ulcers. Eyes: No scleral icterus. Lymph nodes: No lymphadenopathy noted in the cervical, supraclavicular, or axillary nodes Heart:regular rate and rhythm, without any murmurs or gallops.   Lung: Clear to auscultation without any rhonchi, wheezes or dullness to percussion. Abdomin: Soft, nontender without any shifting dullness or ascites. Musculoskeletal: No clubbing or cyanosis. Neurological: No motor or sensory deficits. Skin: No rashes or lesions.             Lab Results: Lab Results  Component Value Date   WBC 6.0 02/08/2021   HGB 14.0 02/08/2021   HCT 41.8 02/08/2021   MCV 84.1 02/08/2021   PLT 139 (L) 02/08/2021     Chemistry      Component Value Date/Time  NA 140 02/08/2021 0841   K 3.7 02/08/2021 0841   CL 106 02/08/2021 0841   CO2 23 02/08/2021 0841   BUN 22 (H) 02/08/2021 0841   CREATININE 1.37 (H) 02/08/2021 0841   CREATININE 1.21 12/26/2017 1007      Component Value Date/Time   CALCIUM 8.7 (L) 02/08/2021 0841   ALKPHOS 55 02/08/2021 0841   AST 19 02/08/2021 0841   ALT 19 02/08/2021 0841   BILITOT 0.4 02/08/2021 0841          Impression and Plan:   60 year old man with:   1.  Stage IV high-grade urothelial carcinoma of the bladder with pelvic  adenopathy.   He continues to exhibit excellent response to therapy and achieved a near complete response to chemotherapy followed by switch maintenance with Pembrolizumab.  Risks and benefits of continuing this treatment were discussed at this time.  Plan is to update his staging scans before the next visit.  If his salvage therapy options including antibiotic drug conjugate among other agents were reviewed.  I recommend continuing Pembrolizumab for the time being to complete a year of therapy.  He is agreeable to continue.   2.  IV access: Peripheral veins are currently in use without any issues.   3.  Antiemetics: No nausea or vomiting reported at this time.  Compazine is available to him.    4.  Renal function surveillance: Creatinine clearance is around 59 cc/min and will continue to monitor at future visits.   5.  Goals of care: Aggressive measures are warranted given his excellent response to therapy.  6.  Immune mediated complications: I continue to educate him about potential complication clinic pneumonitis, colitis and thyroid disease.  He is not experiencing any at this time.   7.  Follow-up: In 6 weeks for repeat follow-up   30  minutes were dedicated to this visit.  The time was spent on reviewing laboratory data, disease status update and outlining future plan of care discussion.   Zola Button, MD 03/22/2021 8:03 AM

## 2021-03-22 NOTE — Patient Instructions (Signed)
[facility]  Discharge Instructions: °Thank you for choosing [facility] to provide your oncology and hematology care.  ° °If you have a lab appointment with the Cancer Center, please go directly to the Cancer Center and check in at the registration area. °  °Wear comfortable clothing and clothing appropriate for easy access to any Portacath or PICC line.  ° °We strive to give you quality time with your provider. You may need to reschedule your appointment if you arrive late (15 or more minutes).  Arriving late affects you and other patients whose appointments are after yours.  Also, if you miss three or more appointments without notifying the office, you may be dismissed from the clinic at the provider’s discretion.    °  °For prescription refill requests, have your pharmacy contact our office and allow 72 hours for refills to be completed.   ° °Today you received the following chemotherapy and/or immunotherapy agent: Pembrolizumab (Keytruda) °  °To help prevent nausea and vomiting after your treatment, we encourage you to take your nausea medication as directed. ° °BELOW ARE SYMPTOMS THAT SHOULD BE REPORTED IMMEDIATELY: °*FEVER GREATER THAN 100.4 F (38 °C) OR HIGHER °*CHILLS OR SWEATING °*NAUSEA AND VOMITING THAT IS NOT CONTROLLED WITH YOUR NAUSEA MEDICATION °*UNUSUAL SHORTNESS OF BREATH °*UNUSUAL BRUISING OR BLEEDING °*URINARY PROBLEMS (pain or burning when urinating, or frequent urination) °*BOWEL PROBLEMS (unusual diarrhea, constipation, pain near the anus) °TENDERNESS IN MOUTH AND THROAT WITH OR WITHOUT PRESENCE OF ULCERS (sore throat, sores in mouth, or a toothache) °UNUSUAL RASH, SWELLING OR PAIN  °UNUSUAL VAGINAL DISCHARGE OR ITCHING  ° °Items with * indicate a potential emergency and should be followed up as soon as possible or go to the Emergency Department if any problems should occur. ° °Please show the CHEMOTHERAPY ALERT CARD or IMMUNOTHERAPY ALERT CARD at  check-in to the Emergency Department and triage nurse. ° °Should you have questions after your visit or need to cancel or reschedule your appointment, please contact [facility]  Dept: [phone]  and follow the prompts.  Office hours are 8:00 a.m. to 4:30 p.m. Monday - Friday. Please note that voicemails left after 4:00 p.m. may not be returned until the following business day.  We are closed weekends and major holidays. You have access to a nurse at all times for urgent questions. Please call the main number to the clinic Dept: [phone] and follow the prompts. ° ° °For any non-urgent questions, you may also contact your provider using MyChart. We now offer e-Visits for anyone 18 and older to request care online for non-urgent symptoms. For details visit [email or web address]. °  °Also download the MyChart app! Go to the app store, search "MyChart", open the app, select [facility], and log in with your MyChart username and password. ° °Due to Covid, a mask is required upon entering the hospital/clinic. If you do not have a mask, one will be given to you upon arrival. For doctor visits, patients may have 1 support person aged 18 or older with them. For treatment visits, patients cannot have anyone with them due to current Covid guidelines and our immunocompromised population.  ° °

## 2021-03-24 ENCOUNTER — Other Ambulatory Visit: Payer: Self-pay

## 2021-04-10 ENCOUNTER — Encounter: Payer: Self-pay | Admitting: Oncology

## 2021-04-10 NOTE — Progress Notes (Signed)
Working on 2023 copay assistance enrollment for [place] for patient through [employer].  Called patient to advise I would put the application in the mail to him to complete his portions and return to me.  Left physician form in provider mailbox to be completed and returned to me with application. Asked patient if he also has updated insurance card that would be needed as well. Placing my card in envelope as well for any additional financial questions or concerns and to return application to.He verbalized understanding.  Placed application in outgoing mail.

## 2021-04-12 ENCOUNTER — Encounter: Payer: Self-pay | Admitting: Oncology

## 2021-04-12 NOTE — Progress Notes (Signed)
Received physician portion of [employer] application for [employer]. Awaiting patient portion to be returned by patient, then will send to [employer] for processing.

## 2021-05-01 ENCOUNTER — Encounter (HOSPITAL_COMMUNITY): Payer: Self-pay

## 2021-05-01 ENCOUNTER — Encounter: Payer: Self-pay | Admitting: Oncology

## 2021-05-01 ENCOUNTER — Other Ambulatory Visit: Payer: Self-pay

## 2021-05-01 ENCOUNTER — Ambulatory Visit (HOSPITAL_COMMUNITY)
Admission: RE | Admit: 2021-05-01 | Discharge: 2021-05-01 | Disposition: A | Discharge status: Home / Self Care | Payer: 59 | Source: Ambulatory Visit | Attending: Oncology | Admitting: Oncology

## 2021-05-01 DIAGNOSIS — C670 Malignant neoplasm of trigone of bladder: Secondary | ICD-10-CM | POA: Insufficient documentation

## 2021-05-01 LAB — POCT I-STAT CREATININE: Creatinine, Ser: 1.5 mg/dL — ABNORMAL HIGH (ref 0.61–1.24)

## 2021-05-01 MED ORDER — IOHEXOL 300 MG/ML  SOLN
100.0000 mL | Freq: Once | INTRAMUSCULAR | Status: AC | PRN
  Administered 2021-05-01: 09:00:00 100 mL via INTRAVENOUS

## 2021-05-01 MED ORDER — SODIUM CHLORIDE 0.9 % IV SOLN
INTRAVENOUS | Status: AC
  Filled 2021-05-01: qty 250

## 2021-05-01 MED ORDER — SODIUM CHLORIDE (PF) 0.9 % IJ SOLN
INTRAMUSCULAR | Status: AC
  Filled 2021-05-01: qty 50

## 2021-05-01 NOTE — Progress Notes (Signed)
Both electronic faxes were received successfully to [employer] and [employer].

## 2021-05-01 NOTE — Progress Notes (Signed)
Received completed patient portion of [employer] application for [place] today via mail.  Electronically faxed completed application to [employer].  Electronically faxed correspondence to [employer] regarding pending status of enrollment.

## 2021-05-03 ENCOUNTER — Other Ambulatory Visit: Payer: Self-pay

## 2021-05-03 ENCOUNTER — Inpatient Hospital Stay: Payer: 59 | Attending: Oncology

## 2021-05-03 ENCOUNTER — Inpatient Hospital Stay: Payer: 59

## 2021-05-03 ENCOUNTER — Inpatient Hospital Stay (HOSPITAL_BASED_OUTPATIENT_CLINIC_OR_DEPARTMENT_OTHER): Payer: 59 | Admitting: Oncology

## 2021-05-03 VITALS — BP 138/84 | HR 65 | Temp 97.7°F | Resp 17 | Wt 261.9 lb

## 2021-05-03 DIAGNOSIS — Z5112 Encounter for antineoplastic immunotherapy: Secondary | ICD-10-CM | POA: Diagnosis not present

## 2021-05-03 DIAGNOSIS — Z9221 Personal history of antineoplastic chemotherapy: Secondary | ICD-10-CM | POA: Diagnosis not present

## 2021-05-03 DIAGNOSIS — Z79899 Other long term (current) drug therapy: Secondary | ICD-10-CM | POA: Insufficient documentation

## 2021-05-03 DIAGNOSIS — C772 Secondary and unspecified malignant neoplasm of intra-abdominal lymph nodes: Secondary | ICD-10-CM | POA: Diagnosis present

## 2021-05-03 DIAGNOSIS — C670 Malignant neoplasm of trigone of bladder: Secondary | ICD-10-CM | POA: Diagnosis not present

## 2021-05-03 DIAGNOSIS — C679 Malignant neoplasm of bladder, unspecified: Secondary | ICD-10-CM | POA: Insufficient documentation

## 2021-05-03 LAB — CBC WITH DIFFERENTIAL (CANCER CENTER ONLY)
Abs Immature Granulocytes: 0.01 10*3/uL (ref 0.00–0.07)
Basophils Absolute: 0.1 10*3/uL (ref 0.0–0.1)
Basophils Relative: 1 %
Eosinophils Absolute: 0.4 10*3/uL (ref 0.0–0.5)
Eosinophils Relative: 8 %
HCT: 42.7 % (ref 39.0–52.0)
Hemoglobin: 14.4 g/dL (ref 13.0–17.0)
Immature Granulocytes: 0 %
Lymphocytes Relative: 37 %
Lymphs Abs: 2 10*3/uL (ref 0.7–4.0)
MCH: 27.9 pg (ref 26.0–34.0)
MCHC: 33.7 g/dL (ref 30.0–36.0)
MCV: 82.6 fL (ref 80.0–100.0)
Monocytes Absolute: 0.6 10*3/uL (ref 0.1–1.0)
Monocytes Relative: 11 %
Neutro Abs: 2.4 10*3/uL (ref 1.7–7.7)
Neutrophils Relative %: 43 %
Platelet Count: 134 10*3/uL — ABNORMAL LOW (ref 150–400)
RBC: 5.17 MIL/uL (ref 4.22–5.81)
RDW: 12.3 % (ref 11.5–15.5)
WBC Count: 5.4 10*3/uL (ref 4.0–10.5)
nRBC: 0 % (ref 0.0–0.2)

## 2021-05-03 LAB — CMP (CANCER CENTER ONLY)
ALT: 28 U/L (ref 0–44)
AST: 21 U/L (ref 15–41)
Albumin: 4.5 g/dL (ref 3.5–5.0)
Alkaline Phosphatase: 55 U/L (ref 38–126)
Anion gap: 9 (ref 5–15)
BUN: 23 mg/dL — ABNORMAL HIGH (ref 6–20)
CO2: 28 mmol/L (ref 22–32)
Calcium: 9.5 mg/dL (ref 8.9–10.3)
Chloride: 102 mmol/L (ref 98–111)
Creatinine: 1.33 mg/dL — ABNORMAL HIGH (ref 0.61–1.24)
GFR, Estimated: >60 mL/min (ref >60)
Glucose, Bld: 115 mg/dL — ABNORMAL HIGH (ref 70–99)
Potassium: 4 mmol/L (ref 3.5–5.1)
Sodium: 139 mmol/L (ref 135–145)
Total Bilirubin: 0.4 mg/dL (ref 0.3–1.2)
Total Protein: 7.8 g/dL (ref 6.5–8.1)

## 2021-05-03 LAB — TSH: TSH: 2.044 u[IU]/mL (ref 0.320–4.118)

## 2021-05-03 MED ORDER — SODIUM CHLORIDE 0.9 % IV SOLN
400.0000 mg | Freq: Once | INTRAVENOUS | Status: AC
  Administered 2021-05-03: 400 mg via INTRAVENOUS
  Filled 2021-05-03: qty 16

## 2021-05-03 MED ORDER — SODIUM CHLORIDE 0.9 % IV SOLN: Freq: Once | INTRAVENOUS | Status: AC

## 2021-05-03 NOTE — Progress Notes (Signed)
Hematology and Oncology Follow Up   Edward [name] [id number] 25-Jul-1960 62 y.o. 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   Prior Therapy:   He is status post cystoscopy, ureteroscopy as well as a left ureteral stent placement on March 21, 2020.  This was performed by Dr. Louis Meckel.  Chemotherapy utilizing gemcitabine and cisplatin started on March 27, 2020.  He completed 3 cycles of therapy.  Current therapy: Pembrolizumab 200 mg every 3 weeks.  Cycle 1 started on 06/08/2020.  He has been receiving 400 mg every 6 weeks starting with cycle 2 of therapy.  He is here for the next cycle of therapy.  Interim History: [name] presents today for return evaluation.  Since her last visit, he reports no major changes in his health.  He continues to tolerate immunotherapy without any complaints.  He denies any nausea, vomiting or abdominal pain.  He denies any hematochezia or melena.  He denies recent hospitalizations or illnesses.  Medications: Reviewed without changes. Current Outpatient Medications  Medication Sig Dispense Refill   Cholecalciferol (VITAMIN D-3 PO) Take by mouth.     ibuprofen (ADVIL) 200 MG tablet Take 800 mg by mouth every 6 (six) hours as needed for headache or moderate pain.     Multiple Vitamins-Minerals (ZINC PO) Take by mouth daily.     ondansetron (ZOFRAN) 8 MG tablet Take 1 tablet (8 mg total) by mouth every 8 (eight) hours as needed for nausea or vomiting. 20 tablet 1   phenazopyridine (PYRIDIUM) 200 MG tablet Take 1 tablet (200 mg total) by mouth 3 (three) times daily as needed for pain. 10 tablet 0   prochlorperazine (COMPAZINE) 10 MG tablet Take 1 tablet (10 mg total) by mouth every 6 (six) hours as needed for nausea or vomiting. 30 tablet 0   tamsulosin (FLOMAX) 0.4 MG CAPS  capsule Take 1 capsule (0.4 mg total) by mouth daily. 30 capsule 11   traMADol (ULTRAM) 50 MG tablet Take 1-2 tablets (50-100 mg total) by mouth every 6 (six) hours as needed for moderate pain. 15 tablet 0   No current facility-administered medications for this visit.     Allergies: No Known Allergies  Physical exam      Blood pressure 138/84, pulse 65, temperature 97.7 F (36.5 C), temperature source Temporal, resp. rate 17, weight 261 lb 14.4 oz (118.8 kg), SpO2 99 %.      ECOG 1     General appearance: Comfortable appearing without any discomfort Head: Normocephalic without any trauma Oropharynx: Mucous membranes are moist and pink without any thrush or ulcers. Eyes: Pupils are equal and round reactive to light. Lymph nodes: No cervical, supraclavicular, inguinal or axillary lymphadenopathy.   Heart:regular rate and rhythm.  S1 and S2 without leg edema. Lung: Clear without any rhonchi or wheezes.  No dullness to percussion. Abdomin: Soft, nontender, nondistended with good bowel sounds.  No hepatosplenomegaly. Musculoskeletal: No joint deformity or effusion.  Full range of motion noted. Neurological: No deficits noted on motor, sensory and deep tendon reflex exam. Skin: No petechial rash or dryness.  Appeared moist.                Lab Results: Lab Results  Component Value Date   WBC 6.2 03/22/2021   HGB 14.2 03/22/2021   HCT 42.1 03/22/2021  MCV 82.9 03/22/2021   PLT 155 03/22/2021     Chemistry      Component Value Date/Time   NA 139 03/22/2021 0811   K 3.9 03/22/2021 0811   CL 104 03/22/2021 0811   CO2 24 03/22/2021 0811   BUN 22 (H) 03/22/2021 0811   CREATININE 1.50 (H) 05/01/2021 0825   CREATININE 1.34 (H) 03/22/2021 0811   CREATININE 1.21 12/26/2017 1007      Component Value Date/Time   CALCIUM 8.9 03/22/2021 0811   ALKPHOS 64 03/22/2021 0811   AST 17 03/22/2021 0811   ALT 20 03/22/2021 0811   BILITOT 0.4 03/22/2021 0811         IMPRESSION: 1. No evidence of recurrent bladder cancer or metachronous urothelial carcinoma. 2. Similar to minimal decrease in upper normal right obturator node. No new or progressive adenopathy. 3. Similar mild left-sided renal collecting system prominence, likely related to remote stenting. 4. Similar nonspecific edema about the base of the penis. 5.  No acute process or evidence of metastatic disease in the chest. 6. Aortic valvular calcifications. Consider echocardiography to evaluate for valvular dysfunction. 7.  Aortic Atherosclerosis (ICD10-I70.0).    Impression and Plan:   61 year old man with:   1.  Bladder cancer diagnosed in 2021.  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  He continues to tolerate immunotherapy without any major complications.  Risks and benefits of continuing this treatment were reviewed at this time.  Complication occluding immune mediated issues and GI toxicity among others were reiterated.  CT scan obtained on January 25 showed excellent response to therapy without any residual disease.  At this time he is agreeable to continue and the plan is to continue with immunotherapy for the next 12 months.  He is agreeable to proceed.   2.  IV access: No problems related to access at this time noted.   3.  Antiemetics: Compazine is available to him without any nausea or vomiting.    4.  Renal function surveillance: Kidney function remained stable with a creatinine clearance between 55 to 60 cc/min.   5.  Goals of care: Therapy is likely palliative but aggressive measures are warranted given his excellent response.  6.  Immune mediated complications: He has not experienced any complications at this time.  I continue to educate him about pneumonitis, colitis and thyroid disease.   7.  Follow-up: He will return in 6 weeks for a follow-up evaluation.   30  minutes were spent on this encounter.  The time was  dedicated to reviewing laboratory data, disease status update and outlining future plan of care discussion.   Zola Button, MD 05/03/2021 7:47 AM

## 2021-05-03 NOTE — Patient Instructions (Signed)
[facility]  ° Discharge Instructions: °Thank you for choosing [facility] to provide your oncology and hematology care.  ° °If you have a lab appointment with the Cancer Center, please go directly to the Cancer Center and check in at the registration area. °  °Wear comfortable clothing and clothing appropriate for easy access to any Portacath or PICC line.  ° °We strive to give you quality time with your provider. You may need to reschedule your appointment if you arrive late (15 or more minutes).  Arriving late affects you and other patients whose appointments are after yours.  Also, if you miss three or more appointments without notifying the office, you may be dismissed from the clinic at the provider’s discretion.    °  °For prescription refill requests, have your pharmacy contact our office and allow 72 hours for refills to be completed.   ° °Today you received the following chemotherapy and/or immunotherapy agents: Pembrolizumab (Keytruda) °  °To help prevent nausea and vomiting after your treatment, we encourage you to take your nausea medication as directed. ° °BELOW ARE SYMPTOMS THAT SHOULD BE REPORTED IMMEDIATELY: °*FEVER GREATER THAN 100.4 F (38 °C) OR HIGHER °*CHILLS OR SWEATING °*NAUSEA AND VOMITING THAT IS NOT CONTROLLED WITH YOUR NAUSEA MEDICATION °*UNUSUAL SHORTNESS OF BREATH °*UNUSUAL BRUISING OR BLEEDING °*URINARY PROBLEMS (pain or burning when urinating, or frequent urination) °*BOWEL PROBLEMS (unusual diarrhea, constipation, pain near the anus) °TENDERNESS IN MOUTH AND THROAT WITH OR WITHOUT PRESENCE OF ULCERS (sore throat, sores in mouth, or a toothache) °UNUSUAL RASH, SWELLING OR PAIN  °UNUSUAL VAGINAL DISCHARGE OR ITCHING  ° °Items with * indicate a potential emergency and should be followed up as soon as possible or go to the Emergency Department if any problems should occur. ° °Please show the CHEMOTHERAPY ALERT CARD or IMMUNOTHERAPY ALERT CARD  at check-in to the Emergency Department and triage nurse. ° °Should you have questions after your visit or need to cancel or reschedule your appointment, please contact [facility]  Dept: [phone]  and follow the prompts.  Office hours are 8:00 a.m. to 4:30 p.m. Monday - Friday. Please note that voicemails left after 4:00 p.m. may not be returned until the following business day.  We are closed weekends and major holidays. You have access to a nurse at all times for urgent questions. Please call the main number to the clinic Dept: [phone] and follow the prompts. ° ° °For any non-urgent questions, you may also contact your provider using MyChart. We now offer e-Visits for anyone 18 and older to request care online for non-urgent symptoms. For details visit [email or web address]. °  °Also download the MyChart app! Go to the app store, search "MyChart", open the app, select [facility], and log in with your MyChart username and password. ° °Due to Covid, a mask is required upon entering the hospital/clinic. If you do not have a mask, one will be given to you upon arrival. For doctor visits, patients may have 1 support person aged 18 or older with them. For treatment visits, patients cannot have anyone with them due to current Covid guidelines and our immunocompromised population.  ° °

## 2021-05-23 ENCOUNTER — Encounter: Payer: Self-pay | Admitting: Oncology

## 2021-05-23 NOTE — Progress Notes (Signed)
Received call from [facility] w/[employer] regarding copay assistance approval.  Provided information which she states would not require fax back.

## 2021-05-23 NOTE — Progress Notes (Signed)
Received determination letter from [employer] for [employer].  Patient approved 04/07/21 - 04/06/22 for $25,000 leaving him with a $25 copay for the first one after insurance pays their portion.  A copy of the approval letter will be sent to the patient for his records only.  A copy given to [facility] for billing/claim submissions.  Information will be updated in [facility].

## 2021-06-14 ENCOUNTER — Other Ambulatory Visit: Payer: Self-pay

## 2021-06-14 ENCOUNTER — Inpatient Hospital Stay: Payer: 59 | Attending: Oncology

## 2021-06-14 ENCOUNTER — Inpatient Hospital Stay: Payer: 59

## 2021-06-14 ENCOUNTER — Inpatient Hospital Stay (HOSPITAL_BASED_OUTPATIENT_CLINIC_OR_DEPARTMENT_OTHER): Payer: 59 | Admitting: Oncology

## 2021-06-14 VITALS — BP 146/82 | HR 69 | Temp 98.1°F | Resp 19 | Ht 72.0 in | Wt 261.1 lb

## 2021-06-14 DIAGNOSIS — C670 Malignant neoplasm of trigone of bladder: Secondary | ICD-10-CM

## 2021-06-14 DIAGNOSIS — Z5112 Encounter for antineoplastic immunotherapy: Secondary | ICD-10-CM | POA: Diagnosis present

## 2021-06-14 DIAGNOSIS — Z79899 Other long term (current) drug therapy: Secondary | ICD-10-CM | POA: Insufficient documentation

## 2021-06-14 DIAGNOSIS — C772 Secondary and unspecified malignant neoplasm of intra-abdominal lymph nodes: Secondary | ICD-10-CM | POA: Insufficient documentation

## 2021-06-14 DIAGNOSIS — Z9221 Personal history of antineoplastic chemotherapy: Secondary | ICD-10-CM | POA: Diagnosis not present

## 2021-06-14 DIAGNOSIS — C679 Malignant neoplasm of bladder, unspecified: Secondary | ICD-10-CM | POA: Insufficient documentation

## 2021-06-14 LAB — CMP (CANCER CENTER ONLY)
ALT: 22 U/L (ref 0–44)
AST: 19 U/L (ref 15–41)
Albumin: 4.4 g/dL (ref 3.5–5.0)
Alkaline Phosphatase: 56 U/L (ref 38–126)
Anion gap: 8 (ref 5–15)
BUN: 23 mg/dL — ABNORMAL HIGH (ref 6–20)
CO2: 28 mmol/L (ref 22–32)
Calcium: 9.5 mg/dL (ref 8.9–10.3)
Chloride: 104 mmol/L (ref 98–111)
Creatinine: 1.25 mg/dL — ABNORMAL HIGH (ref 0.61–1.24)
GFR, Estimated: >60 mL/min (ref >60)
Glucose, Bld: 114 mg/dL — ABNORMAL HIGH (ref 70–99)
Potassium: 3.9 mmol/L (ref 3.5–5.1)
Sodium: 140 mmol/L (ref 135–145)
Total Bilirubin: 0.4 mg/dL (ref 0.3–1.2)
Total Protein: 7.3 g/dL (ref 6.5–8.1)

## 2021-06-14 LAB — CBC WITH DIFFERENTIAL (CANCER CENTER ONLY)
Abs Immature Granulocytes: 0.01 10*3/uL (ref 0.00–0.07)
Basophils Absolute: 0.1 10*3/uL (ref 0.0–0.1)
Basophils Relative: 1 %
Eosinophils Absolute: 0.3 10*3/uL (ref 0.0–0.5)
Eosinophils Relative: 5 %
HCT: 41.5 % (ref 39.0–52.0)
Hemoglobin: 14.3 g/dL (ref 13.0–17.0)
Immature Granulocytes: 0 %
Lymphocytes Relative: 37 %
Lymphs Abs: 2.2 10*3/uL (ref 0.7–4.0)
MCH: 28.8 pg (ref 26.0–34.0)
MCHC: 34.5 g/dL (ref 30.0–36.0)
MCV: 83.7 fL (ref 80.0–100.0)
Monocytes Absolute: 0.6 10*3/uL (ref 0.1–1.0)
Monocytes Relative: 10 %
Neutro Abs: 2.8 10*3/uL (ref 1.7–7.7)
Neutrophils Relative %: 47 %
Platelet Count: 137 10*3/uL — ABNORMAL LOW (ref 150–400)
RBC: 4.96 MIL/uL (ref 4.22–5.81)
RDW: 12.6 % (ref 11.5–15.5)
WBC Count: 5.9 10*3/uL (ref 4.0–10.5)
nRBC: 0 % (ref 0.0–0.2)

## 2021-06-14 LAB — TSH: TSH: 2.025 u[IU]/mL (ref 0.320–4.118)

## 2021-06-14 MED ORDER — SODIUM CHLORIDE 0.9 % IV SOLN
400.0000 mg | Freq: Once | INTRAVENOUS | Status: AC
  Administered 2021-06-14: 400 mg via INTRAVENOUS
  Filled 2021-06-14: qty 16

## 2021-06-14 MED ORDER — SODIUM CHLORIDE 0.9 % IV SOLN: Freq: Once | INTRAVENOUS | Status: AC

## 2021-06-14 NOTE — Progress Notes (Signed)
Hematology and Oncology Follow Up  ? ?Edward [name] ?[id number] ?06/17/1960 61 y.o. ?06/14/2021 8:06 AM ?Edward [name], MDGreene, Edward Patrick, MD  ? ? ? ? ?Principle Diagnosis: 61 year old man with bladder cancer diagnosed in December 2021.  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  ? ? ?Prior Therapy:  ? ?He is status post cystoscopy, ureteroscopy as well as a left ureteral stent placement on March 21, 2020.  This was performed by Dr. Louis Meckel. ? ?Chemotherapy utilizing gemcitabine and cisplatin started on March 27, 2020.  He completed 3 cycles of therapy. ? ?Current therapy: Pembrolizumab 200 mg every 3 weeks.  Cycle 1 started on 06/08/2020.  He has been receiving 400 mg every 6 weeks starting with cycle 2 of therapy.  He returns today for the subsequent cycle of therapy. ? 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  Since the last visit, he reports no major changes in his health.  He denies any recent hospitalizations or illnesses.  He denies any nausea, vomiting or abdominal pain.  He denies any urinary complaints.  His performance status quality of life remain excellent.  Denies any skin rashes or lesions.  He denies any respiratory issues.  He is eating well and gained more weight.  He is try to exercise and lose weight intentionally.  He is only taking Flomax daily to every other day with excellent management of his urinary symptoms. ? ?Medications: Updated on review. ?Current Outpatient Medications  ?Medication Sig Dispense Refill  ? Cholecalciferol (VITAMIN D-3 PO) Take by mouth.    ? ibuprofen (ADVIL) 200 MG tablet Take 800 mg by mouth every 6 (six) hours as needed for headache or moderate pain.    ? Multiple Vitamins-Minerals (ZINC PO) Take by mouth daily.    ? ondansetron (ZOFRAN) 8 MG tablet Take 1 tablet (8 mg total) by mouth every 8 (eight) hours as needed for nausea or vomiting. 20 tablet 1  ? phenazopyridine  (PYRIDIUM) 200 MG tablet Take 1 tablet (200 mg total) by mouth 3 (three) times daily as needed for pain. 10 tablet 0  ? prochlorperazine (COMPAZINE) 10 MG tablet Take 1 tablet (10 mg total) by mouth every 6 (six) hours as needed for nausea or vomiting. 30 tablet 0  ? tamsulosin (FLOMAX) 0.4 MG CAPS capsule Take 1 capsule (0.4 mg total) by mouth daily. 30 capsule 11  ? traMADol (ULTRAM) 50 MG tablet Take 1-2 tablets (50-100 mg total) by mouth every 6 (six) hours as needed for moderate pain. 15 tablet 0  ? ?No current facility-administered medications for this visit.  ? ? ? ?Allergies: No Known Allergies ? ?Physical exam ? ? ? ? ? ?Blood pressure (!) 146/82, pulse 69, temperature 98.1 ?F (36.7 ?C), temperature source Tympanic, resp. rate 19, height 6' (1.829 m), weight 261 lb 1.6 oz (118.4 kg), SpO2 98 %. ? ? ? ? ? ? ?ECOG 1 ?  ? ?General appearance: Alert, awake without any distress. ?Head: Atraumatic without abnormalities ?Oropharynx: Without any thrush or ulcers. ?Eyes: No scleral icterus. ?Lymph nodes: No lymphadenopathy noted in the cervical, supraclavicular, or axillary nodes ?Heart:regular rate and rhythm, without any murmurs or gallops.   ?Lung: Clear to auscultation without any rhonchi, wheezes or dullness to percussion. ?Abdomin: Soft, nontender without any shifting dullness or ascites. ?Musculoskeletal: No clubbing or cyanosis. ?Neurological: No motor or sensory deficits. ?Skin: No rashes or lesions. ? ? ? ? ? ? ? ? ? ? ? ? ? ? ?  Lab Results: ?Lab Results  ?Component Value Date  ? WBC 5.4 05/03/2021  ? HGB 14.4 05/03/2021  ? HCT 42.7 05/03/2021  ? MCV 82.6 05/03/2021  ? PLT 134 (L) 05/03/2021  ? ?  Chemistry   ?   ?Component Value Date/Time  ? NA 139 05/03/2021 0816  ? K 4.0 05/03/2021 0816  ? CL 102 05/03/2021 0816  ? CO2 28 05/03/2021 0816  ? BUN 23 (H) 05/03/2021 0816  ? CREATININE 1.33 (H) 05/03/2021 0816  ? CREATININE 1.21 12/26/2017 1007  ?    ?Component Value Date/Time  ? CALCIUM 9.5 05/03/2021 0816   ? ALKPHOS 55 05/03/2021 0816  ? AST 21 05/03/2021 0816  ? ALT 28 05/03/2021 0816  ? BILITOT 0.4 05/03/2021 0816  ?  ? ? ?  ? ?  ? ?Impression and Plan: ? ? ?61 year old man with: ?  ?1.  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  He has a complete response to therapy at this time. ? ?He continues to tolerate Pembrolizumab without any major complications.  Risks and benefits of continuing this treatment long-term were discussed.  Complications that include immune mediated issues, GI toxicity and dermatological toxicities were reiterated.  He is not experiencing any at this time.  Plan is to update his staging scans in the next 3 months.  Different salvage therapy options including systemic chemotherapy, antibiotic drug conjugate among others were reviewed.  He is agreeable to proceed. ? ? ?2.  IV access: Peripheral veins are currently in use without any issues. ?  ?3.  Antiemetics: No nausea or vomiting reported at this time.  Compazine is available to him. ?   ?4.  Renal function surveillance: Creatinine clearance continues to be close to normal range without any decline. ?  ?5.  Goals of care: Aggressive measures are warranted given his excellent response and performance status. ? ?6.  Immune mediated complications: I continue to educate him about issues including pneumonitis, colitis, thyroid disease and dermatitis among others.  He has not had any complications. ?  ?7.  Follow-up: In 6 weeks for repeat follow-up. ?  ?30  minutes were dedicated to this visit.  The time was spent on reviewing laboratory data, disease status update and outlining future plan of care discussion. ? ? ?Zola Button, MD 06/14/2021 8:06 AM ?

## 2021-06-14 NOTE — Patient Instructions (Signed)
[facility]   Discharge Instructions: Thank you for choosing [facility] to provide your oncology and hematology care.   If you have a lab appointment with the Cancer Center, please go directly to the Cancer Center and check in at the registration area.   Wear comfortable clothing and clothing appropriate for easy access to any Portacath or PICC line.   We strive to give you quality time with your provider. You may need to reschedule your appointment if you arrive late (15 or more minutes).  Arriving late affects you and other patients whose appointments are after yours.  Also, if you miss three or more appointments without notifying the office, you may be dismissed from the clinic at the provider's discretion.      For prescription refill requests, have your pharmacy contact our office and allow 72 hours for refills to be completed.    Today you received the following chemotherapy and/or immunotherapy agents: Pembrolizumab.      To help prevent nausea and vomiting after your treatment, we encourage you to take your nausea medication as directed.  BELOW ARE SYMPTOMS THAT SHOULD BE REPORTED IMMEDIATELY: *FEVER GREATER THAN 100.4 F (38 C) OR HIGHER *CHILLS OR SWEATING *NAUSEA AND VOMITING THAT IS NOT CONTROLLED WITH YOUR NAUSEA MEDICATION *UNUSUAL SHORTNESS OF BREATH *UNUSUAL BRUISING OR BLEEDING *URINARY PROBLEMS (pain or burning when urinating, or frequent urination) *BOWEL PROBLEMS (unusual diarrhea, constipation, pain near the anus) TENDERNESS IN MOUTH AND THROAT WITH OR WITHOUT PRESENCE OF ULCERS (sore throat, sores in mouth, or a toothache) UNUSUAL RASH, SWELLING OR PAIN  UNUSUAL VAGINAL DISCHARGE OR ITCHING   Items with * indicate a potential emergency and should be followed up as soon as possible or go to the Emergency Department if any problems should occur.  Please show the CHEMOTHERAPY ALERT CARD or IMMUNOTHERAPY ALERT CARD at  check-in to the Emergency Department and triage nurse.  Should you have questions after your visit or need to cancel or reschedule your appointment, please contact [facility]  Dept: [phone]  and follow the prompts.  Office hours are 8:00 a.m. to 4:30 p.m. Monday - Friday. Please note that voicemails left after 4:00 p.m. may not be returned until the following business day.  We are closed weekends and major holidays. You have access to a nurse at all times for urgent questions. Please call the main number to the clinic Dept: [phone] and follow the prompts.   For any non-urgent questions, you may also contact your provider using MyChart. We now offer e-Visits for anyone 18 and older to request care online for non-urgent symptoms. For details visit [email or web address].   Also download the MyChart app! Go to the app store, search "MyChart", open the app, select [facility], and log in with your MyChart username and password.  Due to Covid, a mask is required upon entering the hospital/clinic. If you do not have a mask, one will be given to you upon arrival. For doctor visits, patients may have 1 support person aged 18 or older with them. For treatment visits, patients cannot have anyone with them due to current Covid guidelines and our immunocompromised population.   

## 2021-08-01 ENCOUNTER — Other Ambulatory Visit: Payer: Self-pay | Admitting: *Deleted

## 2021-08-01 NOTE — Progress Notes (Signed)
No note

## 2021-08-02 ENCOUNTER — Other Ambulatory Visit: Payer: 59

## 2021-08-02 ENCOUNTER — Other Ambulatory Visit: Payer: Self-pay

## 2021-08-02 ENCOUNTER — Inpatient Hospital Stay (HOSPITAL_BASED_OUTPATIENT_CLINIC_OR_DEPARTMENT_OTHER): Payer: 59 | Admitting: Oncology

## 2021-08-02 ENCOUNTER — Ambulatory Visit: Payer: 59

## 2021-08-02 ENCOUNTER — Inpatient Hospital Stay: Payer: 59

## 2021-08-02 ENCOUNTER — Inpatient Hospital Stay: Payer: 59 | Attending: Oncology

## 2021-08-02 VITALS — BP 141/83 | HR 67 | Temp 97.7°F | Resp 18 | Wt 259.3 lb

## 2021-08-02 VITALS — BP 129/74 | HR 67 | Temp 98.0°F | Resp 18 | Wt 258.5 lb

## 2021-08-02 DIAGNOSIS — C679 Malignant neoplasm of bladder, unspecified: Secondary | ICD-10-CM | POA: Insufficient documentation

## 2021-08-02 DIAGNOSIS — C670 Malignant neoplasm of trigone of bladder: Secondary | ICD-10-CM | POA: Diagnosis not present

## 2021-08-02 DIAGNOSIS — Z5112 Encounter for antineoplastic immunotherapy: Secondary | ICD-10-CM | POA: Insufficient documentation

## 2021-08-02 DIAGNOSIS — Z79899 Other long term (current) drug therapy: Secondary | ICD-10-CM | POA: Diagnosis not present

## 2021-08-02 DIAGNOSIS — Z9221 Personal history of antineoplastic chemotherapy: Secondary | ICD-10-CM | POA: Diagnosis not present

## 2021-08-02 DIAGNOSIS — C772 Secondary and unspecified malignant neoplasm of intra-abdominal lymph nodes: Secondary | ICD-10-CM | POA: Insufficient documentation

## 2021-08-02 LAB — CBC WITH DIFFERENTIAL (CANCER CENTER ONLY)
Abs Immature Granulocytes: 0.01 10*3/uL (ref 0.00–0.07)
Basophils Absolute: 0.1 10*3/uL (ref 0.0–0.1)
Basophils Relative: 1 %
Eosinophils Absolute: 0.3 10*3/uL (ref 0.0–0.5)
Eosinophils Relative: 5 %
HCT: 41.9 % (ref 39.0–52.0)
Hemoglobin: 14.5 g/dL (ref 13.0–17.0)
Immature Granulocytes: 0 %
Lymphocytes Relative: 38 %
Lymphs Abs: 2.1 10*3/uL (ref 0.7–4.0)
MCH: 28.8 pg (ref 26.0–34.0)
MCHC: 34.6 g/dL (ref 30.0–36.0)
MCV: 83.3 fL (ref 80.0–100.0)
Monocytes Absolute: 0.6 10*3/uL (ref 0.1–1.0)
Monocytes Relative: 11 %
Neutro Abs: 2.5 10*3/uL (ref 1.7–7.7)
Neutrophils Relative %: 45 %
Platelet Count: 134 10*3/uL — ABNORMAL LOW (ref 150–400)
RBC: 5.03 MIL/uL (ref 4.22–5.81)
RDW: 12.6 % (ref 11.5–15.5)
WBC Count: 5.5 10*3/uL (ref 4.0–10.5)
nRBC: 0 % (ref 0.0–0.2)

## 2021-08-02 LAB — CMP (CANCER CENTER ONLY)
ALT: 20 U/L (ref 0–44)
AST: 18 U/L (ref 15–41)
Albumin: 4.4 g/dL (ref 3.5–5.0)
Alkaline Phosphatase: 60 U/L (ref 38–126)
Anion gap: 8 (ref 5–15)
BUN: 20 mg/dL (ref 8–23)
CO2: 26 mmol/L (ref 22–32)
Calcium: 9.2 mg/dL (ref 8.9–10.3)
Chloride: 103 mmol/L (ref 98–111)
Creatinine: 1.24 mg/dL (ref 0.61–1.24)
GFR, Estimated: >60 mL/min (ref >60)
Glucose, Bld: 139 mg/dL — ABNORMAL HIGH (ref 70–99)
Potassium: 3.8 mmol/L (ref 3.5–5.1)
Sodium: 137 mmol/L (ref 135–145)
Total Bilirubin: 0.4 mg/dL (ref 0.3–1.2)
Total Protein: 7.2 g/dL (ref 6.5–8.1)

## 2021-08-02 LAB — TSH: TSH: 2.822 u[IU]/mL (ref 0.350–4.500)

## 2021-08-02 MED ORDER — SODIUM CHLORIDE 0.9 % IV SOLN: Freq: Once | INTRAVENOUS | Status: AC

## 2021-08-02 MED ORDER — SODIUM CHLORIDE 0.9 % IV SOLN
400.0000 mg | Freq: Once | INTRAVENOUS | Status: AC
  Administered 2021-08-02: 400 mg via INTRAVENOUS
  Filled 2021-08-02: qty 16

## 2021-08-02 NOTE — Patient Instructions (Signed)
[facility]   Discharge Instructions: Thank you for choosing [facility] to provide your oncology and hematology care.   If you have a lab appointment with the Cancer Center, please go directly to the Cancer Center and check in at the registration area.   Wear comfortable clothing and clothing appropriate for easy access to any Portacath or PICC line.   We strive to give you quality time with your provider. You may need to reschedule your appointment if you arrive late (15 or more minutes).  Arriving late affects you and other patients whose appointments are after yours.  Also, if you miss three or more appointments without notifying the office, you may be dismissed from the clinic at the provider's discretion.      For prescription refill requests, have your pharmacy contact our office and allow 72 hours for refills to be completed.    Today you received the following chemotherapy and/or immunotherapy agents: Pembrolizumab.      To help prevent nausea and vomiting after your treatment, we encourage you to take your nausea medication as directed.  BELOW ARE SYMPTOMS THAT SHOULD BE REPORTED IMMEDIATELY: *FEVER GREATER THAN 100.4 F (38 C) OR HIGHER *CHILLS OR SWEATING *NAUSEA AND VOMITING THAT IS NOT CONTROLLED WITH YOUR NAUSEA MEDICATION *UNUSUAL SHORTNESS OF BREATH *UNUSUAL BRUISING OR BLEEDING *URINARY PROBLEMS (pain or burning when urinating, or frequent urination) *BOWEL PROBLEMS (unusual diarrhea, constipation, pain near the anus) TENDERNESS IN MOUTH AND THROAT WITH OR WITHOUT PRESENCE OF ULCERS (sore throat, sores in mouth, or a toothache) UNUSUAL RASH, SWELLING OR PAIN  UNUSUAL VAGINAL DISCHARGE OR ITCHING   Items with * indicate a potential emergency and should be followed up as soon as possible or go to the Emergency Department if any problems should occur.  Please show the CHEMOTHERAPY ALERT CARD or IMMUNOTHERAPY ALERT CARD at  check-in to the Emergency Department and triage nurse.  Should you have questions after your visit or need to cancel or reschedule your appointment, please contact [facility]  Dept: [phone]  and follow the prompts.  Office hours are 8:00 a.m. to 4:30 p.m. Monday - Friday. Please note that voicemails left after 4:00 p.m. may not be returned until the following business day.  We are closed weekends and major holidays. You have access to a nurse at all times for urgent questions. Please call the main number to the clinic Dept: [phone] and follow the prompts.   For any non-urgent questions, you may also contact your provider using MyChart. We now offer e-Visits for anyone 18 and older to request care online for non-urgent symptoms. For details visit [email or web address].   Also download the MyChart app! Go to the app store, search "MyChart", open the app, select [facility], and log in with your MyChart username and password.  Due to Covid, a mask is required upon entering the hospital/clinic. If you do not have a mask, one will be given to you upon arrival. For doctor visits, patients may have 1 support person aged 18 or older with them. For treatment visits, patients cannot have anyone with them due to current Covid guidelines and our immunocompromised population.   

## 2021-08-02 NOTE — Progress Notes (Signed)
Hematology and Oncology Follow Up  ? ?Joanne Sumners ?[id number] ?February 06, 1961 61 y.o. ?08/02/2021 8:03 AM ?[name], MDGreene, [name], MD  ? ? ? ? 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  He was found to have PD-L1 score of 100 in December 2021. ? ?Prior Therapy:  ? ?He is status post cystoscopy, ureteroscopy as well as a left ureteral stent placement on March 21, 2020.  This was performed by Dr. Louis Meckel. ? ?Chemotherapy utilizing gemcitabine and cisplatin started on March 27, 2020.  He completed 3 cycles of therapy. ? ?Current therapy: Pembrolizumab 200 mg every 3 weeks.  Cycle 1 started on 06/08/2020.  He has been receiving 400 mg every 6 weeks starting with cycle 2 of therapy.  He is here for the next treatment. ? 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  Since the last visit, he reports no major changes in his health.  He continues to tolerate immunotherapy without any complaints.  He denies any nausea, vomiting or abdominal pain.  He denies any hospitalizations or illnesses.  His performance status and quality of life remain excellent.  He denies any skin rash or lesions.  Continues to exercise regularly. ? ?Medications: Reviewed without changes. ?Current Outpatient Medications  ?Medication Sig Dispense Refill  ? Cholecalciferol (VITAMIN D-3 PO) Take by mouth.    ? ibuprofen (ADVIL) 200 MG tablet Take 800 mg by mouth every 6 (six) hours as needed for headache or moderate pain.    ? Multiple Vitamins-Minerals (ZINC PO) Take by mouth daily.    ? ondansetron (ZOFRAN) 8 MG tablet Take 1 tablet (8 mg total) by mouth every 8 (eight) hours as needed for nausea or vomiting. 20 tablet 1  ? phenazopyridine (PYRIDIUM) 200 MG tablet Take 1 tablet (200 mg total) by mouth 3 (three) times daily as needed for pain. 10 tablet 0  ? tamsulosin (FLOMAX) 0.4 MG CAPS capsule Take 1 capsule (0.4 mg total) by mouth  daily. 30 capsule 11  ? traMADol (ULTRAM) 50 MG tablet Take 1-2 tablets (50-100 mg total) by mouth every 6 (six) hours as needed for moderate pain. 15 tablet 0  ? ?No current facility-administered medications for this visit.  ? ? ? ?Allergies: No Known Allergies ? ?Physical exam ? ? ? ? ? ? ?Blood pressure (!) 141/83, pulse 67, temperature 97.7 ?F (36.5 ?C), temperature source Temporal, resp. rate 18, weight 259 lb 4.8 oz (117.6 kg), SpO2 100 %. ? ? ? ? ? ? ?ECOG 1 ?  ? ? ?General appearance: Comfortable appearing without any discomfort ?Head: Normocephalic without any trauma ?Oropharynx: Mucous membranes are moist and pink without any thrush or ulcers. ?Eyes: Pupils are equal and round reactive to light. ?Lymph nodes: No cervical, supraclavicular, inguinal or axillary lymphadenopathy.   ?Heart:regular rate and rhythm.  S1 and S2 without leg edema. ?Lung: Clear without any rhonchi or wheezes.  No dullness to percussion. ?Abdomin: Soft, nontender, nondistended with good bowel sounds.  No hepatosplenomegaly. ?Musculoskeletal: No joint deformity or effusion.  Full range of motion noted. ?Neurological: No deficits noted on motor, sensory and deep tendon reflex exam. ?Skin: No petechial rash or dryness.  Appeared moist.  ? ? ? ? ? ? ? ? ? ? ? ? ? ? ?Lab Results: ?Lab Results  ?Component Value Date  ? WBC 5.9 06/14/2021  ? HGB 14.3 06/14/2021  ? HCT 41.5 06/14/2021  ? MCV 83.7 06/14/2021  ? PLT  137 (L) 06/14/2021  ? ?  Chemistry   ?   ?Component Value Date/Time  ? NA 140 06/14/2021 0819  ? K 3.9 06/14/2021 0819  ? CL 104 06/14/2021 0819  ? CO2 28 06/14/2021 0819  ? BUN 23 (H) 06/14/2021 0819  ? CREATININE 1.25 (H) 06/14/2021 3785  ? CREATININE 1.21 12/26/2017 1007  ?    ?Component Value Date/Time  ? CALCIUM 9.5 06/14/2021 0819  ? ALKPHOS 56 06/14/2021 0819  ? AST 19 06/14/2021 0819  ? ALT 22 06/14/2021 0819  ? BILITOT 0.4 06/14/2021 0819  ?  ? ? ?  ? ?  ? ?Impression and Plan: ? ? ?61 year old man with: ?  ?1.  Bladder  cancer diagnosed in December 2021.  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 ? ?He has achieved a complete response to chemotherapy and immunotherapy without any residual disease.  Risks and benefits of continuing Pembrolizumab were discussed at this time.  Complications that include autoimmune concerns, GI toxicity among others were reiterated.  Plan is to update his staging scan before the next visit.  He is agreeable to proceed. ? ? ?2.  IV access: No issues reported with peripheral veins at this time.  Port-A-Cath option has been deferred. ?  ?3.  Antiemetics: Compazine remains available to him without any nausea or vomiting. ?   ?4.  Renal function surveillance: Creatinine clearance continues to be within normal range and we will continue to monitor on subsequent visits. ?  ?5.  Goals of care: Although his disease might not be curable aggressive measures are warranted given his excellent response and performance status. ? ?6.  Immune mediated complications: He has not experienced any complications.  These include pneumonitis, colitis and thyroid disease. ? ? ?7.  Follow-up: He will return in 6 weeks for a follow-up visit. ?  ?30  minutes were spent on this encounter.  The time was dedicated to reviewing laboratory data, disease status update and future plan of care discussion. ? ? ?Zola Button, MD 08/02/2021 8:03 AM ?

## 2021-09-12 NOTE — Progress Notes (Signed)
..  Patient is receiving Assistance Medication - Supplied Externally. Medication: Keytruda Manufacture: [employer] Approval Dates: Approved from 09/11/2021 until 09/12/2022. ID: [id number] Reason: Insurance Denial

## 2021-09-18 ENCOUNTER — Ambulatory Visit (HOSPITAL_COMMUNITY)
Admission: RE | Admit: 2021-09-18 | Discharge: 2021-09-18 | Disposition: A | Discharge status: Home / Self Care | Payer: 59 | Source: Ambulatory Visit | Attending: Oncology | Admitting: Oncology

## 2021-09-18 DIAGNOSIS — C670 Malignant neoplasm of trigone of bladder: Secondary | ICD-10-CM | POA: Insufficient documentation

## 2021-09-18 MED ORDER — SODIUM CHLORIDE (PF) 0.9 % IJ SOLN
INTRAMUSCULAR | Status: AC
  Filled 2021-09-18: qty 250

## 2021-09-18 MED ORDER — IOHEXOL 300 MG/ML  SOLN
100.0000 mL | Freq: Once | INTRAMUSCULAR | Status: AC | PRN
  Administered 2021-09-18: 100 mL via INTRAVENOUS

## 2021-09-20 ENCOUNTER — Inpatient Hospital Stay: Payer: 59

## 2021-09-20 ENCOUNTER — Inpatient Hospital Stay: Payer: 59 | Attending: Oncology

## 2021-09-20 ENCOUNTER — Other Ambulatory Visit: Payer: Self-pay

## 2021-09-20 ENCOUNTER — Inpatient Hospital Stay (HOSPITAL_BASED_OUTPATIENT_CLINIC_OR_DEPARTMENT_OTHER): Payer: 59 | Admitting: Oncology

## 2021-09-20 ENCOUNTER — Ambulatory Visit: Payer: 59 | Admitting: Oncology

## 2021-09-20 VITALS — BP 139/81 | HR 78 | Temp 97.7°F | Ht 72.0 in | Wt 253.0 lb

## 2021-09-20 VITALS — BP 120/83 | HR 63 | Temp 98.4°F | Resp 18

## 2021-09-20 DIAGNOSIS — C679 Malignant neoplasm of bladder, unspecified: Secondary | ICD-10-CM | POA: Diagnosis present

## 2021-09-20 DIAGNOSIS — Z79899 Other long term (current) drug therapy: Secondary | ICD-10-CM | POA: Diagnosis not present

## 2021-09-20 DIAGNOSIS — C670 Malignant neoplasm of trigone of bladder: Secondary | ICD-10-CM | POA: Diagnosis not present

## 2021-09-20 DIAGNOSIS — C772 Secondary and unspecified malignant neoplasm of intra-abdominal lymph nodes: Secondary | ICD-10-CM | POA: Insufficient documentation

## 2021-09-20 DIAGNOSIS — Z5112 Encounter for antineoplastic immunotherapy: Secondary | ICD-10-CM | POA: Insufficient documentation

## 2021-09-20 LAB — CBC WITH DIFFERENTIAL (CANCER CENTER ONLY)
Abs Immature Granulocytes: 0.01 10*3/uL (ref 0.00–0.07)
Basophils Absolute: 0.1 10*3/uL (ref 0.0–0.1)
Basophils Relative: 1 %
Eosinophils Absolute: 0.2 10*3/uL (ref 0.0–0.5)
Eosinophils Relative: 4 %
HCT: 42.6 % (ref 39.0–52.0)
Hemoglobin: 14.2 g/dL (ref 13.0–17.0)
Immature Granulocytes: 0 %
Lymphocytes Relative: 30 %
Lymphs Abs: 1.8 10*3/uL (ref 0.7–4.0)
MCH: 27.8 pg (ref 26.0–34.0)
MCHC: 33.3 g/dL (ref 30.0–36.0)
MCV: 83.5 fL (ref 80.0–100.0)
Monocytes Absolute: 0.5 10*3/uL (ref 0.1–1.0)
Monocytes Relative: 9 %
Neutro Abs: 3.4 10*3/uL (ref 1.7–7.7)
Neutrophils Relative %: 56 %
Platelet Count: 148 10*3/uL — ABNORMAL LOW (ref 150–400)
RBC: 5.1 MIL/uL (ref 4.22–5.81)
RDW: 12.3 % (ref 11.5–15.5)
WBC Count: 6.1 10*3/uL (ref 4.0–10.5)
nRBC: 0 % (ref 0.0–0.2)

## 2021-09-20 LAB — TSH: TSH: 1.192 u[IU]/mL (ref 0.350–4.500)

## 2021-09-20 LAB — CMP (CANCER CENTER ONLY)
ALT: 15 U/L (ref 0–44)
AST: 16 U/L (ref 15–41)
Albumin: 4.2 g/dL (ref 3.5–5.0)
Alkaline Phosphatase: 59 U/L (ref 38–126)
Anion gap: 9 (ref 5–15)
BUN: 20 mg/dL (ref 8–23)
CO2: 27 mmol/L (ref 22–32)
Calcium: 9.6 mg/dL (ref 8.9–10.3)
Chloride: 104 mmol/L (ref 98–111)
Creatinine: 1.31 mg/dL — ABNORMAL HIGH (ref 0.61–1.24)
GFR, Estimated: >60 mL/min (ref >60)
Glucose, Bld: 145 mg/dL — ABNORMAL HIGH (ref 70–99)
Potassium: 3.7 mmol/L (ref 3.5–5.1)
Sodium: 140 mmol/L (ref 135–145)
Total Bilirubin: 0.6 mg/dL (ref 0.3–1.2)
Total Protein: 7.4 g/dL (ref 6.5–8.1)

## 2021-09-20 MED ORDER — SODIUM CHLORIDE 0.9 % IV SOLN: Freq: Once | INTRAVENOUS | Status: AC

## 2021-09-20 MED ORDER — SODIUM CHLORIDE 0.9 % IV SOLN
400.0000 mg | Freq: Once | INTRAVENOUS | Status: AC
  Administered 2021-09-20: 400 mg via INTRAVENOUS
  Filled 2021-09-20: qty 16

## 2021-09-20 NOTE — Progress Notes (Signed)
Hematology and Oncology Follow Up   Edward [name] [id number] 07-23-1960 61 y.o. 09/20/2021 7:45 AM [name], MDGreene, [name], MD      Principle Diagnosis: 61 year old man with bladder cancer diagnosed in December 2021.  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  He was found to have PD-L1 score of 100.  Prior Therapy:   He is status post cystoscopy, ureteroscopy as well as a left ureteral stent placement on March 21, 2020.  This was performed by Dr. Louis Meckel.  Chemotherapy utilizing gemcitabine and cisplatin started on March 27, 2020.  He completed 3 cycles of therapy.  Current therapy: Pembrolizumab 200 mg every 3 weeks.  Cycle 1 started on 06/08/2020.  He has been receiving 400 mg every 6 weeks starting with cycle 2 of therapy.  He returns for the next cycle of treatment.  Interim History: [name] returns today for a follow-up visit.  Since last visit, he reports feeling well without any major complaints.  He denies any nausea, vomiting or abdominal pain.  He denies any hospitalizations or illnesses.  He reports no hematochezia, melena or hemoptysis.  His performance status quality of life remains unchanged.  Medications: Updated on review. Current Outpatient Medications  Medication Sig Dispense Refill   Cholecalciferol (VITAMIN D-3 PO) Take by mouth.     ibuprofen (ADVIL) 200 MG tablet Take 800 mg by mouth every 6 (six) hours as needed for headache or moderate pain. (Patient not taking: Reported on 08/02/2021)     Multiple Vitamins-Minerals (ZINC PO) Take by mouth daily. (Patient not taking: Reported on 08/02/2021)     ondansetron (ZOFRAN) 8 MG tablet Take 1 tablet (8 mg total) by mouth every 8 (eight) hours as needed for nausea or vomiting. 20 tablet 1   phenazopyridine (PYRIDIUM) 200 MG tablet Take 1 tablet (200 mg total) by mouth 3 (three) times daily as needed for pain. (Patient not taking: Reported on 08/02/2021) 10 tablet 0    tamsulosin (FLOMAX) 0.4 MG CAPS capsule Take 1 capsule (0.4 mg total) by mouth daily. 30 capsule 11   traMADol (ULTRAM) 50 MG tablet Take 1-2 tablets (50-100 mg total) by mouth every 6 (six) hours as needed for moderate pain. 15 tablet 0   No current facility-administered medications for this visit.     Allergies: No Known Allergies  Physical exam        Blood pressure 139/81, pulse 78, temperature 97.7 F (36.5 C), temperature source Tympanic, height 6' (1.829 m), weight 253 lb (114.8 kg), SpO2 100 %.       ECOG 1     General appearance: Alert, awake without any distress. Head: Atraumatic without abnormalities Oropharynx: Without any thrush or ulcers. Eyes: No scleral icterus. Lymph nodes: No lymphadenopathy noted in the cervical, supraclavicular, or axillary nodes Heart:regular rate and rhythm, without any murmurs or gallops.   Lung: Clear to auscultation without any rhonchi, wheezes or dullness to percussion. Abdomin: Soft, nontender without any shifting dullness or ascites. Musculoskeletal: No clubbing or cyanosis. Neurological: No motor or sensory deficits. Skin: No rashes or lesions.                Lab Results: Lab Results  Component Value Date   WBC 5.5 08/02/2021   HGB 14.5 08/02/2021   HCT 41.9 08/02/2021   MCV 83.3 08/02/2021   PLT 134 (L) 08/02/2021     Chemistry      Component Value Date/Time   NA 137 08/02/2021 0804  K 3.8 08/02/2021 0804   CL 103 08/02/2021 0804   CO2 26 08/02/2021 0804   BUN 20 08/02/2021 0804   CREATININE 1.24 08/02/2021 0804   CREATININE 1.21 12/26/2017 1007      Component Value Date/Time   CALCIUM 9.2 08/02/2021 0804   ALKPHOS 60 08/02/2021 0804   AST 18 08/02/2021 0804   ALT 20 08/02/2021 0804   BILITOT 0.4 08/02/2021 0804            Impression and Plan:   61 year old man with:   1.  Stage IV high-grade urothelial carcinoma of the bladder with pelvic adenopathy.  He continues to  tolerate Pembrolizumab without any major complications.  Risks and benefits of continuing this treatment long-term were reviewed.  CT scan obtained on 09/18/2021 was reviewed although the final results are pending.  Assuming continued complete response I recommended continuing the same dose and schedule.   2.  IV access: Peripheral veins continue to be in use without any issues.  Port-A-Cath option has been deferred.   3.  Antiemetics: No nausea or vomiting reported at this time.  Compazine is available to him.   4.  Renal function surveillance: Kidney function remains close to baseline with creatinine clearance over 60 cc/min.   5.  Goals of care: Aggressive measures are warranted given his excellent performance status and response.  6.  Immune mediated complications: I continue to educate him about potential complication clued pneumonitis, colitis and thyroid disease.   7.  Follow-up: In 6 weeks for repeat follow-up.   30  minutes were dedicated to this visit.  The time was spent on reviewing laboratory data, disease status update and future plan of care discussion.  Zola Button, MD 09/20/2021 7:45 AM

## 2021-09-20 NOTE — Patient Instructions (Signed)
[facility]   Discharge Instructions: Thank you for choosing [facility] to provide your oncology and hematology care.   If you have a lab appointment with the Cancer Center, please go directly to the Cancer Center and check in at the registration area.   Wear comfortable clothing and clothing appropriate for easy access to any Portacath or PICC line.   We strive to give you quality time with your provider. You may need to reschedule your appointment if you arrive late (15 or more minutes).  Arriving late affects you and other patients whose appointments are after yours.  Also, if you miss three or more appointments without notifying the office, you may be dismissed from the clinic at the provider's discretion.      For prescription refill requests, have your pharmacy contact our office and allow 72 hours for refills to be completed.    Today you received the following chemotherapy and/or immunotherapy agents: Pembrolizumab.      To help prevent nausea and vomiting after your treatment, we encourage you to take your nausea medication as directed.  BELOW ARE SYMPTOMS THAT SHOULD BE REPORTED IMMEDIATELY: *FEVER GREATER THAN 100.4 F (38 C) OR HIGHER *CHILLS OR SWEATING *NAUSEA AND VOMITING THAT IS NOT CONTROLLED WITH YOUR NAUSEA MEDICATION *UNUSUAL SHORTNESS OF BREATH *UNUSUAL BRUISING OR BLEEDING *URINARY PROBLEMS (pain or burning when urinating, or frequent urination) *BOWEL PROBLEMS (unusual diarrhea, constipation, pain near the anus) TENDERNESS IN MOUTH AND THROAT WITH OR WITHOUT PRESENCE OF ULCERS (sore throat, sores in mouth, or a toothache) UNUSUAL RASH, SWELLING OR PAIN  UNUSUAL VAGINAL DISCHARGE OR ITCHING   Items with * indicate a potential emergency and should be followed up as soon as possible or go to the Emergency Department if any problems should occur.  Please show the CHEMOTHERAPY ALERT CARD or IMMUNOTHERAPY ALERT CARD at  check-in to the Emergency Department and triage nurse.  Should you have questions after your visit or need to cancel or reschedule your appointment, please contact [facility]  Dept: [phone]  and follow the prompts.  Office hours are 8:00 a.m. to 4:30 p.m. Monday - Friday. Please note that voicemails left after 4:00 p.m. may not be returned until the following business day.  We are closed weekends and major holidays. You have access to a nurse at all times for urgent questions. Please call the main number to the clinic Dept: [phone] and follow the prompts.   For any non-urgent questions, you may also contact your provider using MyChart. We now offer e-Visits for anyone 18 and older to request care online for non-urgent symptoms. For details visit [email or web address].   Also download the MyChart app! Go to the app store, search "MyChart", open the app, select [facility], and log in with your MyChart username and password.  Due to Covid, a mask is required upon entering the hospital/clinic. If you do not have a mask, one will be given to you upon arrival. For doctor visits, patients may have 1 support person aged 18 or older with them. For treatment visits, patients cannot have anyone with them due to current Covid guidelines and our immunocompromised population.   

## 2021-09-30 ENCOUNTER — Telehealth: Payer: Self-pay | Admitting: Oncology

## 2021-09-30 NOTE — Telephone Encounter (Signed)
Called patient regarding upcoming August appointments, patient has been called and notified. 

## 2021-10-28 ENCOUNTER — Other Ambulatory Visit: Payer: Self-pay

## 2021-11-08 ENCOUNTER — Inpatient Hospital Stay: Payer: 59

## 2021-11-08 ENCOUNTER — Other Ambulatory Visit: Payer: Self-pay

## 2021-11-08 ENCOUNTER — Inpatient Hospital Stay (HOSPITAL_BASED_OUTPATIENT_CLINIC_OR_DEPARTMENT_OTHER): Payer: 59 | Admitting: Oncology

## 2021-11-08 ENCOUNTER — Inpatient Hospital Stay: Payer: 59 | Attending: Oncology

## 2021-11-08 VITALS — BP 154/86 | HR 75 | Temp 97.7°F | Resp 15 | Wt 258.2 lb

## 2021-11-08 DIAGNOSIS — C772 Secondary and unspecified malignant neoplasm of intra-abdominal lymph nodes: Secondary | ICD-10-CM | POA: Insufficient documentation

## 2021-11-08 DIAGNOSIS — Z5112 Encounter for antineoplastic immunotherapy: Secondary | ICD-10-CM | POA: Insufficient documentation

## 2021-11-08 DIAGNOSIS — C670 Malignant neoplasm of trigone of bladder: Secondary | ICD-10-CM

## 2021-11-08 DIAGNOSIS — C679 Malignant neoplasm of bladder, unspecified: Secondary | ICD-10-CM

## 2021-11-08 DIAGNOSIS — Z79899 Other long term (current) drug therapy: Secondary | ICD-10-CM | POA: Diagnosis not present

## 2021-11-08 LAB — CMP (CANCER CENTER ONLY)
ALT: 19 U/L (ref 0–44)
AST: 18 U/L (ref 15–41)
Albumin: 4.6 g/dL (ref 3.5–5.0)
Alkaline Phosphatase: 61 U/L (ref 38–126)
Anion gap: 8 (ref 5–15)
BUN: 22 mg/dL (ref 8–23)
CO2: 27 mmol/L (ref 22–32)
Calcium: 9.1 mg/dL (ref 8.9–10.3)
Chloride: 103 mmol/L (ref 98–111)
Creatinine: 1.27 mg/dL — ABNORMAL HIGH (ref 0.61–1.24)
GFR, Estimated: >60 mL/min (ref >60)
Glucose, Bld: 122 mg/dL — ABNORMAL HIGH (ref 70–99)
Potassium: 3.7 mmol/L (ref 3.5–5.1)
Sodium: 138 mmol/L (ref 135–145)
Total Bilirubin: 0.5 mg/dL (ref 0.3–1.2)
Total Protein: 7.8 g/dL (ref 6.5–8.1)

## 2021-11-08 LAB — CBC WITH DIFFERENTIAL (CANCER CENTER ONLY)
Abs Immature Granulocytes: 0.01 10*3/uL (ref 0.00–0.07)
Basophils Absolute: 0.1 10*3/uL (ref 0.0–0.1)
Basophils Relative: 1 %
Eosinophils Absolute: 0.3 10*3/uL (ref 0.0–0.5)
Eosinophils Relative: 5 %
HCT: 43.6 % (ref 39.0–52.0)
Hemoglobin: 15.3 g/dL (ref 13.0–17.0)
Immature Granulocytes: 0 %
Lymphocytes Relative: 40 %
Lymphs Abs: 2.2 10*3/uL (ref 0.7–4.0)
MCH: 29.2 pg (ref 26.0–34.0)
MCHC: 35.1 g/dL (ref 30.0–36.0)
MCV: 83.2 fL (ref 80.0–100.0)
Monocytes Absolute: 0.5 10*3/uL (ref 0.1–1.0)
Monocytes Relative: 10 %
Neutro Abs: 2.4 10*3/uL (ref 1.7–7.7)
Neutrophils Relative %: 44 %
Platelet Count: 151 10*3/uL (ref 150–400)
RBC: 5.24 MIL/uL (ref 4.22–5.81)
RDW: 12.9 % (ref 11.5–15.5)
WBC Count: 5.6 10*3/uL (ref 4.0–10.5)
nRBC: 0 % (ref 0.0–0.2)

## 2021-11-08 LAB — TSH: TSH: 2.268 u[IU]/mL (ref 0.350–4.500)

## 2021-11-08 MED ORDER — SODIUM CHLORIDE 0.9 % IV SOLN: Freq: Once | INTRAVENOUS | Status: AC

## 2021-11-08 MED ORDER — SODIUM CHLORIDE 0.9 % IV SOLN
400.0000 mg | Freq: Once | INTRAVENOUS | Status: AC
  Administered 2021-11-08: 400 mg via INTRAVENOUS
  Filled 2021-11-08: qty 16

## 2021-11-08 NOTE — Patient Instructions (Signed)
[facility]  Discharge Instructions: Thank you for choosing [facility] to provide your oncology and hematology care.   If you have a lab appointment with the [facility], please go directly to the [facility] and check in at the registration area.   Wear comfortable clothing and clothing appropriate for easy access to any Portacath or PICC line.   We strive to give you quality time with your provider. You may need to reschedule your appointment if you arrive late (15 or more minutes).  Arriving late affects you and other patients whose appointments are after yours.  Also, if you miss three or more appointments without notifying the office, you may be dismissed from the clinic at the provider's discretion.      For prescription refill requests, have your pharmacy contact our office and allow 72 hours for refills to be completed.    Today you received the following chemotherapy and/or immunotherapy agents: Pembrolizumab.      To help prevent nausea and vomiting after your treatment, we encourage you to take your nausea medication as directed.  BELOW ARE SYMPTOMS THAT SHOULD BE REPORTED IMMEDIATELY: *FEVER GREATER THAN 100.4 F (38 C) OR HIGHER *CHILLS OR SWEATING *NAUSEA AND VOMITING THAT IS NOT CONTROLLED WITH YOUR NAUSEA MEDICATION *UNUSUAL SHORTNESS OF BREATH *UNUSUAL BRUISING OR BLEEDING *URINARY PROBLEMS (pain or burning when urinating, or frequent urination) *BOWEL PROBLEMS (unusual diarrhea, constipation, pain near the anus) TENDERNESS IN MOUTH AND THROAT WITH OR WITHOUT PRESENCE OF ULCERS (sore throat, sores in mouth, or a toothache) UNUSUAL RASH, SWELLING OR PAIN  UNUSUAL VAGINAL DISCHARGE OR ITCHING   Items with * indicate a potential emergency and should be followed up as soon as possible or go to the Emergency Department if any problems should occur.  Please show the CHEMOTHERAPY ALERT CARD or IMMUNOTHERAPY ALERT CARD at  check-in to the Emergency Department and triage nurse.  Should you have questions after your visit or need to cancel or reschedule your appointment, please contact [facility]  Dept: [phone]  and follow the prompts.  Office hours are 8:00 a.m. to 4:30 p.m. Monday - Friday. Please note that voicemails left after 4:00 p.m. may not be returned until the following business day.  We are closed weekends and major holidays. You have access to a nurse at all times for urgent questions. Please call the main number to the clinic Dept: [phone] and follow the prompts.   For any non-urgent questions, you may also contact your provider using MyChart. We now offer e-Visits for anyone 26 and older to request care online for non-urgent symptoms. For details visit [email or web address].   Also download the MyChart app! Go to the app store, search "MyChart", open the app, select [facility], and log in with your MyChart username and password.  Due to Covid, a mask is required upon entering the hospital/clinic. If you do not have a mask, one will be given to you upon arrival. For doctor visits, patients may have 1 support person aged 40 or older with them. For treatment visits, patients cannot have anyone with them due to current Covid guidelines and our immunocompromised population.   Pembrolizumab Injection What is this medication? PEMBROLIZUMAB (PEM broe LIZ ue mab) treats some types of cancer. It works by helping your immune system slow or stop the spread of cancer cells. It is a monoclonal antibody. This medicine may be used for other purposes; ask your health care  provider or pharmacist if you have questions. COMMON BRAND NAME(S): Keytruda What should I tell my care team before I take this medication? They need to know if you have any of these conditions: Allogeneic stem cell transplant (uses someone else's stem cells) Autoimmune diseases, such as Crohn disease,  ulcerative colitis, lupus History of chest radiation Nervous system problems, such as Guillain-Barre syndrome, myasthenia gravis Organ transplant An unusual or allergic reaction to pembrolizumab, other medications, foods, dyes, or preservatives Pregnant or trying to get pregnant Breast-feeding How should I use this medication? This medication is injected into a vein. It is given by your care team in a hospital or clinic setting. A special MedGuide will be given to you before each treatment. Be sure to read this information carefully each time. Talk to your care team about the use of this medication in children. While it may be prescribed for children as young as 6 months for selected conditions, precautions do apply. Overdosage: If you think you have taken too much of this medicine contact a poison control center or emergency room at once. NOTE: This medicine is only for you. Do not share this medicine with others. What if I miss a dose? Keep appointments for follow-up doses. It is important not to miss your dose. Call your care team if you are unable to keep an appointment. What may interact with this medication? Interactions have not been studied. This list may not describe all possible interactions. Give your health care provider a list of all the medicines, herbs, non-prescription drugs, or dietary supplements you use. Also tell them if you smoke, drink alcohol, or use illegal drugs. Some items may interact with your medicine. What should I watch for while using this medication? Your condition will be monitored carefully while you are receiving this medication. You may need blood work while taking this medication. This medication may cause serious skin reactions. They can happen weeks to months after starting the medication. Contact your care team right away if you notice fevers or flu-like symptoms with a rash. The rash may be red or purple and then turn into blisters or peeling of the skin.  You may also notice a red rash with swelling of the face, lips, or lymph nodes in your neck or under your arms. Tell your care team right away if you have any change in your eyesight. Talk to your care team if you may be pregnant. Serious birth defects can occur if you take this medication during pregnancy and for 4 months after the last dose. You will need a negative pregnancy test before starting this medication. Contraception is recommended while taking this medication and for 4 months after the last dose. Your care team can help you find the option that works for you. Do not breastfeed while taking this medication and for 4 months after the last dose. What side effects may I notice from receiving this medication? Side effects that you should report to your care team as soon as possible: Allergic reactions--skin rash, itching, hives, swelling of the face, lips, tongue, or throat Dry cough, shortness of breath or trouble breathing Eye pain, redness, irritation, or discharge with blurry or decreased vision Heart muscle inflammation--unusual weakness or fatigue, shortness of breath, chest pain, fast or irregular heartbeat, dizziness, swelling of the ankles, feet, or hands Hormone gland problems--headache, sensitivity to light, unusual weakness or fatigue, dizziness, fast or irregular heartbeat, increased sensitivity to cold or heat, excessive sweating, constipation, hair loss, increased thirst or amount  of urine, tremors or shaking, irritability Infusion reactions--chest pain, shortness of breath or trouble breathing, feeling faint or lightheaded Kidney injury (glomerulonephritis)--decrease in the amount of urine, red or dark brown urine, foamy or bubbly urine, swelling of the ankles, hands, or feet Liver injury--right upper belly pain, loss of appetite, nausea, light-colored stool, dark yellow or brown urine, yellowing skin or eyes, unusual weakness or fatigue Pain, tingling, or numbness in the hands  or feet, muscle weakness, change in vision, confusion or trouble speaking, loss of balance or coordination, trouble walking, seizures Rash, fever, and swollen lymph nodes Redness, blistering, peeling, or loosening of the skin, including inside the mouth Sudden or severe stomach pain, bloody diarrhea, fever, nausea, vomiting Side effects that usually do not require medical attention (report to your care team if they continue or are bothersome): Bone, joint, or muscle pain Diarrhea Fatigue Loss of appetite Nausea Skin rash This list may not describe all possible side effects. Call your doctor for medical advice about side effects. You may report side effects to FDA at 1-800-FDA-1088. Where should I keep my medication? This medication is given in a hospital or clinic. It will not be stored at home. NOTE: This sheet is a summary. It may not cover all possible information. If you have questions about this medicine, talk to your doctor, pharmacist, or health care provider.  2023 Elsevier/Gold Standard (2021-07-15 00:00:00)

## 2021-11-08 NOTE — Progress Notes (Signed)
Hematology and Oncology Follow Up   Edward [name] [id number] 03/16/1961 61 y.o. 11/08/2021 8:28 AM [name], Jeffrey R, MDGreene, Jeffrey R, MD      Principle Diagnosis: 61-year-old man with stage IV high-grade urothelial carcinoma of the bladder with pelvic adenopathy diagnosed in December 2021.  He was found to have PD-L1 score of 100 and has achieved a complete response.  Prior Therapy:   He is status post cystoscopy, ureteroscopy as well as a left ureteral stent placement on March 21, 2020.  This was performed by Dr. Herrick.  Chemotherapy utilizing gemcitabine and cisplatin started on March 27, 2020.  He completed 3 cycles of therapy.  Current therapy: Pembrolizumab 200 mg every 3 weeks.  Cycle 1 started on 06/08/2020.  He has been receiving 400 mg every 6 weeks starting with cycle 2 of therapy.  He presents for the next cycle of therapy.  Interim History: Dr. Akey presents for a follow-up visit.  Since the last visit, he reports feeling well without any major complaints.  He denies any nausea, vomiting or abdominal pain.  He denies recent hospitalizations or illnesses.  He denies any pathological fractures or weakness.  He denies any urinary complaints.   Medications: Reviewed without changes. Current Outpatient Medications  Medication Sig Dispense Refill   Cholecalciferol (VITAMIN D-3 PO) Take by mouth.     ibuprofen (ADVIL) 200 MG tablet Take 800 mg by mouth every 6 (six) hours as needed for headache or moderate pain. (Patient not taking: Reported on 08/02/2021)     Multiple Vitamins-Minerals (ZINC PO) Take by mouth daily. (Patient not taking: Reported on 08/02/2021)     ondansetron (ZOFRAN) 8 MG tablet Take 1 tablet (8 mg total) by mouth every 8 (eight) hours as needed for nausea or vomiting. 20 tablet 1   phenazopyridine (PYRIDIUM) 200 MG tablet Take 1 tablet (200 mg total) by mouth 3 (three) times daily as needed for pain. (Patient not taking: Reported on 08/02/2021) 10 tablet  0   tamsulosin (FLOMAX) 0.4 MG CAPS capsule Take 1 capsule (0.4 mg total) by mouth daily. 30 capsule 11   traMADol (ULTRAM) 50 MG tablet Take 1-2 tablets (50-100 mg total) by mouth every 6 (six) hours as needed for moderate pain. 15 tablet 0   No current facility-administered medications for this visit.     Allergies: No Known Allergies  Physical exam           Blood pressure (!) 154/86, pulse 75, temperature 97.7 F (36.5 C), temperature source Temporal, resp. rate 15, weight 258 lb 3.2 oz (117.1 kg), SpO2 98 %.     ECOG 1    General appearance: Comfortable appearing without any discomfort Head: Normocephalic without any trauma Oropharynx: Mucous membranes are moist and pink without any thrush or ulcers. Eyes: Pupils are equal and round reactive to light. Lymph nodes: No cervical, supraclavicular, inguinal or axillary lymphadenopathy.   Heart:regular rate and rhythm.  S1 and S2 without leg edema. Lung: Clear without any rhonchi or wheezes.  No dullness to percussion. Abdomin: Soft, nontender, nondistended with good bowel sounds.  No hepatosplenomegaly. Musculoskeletal: No joint deformity or effusion.  Full range of motion noted. Neurological: No deficits noted on motor, sensory and deep tendon reflex exam. Skin: No petechial rash or dryness.  Appeared moist.                  Lab Results: Lab Results  Component Value Date   WBC 6.1 09/20/2021   HGB 14.2 09/20/2021     HCT 42.6 09/20/2021   MCV 83.5 09/20/2021   PLT 148 (L) 09/20/2021     Chemistry      Component Value Date/Time   NA 140 09/20/2021 0747   K 3.7 09/20/2021 0747   CL 104 09/20/2021 0747   CO2 27 09/20/2021 0747   BUN 20 09/20/2021 0747   CREATININE 1.31 (H) 09/20/2021 0747   CREATININE 1.21 12/26/2017 1007      Component Value Date/Time   CALCIUM 9.6 09/20/2021 0747   ALKPHOS 59 09/20/2021 0747   AST 16 09/20/2021 0747   ALT 15 09/20/2021 0747   BILITOT 0.6 09/20/2021 0747             Impression and Plan:   61-year-old man with:   1.  Bladder cancer diagnosed December 2021.  He presented with stage IV high-grade urothelial carcinoma.   He achieved a complete response to chemotherapy and immunotherapy at this time.  Risks and benefits of continuing Pembrolizumab for the time being were reviewed.  Complication associated with this treatment including nausea, fatigue, autoimmune complications were discussed.  He is agreeable to proceed at this time we will update his staging scans and October 2023.  2.  IV access: No issues reported with peripheral veins at this time.   3.  Antiemetics: Compazine is available to him without any nausea or vomiting.   4.  Renal function surveillance: Creatinine clearance continues to be normal.   5.  Goals of care: His disease is incurable although he achieved an excellent response.  6.  Immune mediated complications: Complications such as pneumonitis, colitis and thyroid disease were reiterated.   7.  Follow-up: He will return in 6 weeks for a follow-up.   30  minutes were spent on this encounter.  The time was dedicated to reviewing laboratory data, disease status update and outlining future plan of care discussion.  Firas [name], MD 11/08/2021 8:28 AM 

## 2021-11-09 ENCOUNTER — Other Ambulatory Visit: Payer: Self-pay

## 2021-11-11 ENCOUNTER — Telehealth: Payer: Self-pay | Admitting: Oncology

## 2021-11-11 NOTE — Telephone Encounter (Signed)
Scheduled per 08/04 los, patient has been called and notified.

## 2021-11-17 ENCOUNTER — Other Ambulatory Visit: Payer: Self-pay | Admitting: Oncology

## 2021-11-17 DIAGNOSIS — C679 Malignant neoplasm of bladder, unspecified: Secondary | ICD-10-CM

## 2021-12-06 ENCOUNTER — Other Ambulatory Visit: Payer: Self-pay

## 2021-12-20 ENCOUNTER — Other Ambulatory Visit: Payer: Self-pay

## 2021-12-20 ENCOUNTER — Inpatient Hospital Stay (HOSPITAL_BASED_OUTPATIENT_CLINIC_OR_DEPARTMENT_OTHER): Payer: 59 | Admitting: Oncology

## 2021-12-20 ENCOUNTER — Inpatient Hospital Stay: Payer: 59

## 2021-12-20 ENCOUNTER — Inpatient Hospital Stay: Payer: 59 | Attending: Oncology

## 2021-12-20 VITALS — BP 140/85

## 2021-12-20 VITALS — BP 151/90 | HR 73 | Temp 97.7°F | Resp 17 | Ht 72.0 in | Wt 257.2 lb

## 2021-12-20 DIAGNOSIS — Z79899 Other long term (current) drug therapy: Secondary | ICD-10-CM | POA: Diagnosis not present

## 2021-12-20 DIAGNOSIS — C679 Malignant neoplasm of bladder, unspecified: Secondary | ICD-10-CM | POA: Diagnosis not present

## 2021-12-20 DIAGNOSIS — Z5112 Encounter for antineoplastic immunotherapy: Secondary | ICD-10-CM | POA: Insufficient documentation

## 2021-12-20 DIAGNOSIS — Z9221 Personal history of antineoplastic chemotherapy: Secondary | ICD-10-CM | POA: Insufficient documentation

## 2021-12-20 DIAGNOSIS — C772 Secondary and unspecified malignant neoplasm of intra-abdominal lymph nodes: Secondary | ICD-10-CM | POA: Insufficient documentation

## 2021-12-20 LAB — CBC WITH DIFFERENTIAL (CANCER CENTER ONLY)
Abs Immature Granulocytes: 0.01 10*3/uL (ref 0.00–0.07)
Basophils Absolute: 0.1 10*3/uL (ref 0.0–0.1)
Basophils Relative: 1 %
Eosinophils Absolute: 0.4 10*3/uL (ref 0.0–0.5)
Eosinophils Relative: 6 %
HCT: 43.8 % (ref 39.0–52.0)
Hemoglobin: 15.2 g/dL (ref 13.0–17.0)
Immature Granulocytes: 0 %
Lymphocytes Relative: 37 %
Lymphs Abs: 2.2 10*3/uL (ref 0.7–4.0)
MCH: 28.8 pg (ref 26.0–34.0)
MCHC: 34.7 g/dL (ref 30.0–36.0)
MCV: 83.1 fL (ref 80.0–100.0)
Monocytes Absolute: 0.6 10*3/uL (ref 0.1–1.0)
Monocytes Relative: 10 %
Neutro Abs: 2.7 10*3/uL (ref 1.7–7.7)
Neutrophils Relative %: 46 %
Platelet Count: 149 10*3/uL — ABNORMAL LOW (ref 150–400)
RBC: 5.27 MIL/uL (ref 4.22–5.81)
RDW: 12.5 % (ref 11.5–15.5)
WBC Count: 5.8 10*3/uL (ref 4.0–10.5)
nRBC: 0 % (ref 0.0–0.2)

## 2021-12-20 LAB — CMP (CANCER CENTER ONLY)
ALT: 24 U/L (ref 0–44)
AST: 22 U/L (ref 15–41)
Albumin: 4.4 g/dL (ref 3.5–5.0)
Alkaline Phosphatase: 59 U/L (ref 38–126)
Anion gap: 8 (ref 5–15)
BUN: 25 mg/dL — ABNORMAL HIGH (ref 8–23)
CO2: 26 mmol/L (ref 22–32)
Calcium: 9.2 mg/dL (ref 8.9–10.3)
Chloride: 105 mmol/L (ref 98–111)
Creatinine: 1.21 mg/dL (ref 0.61–1.24)
GFR, Estimated: >60 mL/min (ref >60)
Glucose, Bld: 122 mg/dL — ABNORMAL HIGH (ref 70–99)
Potassium: 3.6 mmol/L (ref 3.5–5.1)
Sodium: 139 mmol/L (ref 135–145)
Total Bilirubin: 0.5 mg/dL (ref 0.3–1.2)
Total Protein: 7.4 g/dL (ref 6.5–8.1)

## 2021-12-20 LAB — TSH: TSH: 2.907 u[IU]/mL (ref 0.350–4.500)

## 2021-12-20 MED ORDER — SODIUM CHLORIDE 0.9 % IV SOLN
400.0000 mg | Freq: Once | INTRAVENOUS | Status: AC
  Administered 2021-12-20: 400 mg via INTRAVENOUS
  Filled 2021-12-20: qty 16

## 2021-12-20 MED ORDER — SODIUM CHLORIDE 0.9 % IV SOLN: Freq: Once | INTRAVENOUS | Status: AC

## 2021-12-20 NOTE — Patient Instructions (Signed)
[facility] MEDICAL [facility]COLOG[facility]   Discharge Instructions: Thank you for choosing [facility] to provide your oncology and hematology care.   If you have a lab appointment with the Cancer Center, please go directly to the Cancer Center and check in at the registration area.   Wear comfortable clothing and clothing appropriate for easy access to any Portacath or PICC line.   We strive to give you quality time with your provider. You may need to reschedule your appointment if you arrive late (15 or more minutes).  Arriving late affects you and other patients whose appointments are after yours.  Also, if you miss three or more appointments without notifying the office, you may be dismissed from the clinic at the provider's discretion.      For prescription refill requests, have your pharmacy contact our office and allow 72 hours for refills to be completed.    Today you received the following chemotherapy and/or immunotherapy agents: pembrolizumab      To help prevent nausea and vomiting after your treatment, we encourage you to take your nausea medication as directed.  BELOW ARE SYMPTOMS THAT SHOULD BE REPORTED IMMEDIATELY: *FEVER GREATER THAN 100.4 F (38 C) OR HIGHER *CHILLS OR SWEATING *NAUSEA AND VOMITING THAT IS NOT CONTROLLED WITH YOUR NAUSEA MEDICATION *UNUSUAL SHORTNESS OF BREATH *UNUSUAL BRUISING OR BLEEDING *URINARY PROBLEMS (pain or burning when urinating, or frequent urination) *BOWEL PROBLEMS (unusual diarrhea, constipation, pain near the anus) TENDERNESS IN MOUTH AND THROAT WITH OR WITHOUT PRESENCE OF ULCERS (sore throat, sores in mouth, or a toothache) UNUSUAL RASH, SWELLING OR PAIN  UNUSUAL VAGINAL DISCHARGE OR ITCHING   Items with * indicate a potential emergency and should be followed up as soon as possible or go to the Emergency Department if any problems should occur.  Please show the CHEMOTHERAPY ALERT CARD or IMMUNOTHERAPY ALERT CARD at  check-in to the Emergency Department and triage nurse.  Should you have questions after your visit or need to cancel or reschedule your appointment, please contact [facility]  Dept: [phone]  and follow the prompts.  Office hours are 8:00 a.m. to 4:30 p.m. Monday - Friday. Please note that voicemails left after 4:00 p.m. may not be returned until the following business day.  We are closed weekends and major holidays. You have access to a nurse at all times for urgent questions. Please call the main number to the clinic Dept: [phone] and follow the prompts.   For any non-urgent questions, you may also contact your provider using MyChart. We now offer e-Visits for anyone 18 and older to request care online for non-urgent symptoms. For details visit [email or web address].   Also download the MyChart app! Go to the app store, search "MyChart", open the app, select [facility], and log in with your MyChart username and password.  Masks are optional in the cancer centers. If you would like for your care team to wear a mask while they are taking care of you, please let them know. You may have one support person who is at least 61 years old accompany you for your appointments. 

## 2021-12-20 NOTE — Progress Notes (Signed)
Hematology and Oncology Follow Up   Edward [name] [id number] May 17, 1960 61 y.o. 12/20/2021 8:06 AM Edward [name], MDGreene, Edward Patrick, MD      Principle Diagnosis: 61 year old man with bladder cancer diagnosed in December 2021.  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  He achieved a complete response to chemotherapy and immunotherapy.  Prior Therapy:   He is status post cystoscopy, ureteroscopy as well as a left ureteral stent placement on March 21, 2020.  This was performed by Dr. Louis Meckel.  Chemotherapy utilizing gemcitabine and cisplatin started on March 27, 2020.  He completed 3 cycles of therapy.  Current therapy: Pembrolizumab 200 mg every 3 weeks.  Cycle 1 started on 06/08/2020.  He has been receiving 400 mg every 6 weeks starting with cycle 2 of therapy.  He returns for the next cycle of therapy.  Interim History: [name] returns today for a follow-up visit.  Since last visit, he reports no major changes in his health.  He continues to tolerate Pembrolizumab without any major complaints.  He denies any nausea, vomiting or abdominal pain.  He denies any recent hospitalizations or illnesses.  He does have some mild residual scrotal edema which has not changed dramatically.  He denies any skin rashes or lesions.  He denies any respiratory complaints.   Medications: Updated on review. Current Outpatient Medications  Medication Sig Dispense Refill   Cholecalciferol (VITAMIN D-3 PO) Take by mouth.     ibuprofen (ADVIL) 200 MG tablet Take 800 mg by mouth every 6 (six) hours as needed for headache or moderate pain. (Patient not taking: Reported on 08/02/2021)     Multiple Vitamins-Minerals (ZINC PO) Take by mouth daily. (Patient not taking: Reported on 08/02/2021)     ondansetron (ZOFRAN) 8 MG tablet Take 1 tablet (8 mg total) by mouth every 8 (eight) hours as needed for nausea or vomiting. 20 tablet  1   phenazopyridine (PYRIDIUM) 200 MG tablet Take 1 tablet (200 mg total) by mouth 3 (three) times daily as needed for pain. (Patient not taking: Reported on 08/02/2021) 10 tablet 0   tamsulosin (FLOMAX) 0.4 MG CAPS capsule Take 1 capsule (0.4 mg total) by mouth daily. 30 capsule 11   traMADol (ULTRAM) 50 MG tablet Take 1-2 tablets (50-100 mg total) by mouth every 6 (six) hours as needed for moderate pain. 15 tablet 0   No current facility-administered medications for this visit.     Allergies: No Known Allergies  Physical exam   Blood pressure (!) 151/90, pulse 73, temperature 97.7 F (36.5 C), temperature source Temporal, resp. rate 17, height 6' (1.829 m), weight 257 lb 3.2 oz (116.7 kg), SpO2 98 %.   ECOG 1     General appearance: Alert, awake without any distress. Head: Atraumatic without abnormalities Oropharynx: Without any thrush or ulcers. Eyes: No scleral icterus. Lymph nodes: No lymphadenopathy noted in the cervical, supraclavicular, or axillary nodes Heart:regular rate and rhythm, without any murmurs or gallops.   Lung: Clear to auscultation without any rhonchi, wheezes or dullness to percussion. Abdomin: Soft, nontender without any shifting dullness or ascites. Musculoskeletal: No clubbing or cyanosis. Neurological: No motor or sensory deficits. Skin: No rashes or lesions.                 Lab Results: Lab Results  Component Value Date   WBC 5.6 11/08/2021   HGB 15.3 11/08/2021   HCT 43.6 11/08/2021  MCV 83.2 11/08/2021   PLT 151 11/08/2021     Chemistry      Component Value Date/Time   NA 138 11/08/2021 0841   K 3.7 11/08/2021 0841   CL 103 11/08/2021 0841   CO2 27 11/08/2021 0841   BUN 22 11/08/2021 0841   CREATININE 1.27 (H) 11/08/2021 0841   CREATININE 1.21 12/26/2017 1007      Component Value Date/Time   CALCIUM 9.1 11/08/2021 0841   ALKPHOS 61 11/08/2021 0841   AST 18 11/08/2021 0841   ALT 19 11/08/2021 0841   BILITOT 0.5  11/08/2021 0841            Impression and Plan:   61 year old man with:   1. Stage IV high-grade urothelial carcinoma of the bladder diagnosed in 2021.  He achieved a complete response currently on maintenance immunotherapy.  The natural course of this disease was reviewed at this time and treatment choices were discussed.  Risks and benefits of continuing Pembrolizumab were reviewed currently.  I have recommended continuing immunotherapy to complete a total of 1 year.  We will update his staging scan in January 2024.   2.  IV access: No Port-A-Cath insertion is needed.  IV access with peripheral vein is adequate.   3.  Antiemetics: No nausea or vomiting reported at this time.  Compazine is available to him.   4.  Renal function surveillance: Creatinine clearance continues to be at baseline and close to normal range.   5.  Goals of care: Therapy goals remain aggressive and with curative intent given his complete response.  6.  Immune mediated complications: He has not experienced any complication clinic pneumonitis, colitis and thyroid disease.   7.  Follow-up: In 6 weeks for a follow-up.   30  minutes were dedicated to this visit.  The time was spent on updating his disease status, treatment choices and addressing complications noted to cancer and cancer therapy.  Zola Button, MD 12/20/2021 8:06 AM

## 2021-12-21 LAB — T4: T4, Total: 7.6 ug/dL (ref 4.5–12.0)

## 2021-12-23 ENCOUNTER — Telehealth: Payer: Self-pay | Admitting: Oncology

## 2021-12-23 NOTE — Telephone Encounter (Signed)
Per  workque called and spoke to pt about appointment  

## 2021-12-24 ENCOUNTER — Other Ambulatory Visit: Payer: Self-pay

## 2021-12-25 ENCOUNTER — Other Ambulatory Visit: Payer: Self-pay

## 2021-12-26 ENCOUNTER — Other Ambulatory Visit: Payer: Self-pay

## 2021-12-30 ENCOUNTER — Other Ambulatory Visit: Payer: Self-pay

## 2022-01-10 ENCOUNTER — Other Ambulatory Visit: Payer: Self-pay

## 2022-01-12 ENCOUNTER — Other Ambulatory Visit: Payer: Self-pay

## 2022-01-15 ENCOUNTER — Telehealth: Payer: Self-pay | Admitting: Oncology

## 2022-01-15 NOTE — Telephone Encounter (Signed)
Rescheduled 10/27 appointment per provider pal, called to inform patient regarding new rescheduled appointment. Patient is notified.

## 2022-01-30 ENCOUNTER — Other Ambulatory Visit: Payer: 59

## 2022-01-30 ENCOUNTER — Ambulatory Visit: Payer: 59 | Admitting: Oncology

## 2022-01-30 ENCOUNTER — Ambulatory Visit: Payer: 59

## 2022-01-31 ENCOUNTER — Ambulatory Visit: Payer: 59

## 2022-01-31 ENCOUNTER — Other Ambulatory Visit: Payer: 59

## 2022-01-31 ENCOUNTER — Ambulatory Visit: Payer: 59 | Admitting: Oncology

## 2022-02-07 ENCOUNTER — Inpatient Hospital Stay: Payer: 59

## 2022-02-07 ENCOUNTER — Inpatient Hospital Stay: Payer: 59 | Attending: Oncology

## 2022-02-07 ENCOUNTER — Inpatient Hospital Stay (HOSPITAL_BASED_OUTPATIENT_CLINIC_OR_DEPARTMENT_OTHER): Payer: 59 | Admitting: Oncology

## 2022-02-07 VITALS — BP 150/87 | HR 81 | Resp 18 | Wt 258.2 lb

## 2022-02-07 VITALS — BP 143/83 | HR 69 | Temp 98.8°F | Resp 18

## 2022-02-07 DIAGNOSIS — C679 Malignant neoplasm of bladder, unspecified: Secondary | ICD-10-CM

## 2022-02-07 DIAGNOSIS — Z5112 Encounter for antineoplastic immunotherapy: Secondary | ICD-10-CM | POA: Insufficient documentation

## 2022-02-07 DIAGNOSIS — Z79899 Other long term (current) drug therapy: Secondary | ICD-10-CM | POA: Diagnosis not present

## 2022-02-07 DIAGNOSIS — C772 Secondary and unspecified malignant neoplasm of intra-abdominal lymph nodes: Secondary | ICD-10-CM | POA: Diagnosis present

## 2022-02-07 LAB — CBC WITH DIFFERENTIAL (CANCER CENTER ONLY)
Abs Immature Granulocytes: 0.01 10*3/uL (ref 0.00–0.07)
Basophils Absolute: 0.1 10*3/uL (ref 0.0–0.1)
Basophils Relative: 1 %
Eosinophils Absolute: 0.5 10*3/uL (ref 0.0–0.5)
Eosinophils Relative: 7 %
HCT: 44 % (ref 39.0–52.0)
Hemoglobin: 15.2 g/dL (ref 13.0–17.0)
Immature Granulocytes: 0 %
Lymphocytes Relative: 38 %
Lymphs Abs: 2.5 10*3/uL (ref 0.7–4.0)
MCH: 29 pg (ref 26.0–34.0)
MCHC: 34.5 g/dL (ref 30.0–36.0)
MCV: 84 fL (ref 80.0–100.0)
Monocytes Absolute: 0.7 10*3/uL (ref 0.1–1.0)
Monocytes Relative: 10 %
Neutro Abs: 2.9 10*3/uL (ref 1.7–7.7)
Neutrophils Relative %: 44 %
Platelet Count: 146 10*3/uL — ABNORMAL LOW (ref 150–400)
RBC: 5.24 MIL/uL (ref 4.22–5.81)
RDW: 12.3 % (ref 11.5–15.5)
WBC Count: 6.6 10*3/uL (ref 4.0–10.5)
nRBC: 0 % (ref 0.0–0.2)

## 2022-02-07 LAB — CMP (CANCER CENTER ONLY)
ALT: 22 U/L (ref 0–44)
AST: 19 U/L (ref 15–41)
Albumin: 4.5 g/dL (ref 3.5–5.0)
Alkaline Phosphatase: 64 U/L (ref 38–126)
Anion gap: 7 (ref 5–15)
BUN: 21 mg/dL (ref 8–23)
CO2: 29 mmol/L (ref 22–32)
Calcium: 9 mg/dL (ref 8.9–10.3)
Chloride: 102 mmol/L (ref 98–111)
Creatinine: 1.2 mg/dL (ref 0.61–1.24)
GFR, Estimated: >60 mL/min (ref >60)
Glucose, Bld: 124 mg/dL — ABNORMAL HIGH (ref 70–99)
Potassium: 3.8 mmol/L (ref 3.5–5.1)
Sodium: 138 mmol/L (ref 135–145)
Total Bilirubin: 0.5 mg/dL (ref 0.3–1.2)
Total Protein: 7.5 g/dL (ref 6.5–8.1)

## 2022-02-07 LAB — TSH: TSH: 3.122 u[IU]/mL (ref 0.350–4.500)

## 2022-02-07 MED ORDER — SODIUM CHLORIDE 0.9 % IV SOLN: Freq: Once | INTRAVENOUS | Status: AC

## 2022-02-07 MED ORDER — SODIUM CHLORIDE 0.9 % IV SOLN
400.0000 mg | Freq: Once | INTRAVENOUS | Status: AC
  Administered 2022-02-07: 400 mg via INTRAVENOUS
  Filled 2022-02-07: qty 16

## 2022-02-07 NOTE — Patient Instructions (Signed)
[facility]COL[facility]   Discharge Instructions: Thank you for choosing [facility] to provide your oncology and hematology care.   If you have a lab appointment with the Cancer Center, please go directly to the Cancer Center and check in at the registration area.   Wear comfortable clothing and clothing appropriate for easy access to any Portacath or PICC line.   We strive to give you quality time with your provider. You may need to reschedule your appointment if you arrive late (15 or more minutes).  Arriving late affects you and other patients whose appointments are after yours.  Also, if you miss three or more appointments without notifying the office, you may be dismissed from the clinic at the provider's discretion.      For prescription refill requests, have your pharmacy contact our office and allow 72 hours for refills to be completed.    Today you received the following chemotherapy and/or immunotherapy agents: pembrolizumab      To help prevent nausea and vomiting after your treatment, we encourage you to take your nausea medication as directed.  BELOW ARE SYMPTOMS THAT SHOULD BE REPORTED IMMEDIATELY: *FEVER GREATER THAN 100.4 F (38 C) OR HIGHER *CHILLS OR SWEATING *NAUSEA AND VOMITING THAT IS NOT CONTROLLED WITH YOUR NAUSEA MEDICATION *UNUSUAL SHORTNESS OF BREATH *UNUSUAL BRUISING OR BLEEDING *URINARY PROBLEMS (pain or burning when urinating, or frequent urination) *BOWEL PROBLEMS (unusual diarrhea, constipation, pain near the anus) TENDERNESS IN MOUTH AND THROAT WITH OR WITHOUT PRESENCE OF ULCERS (sore throat, sores in mouth, or a toothache) UNUSUAL RASH, SWELLING OR PAIN  UNUSUAL VAGINAL DISCHARGE OR ITCHING   Items with * indicate a potential emergency and should be followed up as soon as possible or go to the Emergency Department if any problems should occur.  Please show the CHEMOTHERAPY ALERT CARD or IMMUNOTHERAPY ALERT CARD at  check-in to the Emergency Department and triage nurse.  Should you have questions after your visit or need to cancel or reschedule your appointment, please contact [facility]  Dept: [phone]  and follow the prompts.  Office hours are 8:00 a.m. to 4:30 p.m. Monday - Friday. Please note that voicemails left after 4:00 p.m. may not be returned until the following business day.  We are closed weekends and major holidays. You have access to a nurse at all times for urgent questions. Please call the main number to the clinic Dept: [phone] and follow the prompts.   For any non-urgent questions, you may also contact your provider using MyChart. We now offer e-Visits for anyone 18 and older to request care online for non-urgent symptoms. For details visit [email or web address].   Also download the MyChart app! Go to the app store, search "MyChart", open the app, select [facility], and log in with your MyChart username and password.  Masks are optional in the cancer centers. If you would like for your care team to wear a mask while they are taking care of you, please let them know. You may have one support person who is at least 61 years old accompany you for your appointments. 

## 2022-02-07 NOTE — Progress Notes (Signed)
Hematology and Oncology Follow Up   Edward [name] [id number] [birth year] 61 y.o. 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  He presented with pelvic adenopathy, PD-L1 score of 100 and has no evidence of residual disease after systemic therapy. Prior Therapy:   He is status post cystoscopy, ureteroscopy as well as a left ureteral stent placement on March 21, 2020.  This was performed by Dr. Louis [name].  Chemotherapy utilizing gemcitabine and cisplatin started on March 27, 2020.  He completed 3 cycles of therapy.  Current therapy: Pembrolizumab 200 mg every 3 weeks.  Cycle 1 started on 06/08/2020.  He has been receiving 400 mg every 6 weeks starting with cycle 2 of therapy.  He is here for the subsequent cycle of treatment.  Interim History: [name] returns today for repeat evaluation.  Since the last visit, he reports no major changes in his health.  He denies any nausea, vomiting or abdominal pain.  He denies any frequency urination.  He denies any hematuria or dysuria.  He denies any pelvic pain or discomfort.  Medications: Reviewed without changes. Current Outpatient Medications  Medication Sig Dispense Refill   Cholecalciferol (VITAMIN D-3 PO) Take by mouth.     ibuprofen (ADVIL) 200 MG tablet Take 800 mg by mouth every 6 (six) hours as needed for headache or moderate pain. (Patient not taking: Reported on 08/02/2021)     Multiple Vitamins-Minerals (ZINC PO) Take by mouth daily. (Patient not taking: Reported on 08/02/2021)     ondansetron (ZOFRAN) 8 MG tablet Take 1 tablet (8 mg total) by mouth every 8 (eight) hours as needed for nausea or vomiting. 20 tablet 1   phenazopyridine (PYRIDIUM) 200 MG tablet Take 1 tablet (200 mg total) by mouth 3 (three) times daily as needed for pain. (Patient not taking: Reported on 08/02/2021) 10 tablet 0    tamsulosin (FLOMAX) 0.4 MG CAPS capsule Take 1 capsule (0.4 mg total) by mouth daily. 30 capsule 11   traMADol (ULTRAM) 50 MG tablet Take 1-2 tablets (50-100 mg total) by mouth every 6 (six) hours as needed for moderate pain. 15 tablet 0   No current facility-administered medications for this visit.     Allergies: No Known Allergies  Physical exam   Blood pressure (!) 150/87, pulse 81, resp. rate 18, weight 258 lb 3 oz (117.1 kg), SpO2 100 %.    ECOG 1    General appearance: Comfortable appearing without any discomfort Head: Normocephalic without any trauma Oropharynx: Mucous membranes are moist and pink without any thrush or ulcers. Eyes: Pupils are equal and round reactive to light. Lymph nodes: No cervical, supraclavicular, inguinal or axillary lymphadenopathy.   Heart:regular rate and rhythm.  S1 and S2 without leg edema. Lung: Clear without any rhonchi or wheezes.  No dullness to percussion. Abdomin: Soft, nontender, nondistended with good bowel sounds.  No hepatosplenomegaly. Musculoskeletal: No joint deformity or effusion.  Full range of motion noted. Neurological: No deficits noted on motor, sensory and deep tendon reflex exam. Skin: No petechial rash or dryness.  Appeared moist.                  Lab Results: Lab Results  Component Value Date   WBC 5.8 12/20/2021   HGB 15.2 12/20/2021   HCT 43.8 12/20/2021   MCV 83.1 12/20/2021   PLT 149 (L) 12/20/2021  Chemistry      Component Value Date/Time   NA 139 12/20/2021 0811   K 3.6 12/20/2021 0811   CL 105 12/20/2021 0811   CO2 26 12/20/2021 0811   BUN 25 (H) 12/20/2021 0811   CREATININE 1.21 12/20/2021 0811   CREATININE 1.21 12/26/2017 1007      Component Value Date/Time   CALCIUM 9.2 12/20/2021 0811   ALKPHOS 59 12/20/2021 0811   AST 22 12/20/2021 0811   ALT 24 12/20/2021 0811   BILITOT 0.5 12/20/2021 0811            Impression and Plan:   61 year old man with:   1.  Bladder  cancer diagnosed in 2021.  He was found to have stage IV high-grade urothelial carcinoma with lymphadenopathy and achieved complete response to therapy.  He is currently on Pembrolizumab which she has tolerated without any major issues.  Risks and benefits of continuing this treatment were discussed at this time.  Complications including autoimmune complications, GI toxicity among others.  Alternative treatment options such as Padcev could be used in the future if he has relapsed disease.  The plan is to update his staging scan in January 2024.   2.  IV access: Peripheral veins are currently in use without any issues.   3.  Antiemetics: Compazine is available to him without any nausea or vomiting.   4.  Renal function surveillance: His creatinine clearance continues to be within normal range.   5.  Goals of care: His disease might not be curable but aggressive measures are warranted given his excellent response and likely long-term disease control.  6.  Immune mediated complications: Complications such as pneumonitis, colitis and thyroid disease were reiterated.   7.  Follow-up: In 6 weeks for repeat follow-up.   30  minutes were spent on this encounter.  The time was dedicated to reviewing laboratory data, disease status update and outlining future plan of care discussion.  Zola Button, MD 02/07/2022 8:38 AM

## 2022-02-08 LAB — T4: T4, Total: 7.4 ug/dL (ref 4.5–12.0)

## 2022-02-11 ENCOUNTER — Other Ambulatory Visit: Payer: Self-pay

## 2022-02-24 ENCOUNTER — Other Ambulatory Visit: Payer: Self-pay

## 2022-03-06 ENCOUNTER — Other Ambulatory Visit: Payer: Self-pay

## 2022-03-25 ENCOUNTER — Inpatient Hospital Stay: Payer: 59

## 2022-03-25 ENCOUNTER — Inpatient Hospital Stay (HOSPITAL_BASED_OUTPATIENT_CLINIC_OR_DEPARTMENT_OTHER): Payer: 59 | Admitting: Oncology

## 2022-03-25 ENCOUNTER — Inpatient Hospital Stay: Payer: 59 | Attending: Oncology

## 2022-03-25 VITALS — BP 140/83 | HR 70 | Resp 18

## 2022-03-25 VITALS — BP 135/86 | HR 72 | Temp 97.6°F | Resp 17 | Ht 72.0 in | Wt 258.8 lb

## 2022-03-25 DIAGNOSIS — C679 Malignant neoplasm of bladder, unspecified: Secondary | ICD-10-CM | POA: Diagnosis not present

## 2022-03-25 DIAGNOSIS — C772 Secondary and unspecified malignant neoplasm of intra-abdominal lymph nodes: Secondary | ICD-10-CM | POA: Diagnosis present

## 2022-03-25 DIAGNOSIS — Z5112 Encounter for antineoplastic immunotherapy: Secondary | ICD-10-CM | POA: Insufficient documentation

## 2022-03-25 LAB — CBC WITH DIFFERENTIAL (CANCER CENTER ONLY)
Abs Immature Granulocytes: 0.01 10*3/uL (ref 0.00–0.07)
Basophils Absolute: 0.1 10*3/uL (ref 0.0–0.1)
Basophils Relative: 1 %
Eosinophils Absolute: 0.6 10*3/uL — ABNORMAL HIGH (ref 0.0–0.5)
Eosinophils Relative: 8 %
HCT: 43.1 % (ref 39.0–52.0)
Hemoglobin: 14.7 g/dL (ref 13.0–17.0)
Immature Granulocytes: 0 %
Lymphocytes Relative: 36 %
Lymphs Abs: 2.5 10*3/uL (ref 0.7–4.0)
MCH: 28.5 pg (ref 26.0–34.0)
MCHC: 34.1 g/dL (ref 30.0–36.0)
MCV: 83.5 fL (ref 80.0–100.0)
Monocytes Absolute: 0.7 10*3/uL (ref 0.1–1.0)
Monocytes Relative: 10 %
Neutro Abs: 3.1 10*3/uL (ref 1.7–7.7)
Neutrophils Relative %: 45 %
Platelet Count: 144 10*3/uL — ABNORMAL LOW (ref 150–400)
RBC: 5.16 MIL/uL (ref 4.22–5.81)
RDW: 12.4 % (ref 11.5–15.5)
WBC Count: 6.9 10*3/uL (ref 4.0–10.5)
nRBC: 0 % (ref 0.0–0.2)

## 2022-03-25 LAB — CMP (CANCER CENTER ONLY)
ALT: 22 U/L (ref 0–44)
AST: 23 U/L (ref 15–41)
Albumin: 4.2 g/dL (ref 3.5–5.0)
Alkaline Phosphatase: 54 U/L (ref 38–126)
Anion gap: 11 (ref 5–15)
BUN: 20 mg/dL (ref 8–23)
CO2: 24 mmol/L (ref 22–32)
Calcium: 9.3 mg/dL (ref 8.9–10.3)
Chloride: 105 mmol/L (ref 98–111)
Creatinine: 1.12 mg/dL (ref 0.61–1.24)
GFR, Estimated: >60 mL/min (ref >60)
Glucose, Bld: 120 mg/dL — ABNORMAL HIGH (ref 70–99)
Potassium: 3.9 mmol/L (ref 3.5–5.1)
Sodium: 140 mmol/L (ref 135–145)
Total Bilirubin: 0.7 mg/dL (ref 0.3–1.2)
Total Protein: 7.4 g/dL (ref 6.5–8.1)

## 2022-03-25 LAB — TSH: TSH: 2.649 u[IU]/mL (ref 0.350–4.500)

## 2022-03-25 MED ORDER — SODIUM CHLORIDE 0.9 % IV SOLN: Freq: Once | INTRAVENOUS | Status: AC

## 2022-03-25 MED ORDER — SODIUM CHLORIDE 0.9 % IV SOLN
400.0000 mg | Freq: Once | INTRAVENOUS | Status: AC
  Administered 2022-03-25: 400 mg via INTRAVENOUS
  Filled 2022-03-25: qty 16

## 2022-03-25 NOTE — Progress Notes (Signed)
Hematology and Oncology Follow Up   Edward [name] [id number] 05/08/1960 61 y.o. 03/25/2022 8:52 AM [name], Jeffrey R, MDShadad, Firas N, MD      Principle Diagnosis: 61-year-old man with bladder cancer diagnosed in December 2021.  He presented with stage IV high-grade urothelial carcinoma, pelvic adenopathy, PD-L1 score of 100.  He achieved a complete response to chemotherapy and receiving maintenance immunotherapy. Prior Therapy:   He is status post cystoscopy, ureteroscopy as well as a left ureteral stent placement on March 21, 2020.  This was performed by Dr. Herrick.  Chemotherapy utilizing gemcitabine and cisplatin started on March 27, 2020.  He completed 3 cycles of therapy.  Current therapy: Pembrolizumab 200 mg every 3 weeks.  Cycle 1 started on 06/08/2020.  He has been receiving 400 mg every 6 weeks starting with cycle 2 of therapy.  He returns for the next cycle with the plan is to complete close to 2 years of immunotherapy.  Interim History: Edward [name] returns today for a repeat evaluation.  Since the last visit, he reports feeling well without any major complaints.  He denies any nausea, vomiting or abdominal pain.  He denies any hospitalizations or illnesses.  He denies any complications related to Pembrolizumab.  He denies excessive fatigue, weakness or pruritus.  Formal status and quality of life remains unchanged.  Medications: Updated on review. Current Outpatient Medications  Medication Sig Dispense Refill   Cholecalciferol (VITAMIN D-3 PO) Take by mouth.     ibuprofen (ADVIL) 200 MG tablet Take 800 mg by mouth every 6 (six) hours as needed for headache or moderate pain. (Patient not taking: Reported on 08/02/2021)     Multiple Vitamins-Minerals (ZINC PO) Take by mouth daily. (Patient not taking: Reported on 08/02/2021)     ondansetron (ZOFRAN) 8 MG tablet Take 1 tablet (8 mg total) by mouth every 8 (eight) hours as needed for nausea or vomiting. 20 tablet 1    phenazopyridine (PYRIDIUM) 200 MG tablet Take 1 tablet (200 mg total) by mouth 3 (three) times daily as needed for pain. (Patient not taking: Reported on 08/02/2021) 10 tablet 0   tamsulosin (FLOMAX) 0.4 MG CAPS capsule Take 1 capsule (0.4 mg total) by mouth daily. 30 capsule 11   traMADol (ULTRAM) 50 MG tablet Take 1-2 tablets (50-100 mg total) by mouth every 6 (six) hours as needed for moderate pain. 15 tablet 0   No current facility-administered medications for this visit.     Allergies: No Known Allergies  Physical exam   Blood pressure 135/86, pulse 72, temperature 97.6 F (36.4 C), temperature source Temporal, resp. rate 17, height 6' (1.829 m), weight 258 lb 12.8 oz (117.4 kg), SpO2 98 %.    ECOG 1    General appearance: Alert, awake without any distress. Head: Atraumatic without abnormalities Oropharynx: Without any thrush or ulcers. Eyes: No scleral icterus. Lymph nodes: No lymphadenopathy noted in the cervical, supraclavicular, or axillary nodes Heart:regular rate and rhythm, without any murmurs or gallops.   Lung: Clear to auscultation without any rhonchi, wheezes or dullness to percussion. Abdomin: Soft, nontender without any shifting dullness or ascites. Musculoskeletal: No clubbing or cyanosis. Neurological: No motor or sensory deficits. Skin: No rashes or lesions.                 Lab Results: Lab Results  Component Value Date   WBC 6.9 03/25/2022   HGB 14.7 03/25/2022   HCT 43.1 03/25/2022   MCV 83.5 03/25/2022   PLT 144 (L) 03/25/2022       Chemistry      Component Value Date/Time   NA 138 02/07/2022 0839   K 3.8 02/07/2022 0839   CL 102 02/07/2022 0839   CO2 29 02/07/2022 0839   BUN 21 02/07/2022 0839   CREATININE 1.20 02/07/2022 0839   CREATININE 1.21 12/26/2017 1007      Component Value Date/Time   CALCIUM 9.0 02/07/2022 0839   ALKPHOS 64 02/07/2022 0839   AST 19 02/07/2022 0839   ALT 22 02/07/2022 0839   BILITOT 0.5 02/07/2022  0839            Impression and Plan:   61 year old man with:   1. Stage IV high-grade urothelial carcinoma with lymphadenopathy arising from the bladder without any evidence of residual disease after complete response to chemotherapy.    Risks and benefits of continuing chemotherapy were discussed at this time.  Plan is to complete 2 years of Pembrolizumab which will end roughly between March and June 2024.  In the meantime, we will continue periodic imaging studies.  Will arrange for a PET scan and January 2024 prior to the next cycle. He had an excellent tolerance to treatment without any complications at this time.   2.  IV access: No issues reported with his peripheral veins at this time.   3.  Antiemetics: No vomiting reported at this time.  Compazine is available to him.   4.  Renal function surveillance: His kidney function close to normal range at this time.  No issues reported after platinum therapy.   5.  Goals of care: Aggressive measures are warranted given his excellent performance status and response.  6.  Immune mediated complications: He has not experienced any complications these include pneumonitis, colitis and thyroid disease.   7.  Follow-up: Will return in 6 weeks for the next cycle of therapy after completing PET scan.   30  minutes were dedicated to this visit.  The time was spent on updating disease status, treatment choices and outlining future plan of care discussion.  Zola Button, MD 03/25/2022 8:52 AM

## 2022-03-25 NOTE — Patient Instructions (Signed)
[facility] MEDICAL ONCOLOGY   Discharge Instructions: Thank you for choosing [facility] to provide your oncology and hematology care.   If you have a lab appointment with the Cancer Center, please go directly to the Cancer Center and check in at the registration area.   Wear comfortable clothing and clothing appropriate for easy access to any Portacath or PICC line.   We strive to give you quality time with your provider. You may need to reschedule your appointment if you arrive late (15 or more minutes).  Arriving late affects you and other patients whose appointments are after yours.  Also, if you miss three or more appointments without notifying the office, you may be dismissed from the clinic at the provider's discretion.      For prescription refill requests, have your pharmacy contact our office and allow 72 hours for refills to be completed.    Today you received the following chemotherapy and/or immunotherapy agents: pembrolizumab      To help prevent nausea and vomiting after your treatment, we encourage you to take your nausea medication as directed.  BELOW ARE SYMPTOMS THAT SHOULD BE REPORTED IMMEDIATELY: *FEVER GREATER THAN 100.4 F (38 C) OR HIGHER *CHILLS OR SWEATING *NAUSEA AND VOMITING THAT IS NOT CONTROLLED WITH YOUR NAUSEA MEDICATION *UNUSUAL SHORTNESS OF BREATH *UNUSUAL BRUISING OR BLEEDING *URINARY PROBLEMS (pain or burning when urinating, or frequent urination) *BOWEL PROBLEMS (unusual diarrhea, constipation, pain near the anus) TENDERNESS IN MOUTH AND THROAT WITH OR WITHOUT PRESENCE OF ULCERS (sore throat, sores in mouth, or a toothache) UNUSUAL RASH, SWELLING OR PAIN  UNUSUAL VAGINAL DISCHARGE OR ITCHING   Items with * indicate a potential emergency and should be followed up as soon as possible or go to the Emergency Department if any problems should occur.  Please show the CHEMOTHERAPY ALERT CARD or IMMUNOTHERAPY ALERT CARD at  check-in to the Emergency Department and triage nurse.  Should you have questions after your visit or need to cancel or reschedule your appointment, please contact [facility]  Dept: [phone]  and follow the prompts.  Office hours are 8:00 a.m. to 4:30 p.m. Monday - Friday. Please note that voicemails left after 4:00 p.m. may not be returned until the following business day.  We are closed weekends and major holidays. You have access to a nurse at all times for urgent questions. Please call the main number to the clinic Dept: [phone] and follow the prompts.   For any non-urgent questions, you may also contact your provider using MyChart. We now offer e-Visits for anyone 18 and older to request care online for non-urgent symptoms. For details visit [email or web address].   Also download the MyChart app! Go to the app store, search "MyChart", open the app, select [facility], and log in with your MyChart username and password.  Masks are optional in the cancer centers. If you would like for your care team to wear a mask while they are taking care of you, please let them know. You may have one support person who is at least 61 years old accompany you for your appointments. 

## 2022-03-25 NOTE — Progress Notes (Signed)
Per Dr. Alen Blew ok to proceed with treatment today without CMP resulting. Prior results normal.

## 2022-03-26 LAB — T4: T4, Total: 7.1 ug/dL (ref 4.5–12.0)

## 2022-04-07 ENCOUNTER — Encounter: Payer: Self-pay | Admitting: Oncology

## 2022-04-11 ENCOUNTER — Other Ambulatory Visit: Payer: Self-pay | Admitting: *Deleted

## 2022-04-11 DIAGNOSIS — C679 Malignant neoplasm of bladder, unspecified: Secondary | ICD-10-CM

## 2022-04-15 ENCOUNTER — Other Ambulatory Visit: Payer: Self-pay

## 2022-05-01 ENCOUNTER — Inpatient Hospital Stay: Payer: 59 | Admitting: Oncology

## 2022-05-01 ENCOUNTER — Inpatient Hospital Stay: Payer: 59

## 2022-05-05 ENCOUNTER — Inpatient Hospital Stay: Payer: 59 | Attending: Oncology

## 2022-05-05 ENCOUNTER — Inpatient Hospital Stay (HOSPITAL_BASED_OUTPATIENT_CLINIC_OR_DEPARTMENT_OTHER): Payer: 59 | Admitting: Oncology

## 2022-05-05 ENCOUNTER — Encounter: Payer: Self-pay | Admitting: Oncology

## 2022-05-05 VITALS — BP 158/90 | HR 74 | Temp 97.9°F | Resp 18 | Ht 72.0 in | Wt 254.2 lb

## 2022-05-05 DIAGNOSIS — Z5112 Encounter for antineoplastic immunotherapy: Secondary | ICD-10-CM | POA: Diagnosis not present

## 2022-05-05 DIAGNOSIS — C772 Secondary and unspecified malignant neoplasm of intra-abdominal lymph nodes: Secondary | ICD-10-CM | POA: Insufficient documentation

## 2022-05-05 DIAGNOSIS — Z79899 Other long term (current) drug therapy: Secondary | ICD-10-CM | POA: Diagnosis not present

## 2022-05-05 DIAGNOSIS — C679 Malignant neoplasm of bladder, unspecified: Secondary | ICD-10-CM

## 2022-05-05 DIAGNOSIS — D696 Thrombocytopenia, unspecified: Secondary | ICD-10-CM | POA: Diagnosis not present

## 2022-05-05 LAB — CMP (CANCER CENTER ONLY)
ALT: 21 U/L (ref 0–44)
AST: 20 U/L (ref 15–41)
Albumin: 4.6 g/dL (ref 3.5–5.0)
Alkaline Phosphatase: 53 U/L (ref 38–126)
Anion gap: 9 (ref 5–15)
BUN: 17 mg/dL (ref 8–23)
CO2: 27 mmol/L (ref 22–32)
Calcium: 10 mg/dL (ref 8.9–10.3)
Chloride: 104 mmol/L (ref 98–111)
Creatinine: 1.22 mg/dL (ref 0.61–1.24)
GFR, Estimated: >60 mL/min (ref >60)
Glucose, Bld: 107 mg/dL — ABNORMAL HIGH (ref 70–99)
Potassium: 3.6 mmol/L (ref 3.5–5.1)
Sodium: 140 mmol/L (ref 135–145)
Total Bilirubin: 0.8 mg/dL (ref 0.3–1.2)
Total Protein: 7.7 g/dL (ref 6.5–8.1)

## 2022-05-05 LAB — CBC WITH DIFFERENTIAL (CANCER CENTER ONLY)
Abs Immature Granulocytes: 0.01 10*3/uL (ref 0.00–0.07)
Basophils Absolute: 0.1 10*3/uL (ref 0.0–0.1)
Basophils Relative: 1 %
Eosinophils Absolute: 0.2 10*3/uL (ref 0.0–0.5)
Eosinophils Relative: 4 %
HCT: 44.3 % (ref 39.0–52.0)
Hemoglobin: 14.9 g/dL (ref 13.0–17.0)
Immature Granulocytes: 0 %
Lymphocytes Relative: 35 %
Lymphs Abs: 1.8 10*3/uL (ref 0.7–4.0)
MCH: 27.9 pg (ref 26.0–34.0)
MCHC: 33.6 g/dL (ref 30.0–36.0)
MCV: 83 fL (ref 80.0–100.0)
Monocytes Absolute: 0.5 10*3/uL (ref 0.1–1.0)
Monocytes Relative: 10 %
Neutro Abs: 2.7 10*3/uL (ref 1.7–7.7)
Neutrophils Relative %: 50 %
Platelet Count: 136 10*3/uL — ABNORMAL LOW (ref 150–400)
RBC: 5.34 MIL/uL (ref 4.22–5.81)
RDW: 12.6 % (ref 11.5–15.5)
WBC Count: 5.3 10*3/uL (ref 4.0–10.5)
nRBC: 0 % (ref 0.0–0.2)

## 2022-05-05 LAB — TSH: TSH: 1.379 u[IU]/mL (ref 0.350–4.500)

## 2022-05-05 NOTE — Progress Notes (Signed)
[name] OFFICE PROGRESS NOTE   Diagnosis: Bladder cancer  INTERVAL HISTORY:   Edward [name] presented with left lower extremity weakness and swelling in November 2021.  A CT abdomen/pelvis revealed mild left hydronephrosis, mild right hydronephrosis, irregular thickening of the left bladder wall, and narrowing at the left ureterovesical junction.  He underwent a cystoscopy by Dr. Louis Meckel.  No mucosal tumor was seen, but there was thickening and a trabeculated bladder.  A CT guided biopsy of a left retroperitoneal lymph node on 03/05/2020 revealed a poorly differentiated carcinoma positive for cytokeratin 7, cytokeratin 20, GATA3, p40, and CDX2.  The immunohistochemical profile is consistent with a urothelial carcinoma.  The PD-L1 score returned at 100.  He was referred to Dr. Alen Blew he was treated with 3 cycles of gemcitabine/cisplatin beginning 03/27/2020.  He reports tolerating the chemotherapy well.  No neuropathy symptoms.  Staging CTs 06/27/2020 revealed a marked interval response to therapy with mildly enlarged lymph nodes in the retroperitoneum and pelvis bulky nodes seen on the prior study.  Stranding at the base of the penis and suprapubic soft tissue was stable.  There was persistent dilation of the left renal pelvis despite presence of a stent.  He began treatment with pembrolizumab 06/08/2020 and continues pembrolizumab, currently on a 6-week schedule.  He underwent surveillance CT 09/18/2021.  There is no evidence of metastatic or recurrent disease in the chest, abdomen, or pelvis.  Is a stable 10 mm right pelvic sidewall node.  Mild stranding about the penis is of uncertain significance.  A staging PET scan was ordered for this month, but was denied by his insurance company.  Edward [name] reports feeling well.  No diarrhea or rash.  There is stable mild edema in the suprapubic area, scrotum, and left upper thigh.\    Past medical history:  None  Past surgical  history: Cystectomy in his 31s  Social history: He lives with his wife in [place].  He is a Industrial/product designer.  He does not use cigarettes or alcohol.  No transfusion history.  No risk factors for HIV or hepatitis.  View of systems: Positives-suprapubic and left groin swelling A complete review of systems is otherwise negative Objective:  Vital signs in last 24 hours:  Blood pressure (!) 158/90, pulse 74, temperature 97.9 F (36.6 C), temperature source Oral, resp. rate 18, height 6' (1.829 m), weight 254 lb 3.2 oz (115.3 kg), SpO2 99 %.    HEENT: Oropharynx without visible mass, neck without mass Lymphatics: No cervical, supraclavicular, axillary, or inguinal nodes Resp: Lungs clear bilaterally Cardio: Regular rate and rhythm GI: No hepatosplenomegaly, no mass, nontender Vascular: At the left upper thigh Neuro: Alert and oriented, the motor exam appears intact in the upper and lower extremities bilaterally Skin: Mild suprapubic and scrotal edema GU: Testes without mass  Lab Results:  Lab Results  Component Value Date   WBC 5.3 05/05/2022   HGB 14.9 05/05/2022   HCT 44.3 05/05/2022   MCV 83.0 05/05/2022   PLT 136 (L) 05/05/2022   NEUTROABS 2.7 05/05/2022    CMP  Lab Results  Component Value Date   NA 140 05/05/2022   K 3.6 05/05/2022   CL 104 05/05/2022   CO2 27 05/05/2022   GLUCOSE 107 (H) 05/05/2022   BUN 17 05/05/2022   CREATININE 1.22 05/05/2022   CALCIUM 10.0 05/05/2022   PROT 7.7 05/05/2022   ALBUMIN 4.6 05/05/2022   AST 20 05/05/2022   ALT 21 05/05/2022   ALKPHOS 53 05/05/2022  BILITOT 0.8 05/05/2022   GFRNONAA >60 05/05/2022      Imaging:  As per HPI Medications: I have reviewed the patient's current medications.   Assessment/Plan: Metastatic high-grade urothelial carcinoma Presenting with left leg swelling and weakness November 2021 CT abdomen/pelvis 02/17/2020-left hydronephrosis, retroperitoneal adenopathy, scrotal thickening and  edema CT-guided biopsy of left retroperitoneal lymphadenopathy 03/05/2020-poorly differentiated carcinoma consistent with a urothelial primary Left trigone biopsy 03/21/2020-small foci of infiltrative high-grade urothelial carcinoma with invasion of the muscularis propria and lymphovascular invasion Renal pelvis and left upper tract washing 03/21/2020-high-grade urothelial carcinoma PET 03/21/2020-extensive bilateral retroperitoneal, pelvic, and inguinal hypermetabolic adenopathy, hypermetabolism in the antral region of the stomach with no obvious mass on CT, no evidence of metastatic disease in the neck, chest, or bones Gemcitabine/cisplatin for 3 cycles beginning 03/27/2020 CT 06/27/2020-marked interval response with some mildly enlarged lymph nodes in the retroperitoneum and pelvis in the range of 1 cm, stranding extending into the base of the penis and suprapubic soft tissue-similar Pembrolizumab beginning 06/08/2020, changed to every 6-week dosing beginning with cycle 2 CTs 09/18/2021-no evidence of metastatic or recurrent disease in the chest, abdomen, or pelvis, stable 10 mm right pelvic sidewall node mild stranding about the base of the penis  2.  Status post cholecystectomy 3.  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  He presented with bulky pelvic/retroperitoneal lymphadenopathy.  He entered clinical remission after 3 cycles of gemcitabine/cisplatin chemotherapy.  He has been maintained on pembrolizumab since March 2022.  The plan per Dr. Alen Blew is to complete 2 years of pembrolizumab therapy.  He will complete another cycle of pembrolizumab tomorrow.  He will return for an office visit with the plan to complete another cycle of pembrolizumab in 6 weeks.  He is due for surveillance imaging.  Dr. Alen Blew ordered a PET for this month.  We will reorder this  study.  Betsy Coder, MD  05/05/2022  4:43 PM

## 2022-05-06 ENCOUNTER — Other Ambulatory Visit: Payer: Self-pay

## 2022-05-06 ENCOUNTER — Inpatient Hospital Stay: Payer: 59

## 2022-05-06 VITALS — BP 134/80 | HR 60 | Temp 97.7°F | Resp 18

## 2022-05-06 DIAGNOSIS — C679 Malignant neoplasm of bladder, unspecified: Secondary | ICD-10-CM

## 2022-05-06 DIAGNOSIS — Z5112 Encounter for antineoplastic immunotherapy: Secondary | ICD-10-CM | POA: Diagnosis not present

## 2022-05-06 MED ORDER — SODIUM CHLORIDE 0.9 % IV SOLN
400.0000 mg | Freq: Once | INTRAVENOUS | Status: AC
  Administered 2022-05-06: 400 mg via INTRAVENOUS
  Filled 2022-05-06: qty 16

## 2022-05-06 MED ORDER — SODIUM CHLORIDE 0.9 % IV SOLN: Freq: Once | INTRAVENOUS | Status: AC

## 2022-05-06 NOTE — Patient Instructions (Signed)
[facility]   Discharge Instructions: Thank you for choosing [facility] to provide your oncology and hematology care.   If you have a lab appointment with the [facility], please go directly to the [facility] and check in at the registration area.   Wear comfortable clothing and clothing appropriate for easy access to any Portacath or PICC line.   We strive to give you quality time with your provider. You may need to reschedule your appointment if you arrive late (15 or more minutes).  Arriving late affects you and other patients whose appointments are after yours.  Also, if you miss three or more appointments without notifying the office, you may be dismissed from the clinic at the provider's discretion.      For prescription refill requests, have your pharmacy contact our office and allow 72 hours for refills to be completed.    Today you received the following chemotherapy and/or immunotherapy agents Pembrolizumab (KEYTRUDA).      To help prevent nausea and vomiting after your treatment, we encourage you to take your nausea medication as directed.  BELOW ARE SYMPTOMS THAT SHOULD BE REPORTED IMMEDIATELY: *FEVER GREATER THAN 100.4 F (38 C) OR HIGHER *CHILLS OR SWEATING *NAUSEA AND VOMITING THAT IS NOT CONTROLLED WITH YOUR NAUSEA MEDICATION *UNUSUAL SHORTNESS OF BREATH *UNUSUAL BRUISING OR BLEEDING *URINARY PROBLEMS (pain or burning when urinating, or frequent urination) *BOWEL PROBLEMS (unusual diarrhea, constipation, pain near the anus) TENDERNESS IN MOUTH AND THROAT WITH OR WITHOUT PRESENCE OF ULCERS (sore throat, sores in mouth, or a toothache) UNUSUAL RASH, SWELLING OR PAIN  UNUSUAL VAGINAL DISCHARGE OR ITCHING   Items with * indicate a potential emergency and should be followed up as soon as possible or go to the Emergency Department if any problems should occur.  Please show the CHEMOTHERAPY ALERT CARD or IMMUNOTHERAPY  ALERT CARD at check-in to the Emergency Department and triage nurse.  Should you have questions after your visit or need to cancel or reschedule your appointment, please contact [facility]  Dept: [phone]  and follow the prompts.  Office hours are 8:00 a.m. to 4:30 p.m. Monday - Friday. Please note that voicemails left after 4:00 p.m. may not be returned until the following business day.  We are closed weekends and major holidays. You have access to a nurse at all times for urgent questions. Please call the main number to the clinic Dept: [phone] and follow the prompts.   For any non-urgent questions, you may also contact your provider using MyChart. We now offer e-Visits for anyone 73 and older to request care online for non-urgent symptoms. For details visit [email or web address].   Also download the MyChart app! Go to the app store, search "MyChart", open the app, select [facility], and log in with your MyChart username and password.  Pembrolizumab Injection What is this medication? PEMBROLIZUMAB (PEM broe LIZ ue mab) treats some types of cancer. It works by helping your immune system slow or stop the spread of cancer cells. It is a monoclonal antibody. This medicine may be used for other purposes; ask your health care provider or pharmacist if you have questions. COMMON BRAND NAME(S): Keytruda What should I tell my care team before I take this medication? They need to know if you have any of these conditions: Allogeneic stem cell transplant (uses someone else's stem cells) Autoimmune diseases, such as Crohn disease, ulcerative colitis, lupus History of chest radiation Nervous  system problems, such as Guillain-Barre syndrome, myasthenia gravis Organ transplant An unusual or allergic reaction to pembrolizumab, other medications, foods, dyes, or preservatives Pregnant or trying to get pregnant Breast-feeding How should I use this  medication? This medication is injected into a vein. It is given by your care team in a hospital or clinic setting. A special MedGuide will be given to you before each treatment. Be sure to read this information carefully each time. Talk to your care team about the use of this medication in children. While it may be prescribed for children as young as 6 months for selected conditions, precautions do apply. Overdosage: If you think you have taken too much of this medicine contact a poison control center or emergency room at once. NOTE: This medicine is only for you. Do not share this medicine with others. What if I miss a dose? Keep appointments for follow-up doses. It is important not to miss your dose. Call your care team if you are unable to keep an appointment. What may interact with this medication? Interactions have not been studied. This list may not describe all possible interactions. Give your health care provider a list of all the medicines, herbs, non-prescription drugs, or dietary supplements you use. Also tell them if you smoke, drink alcohol, or use illegal drugs. Some items may interact with your medicine. What should I watch for while using this medication? Your condition will be monitored carefully while you are receiving this medication. You may need blood work while taking this medication. This medication may cause serious skin reactions. They can happen weeks to months after starting the medication. Contact your care team right away if you notice fevers or flu-like symptoms with a rash. The rash may be red or purple and then turn into blisters or peeling of the skin. You may also notice a red rash with swelling of the face, lips, or lymph nodes in your neck or under your arms. Tell your care team right away if you have any change in your eyesight. Talk to your care team if you may be pregnant. Serious birth defects can occur if you take this medication during pregnancy and for 4  months after the last dose. You will need a negative pregnancy test before starting this medication. Contraception is recommended while taking this medication and for 4 months after the last dose. Your care team can help you find the option that works for you. Do not breastfeed while taking this medication and for 4 months after the last dose. What side effects may I notice from receiving this medication? Side effects that you should report to your care team as soon as possible: Allergic reactions--skin rash, itching, hives, swelling of the face, lips, tongue, or throat Dry cough, shortness of breath or trouble breathing Eye pain, redness, irritation, or discharge with blurry or decreased vision Heart muscle inflammation--unusual weakness or fatigue, shortness of breath, chest pain, fast or irregular heartbeat, dizziness, swelling of the ankles, feet, or hands Hormone gland problems--headache, sensitivity to light, unusual weakness or fatigue, dizziness, fast or irregular heartbeat, increased sensitivity to cold or heat, excessive sweating, constipation, hair loss, increased thirst or amount of urine, tremors or shaking, irritability Infusion reactions--chest pain, shortness of breath or trouble breathing, feeling faint or lightheaded Kidney injury (glomerulonephritis)--decrease in the amount of urine, red or dark brown urine, foamy or bubbly urine, swelling of the ankles, hands, or feet Liver injury--right upper belly pain, loss of appetite, nausea, light-colored stool, dark yellow or  brown urine, yellowing skin or eyes, unusual weakness or fatigue Pain, tingling, or numbness in the hands or feet, muscle weakness, change in vision, confusion or trouble speaking, loss of balance or coordination, trouble walking, seizures Rash, fever, and swollen lymph nodes Redness, blistering, peeling, or loosening of the skin, including inside the mouth Sudden or severe stomach pain, bloody diarrhea, fever, nausea,  vomiting Side effects that usually do not require medical attention (report to your care team if they continue or are bothersome): Bone, joint, or muscle pain Diarrhea Fatigue Loss of appetite Nausea Skin rash This list may not describe all possible side effects. Call your doctor for medical advice about side effects. You may report side effects to FDA at 1-800-FDA-1088. Where should I keep my medication? This medication is given in a hospital or clinic. It will not be stored at home. NOTE: This sheet is a summary. It may not cover all possible information. If you have questions about this medicine, talk to your doctor, pharmacist, or health care provider.  2023 Elsevier/Gold Standard (2012-12-13 00:00:00)  

## 2022-05-12 ENCOUNTER — Other Ambulatory Visit: Payer: Self-pay | Admitting: Oncology

## 2022-05-15 ENCOUNTER — Inpatient Hospital Stay: Payer: 59 | Attending: Oncology

## 2022-05-15 NOTE — Progress Notes (Signed)
[facility] Work  Clinical Social Work was referred by new patient protocol for assessment of psychosocial needs.  Clinical Social Worker contacted patient by phone  to offer support and assess for needs.    Patient's primary concern was if his insurance would pay for his upcoming scan.  He stated when he was a patient of Dr. Hazeline Junker, insurance denied two scans.  Advised patient to discuss with the Nurse Navigator, Royston Sinner.  CSW to follow up with patient to assess any other psychosocial issues.  He expressed no other needs at this time.     Margaree Mackintosh, LCSW  Clinical Social Worker [facility]

## 2022-05-26 ENCOUNTER — Other Ambulatory Visit: Payer: Self-pay

## 2022-05-30 ENCOUNTER — Ambulatory Visit (HOSPITAL_COMMUNITY)
Admission: RE | Admit: 2022-05-30 | Discharge: 2022-05-30 | Disposition: A | Discharge status: Home / Self Care | Payer: 59 | Source: Ambulatory Visit | Attending: Oncology | Admitting: Oncology

## 2022-05-30 DIAGNOSIS — C679 Malignant neoplasm of bladder, unspecified: Secondary | ICD-10-CM | POA: Insufficient documentation

## 2022-05-30 LAB — GLUCOSE, CAPILLARY: Glucose-Capillary: 119 mg/dL — ABNORMAL HIGH (ref 70–99)

## 2022-05-30 MED ORDER — FLUDEOXYGLUCOSE F - 18 (FDG) INJECTION
12.6000 | Freq: Once | INTRAVENOUS | Status: AC
  Administered 2022-05-30: 12.8 via INTRAVENOUS

## 2022-06-13 ENCOUNTER — Inpatient Hospital Stay: Payer: 59 | Attending: Oncology

## 2022-06-13 ENCOUNTER — Inpatient Hospital Stay (HOSPITAL_BASED_OUTPATIENT_CLINIC_OR_DEPARTMENT_OTHER): Payer: 59 | Admitting: Oncology

## 2022-06-13 ENCOUNTER — Encounter: Payer: Self-pay | Admitting: *Deleted

## 2022-06-13 ENCOUNTER — Inpatient Hospital Stay: Payer: 59

## 2022-06-13 VITALS — BP 120/85 | HR 61 | Resp 20

## 2022-06-13 VITALS — BP 146/79 | HR 72 | Temp 98.2°F | Resp 18 | Ht 72.0 in | Wt 247.0 lb

## 2022-06-13 DIAGNOSIS — Z5112 Encounter for antineoplastic immunotherapy: Secondary | ICD-10-CM | POA: Diagnosis present

## 2022-06-13 DIAGNOSIS — C679 Malignant neoplasm of bladder, unspecified: Secondary | ICD-10-CM

## 2022-06-13 DIAGNOSIS — C772 Secondary and unspecified malignant neoplasm of intra-abdominal lymph nodes: Secondary | ICD-10-CM | POA: Diagnosis present

## 2022-06-13 DIAGNOSIS — Z79899 Other long term (current) drug therapy: Secondary | ICD-10-CM | POA: Insufficient documentation

## 2022-06-13 DIAGNOSIS — D696 Thrombocytopenia, unspecified: Secondary | ICD-10-CM | POA: Diagnosis not present

## 2022-06-13 LAB — CMP (CANCER CENTER ONLY)
ALT: 17 U/L (ref 0–44)
AST: 20 U/L (ref 15–41)
Albumin: 4.5 g/dL (ref 3.5–5.0)
Alkaline Phosphatase: 55 U/L (ref 38–126)
Anion gap: 10 (ref 5–15)
BUN: 26 mg/dL — ABNORMAL HIGH (ref 8–23)
CO2: 25 mmol/L (ref 22–32)
Calcium: 9.5 mg/dL (ref 8.9–10.3)
Chloride: 104 mmol/L (ref 98–111)
Creatinine: 1.3 mg/dL — ABNORMAL HIGH (ref 0.61–1.24)
GFR, Estimated: >60 mL/min (ref >60)
Glucose, Bld: 137 mg/dL — ABNORMAL HIGH (ref 70–99)
Potassium: 3.8 mmol/L (ref 3.5–5.1)
Sodium: 139 mmol/L (ref 135–145)
Total Bilirubin: 0.5 mg/dL (ref 0.3–1.2)
Total Protein: 7.3 g/dL (ref 6.5–8.1)

## 2022-06-13 LAB — CBC WITH DIFFERENTIAL (CANCER CENTER ONLY)
Abs Immature Granulocytes: 0.01 10*3/uL (ref 0.00–0.07)
Basophils Absolute: 0.1 10*3/uL (ref 0.0–0.1)
Basophils Relative: 1 %
Eosinophils Absolute: 0.6 10*3/uL — ABNORMAL HIGH (ref 0.0–0.5)
Eosinophils Relative: 9 %
HCT: 42.8 % (ref 39.0–52.0)
Hemoglobin: 14.5 g/dL (ref 13.0–17.0)
Immature Granulocytes: 0 %
Lymphocytes Relative: 41 %
Lymphs Abs: 2.5 10*3/uL (ref 0.7–4.0)
MCH: 28.5 pg (ref 26.0–34.0)
MCHC: 33.9 g/dL (ref 30.0–36.0)
MCV: 84.3 fL (ref 80.0–100.0)
Monocytes Absolute: 0.6 10*3/uL (ref 0.1–1.0)
Monocytes Relative: 10 %
Neutro Abs: 2.4 10*3/uL (ref 1.7–7.7)
Neutrophils Relative %: 39 %
Platelet Count: 128 10*3/uL — ABNORMAL LOW (ref 150–400)
RBC: 5.08 MIL/uL (ref 4.22–5.81)
RDW: 12.6 % (ref 11.5–15.5)
WBC Count: 6.2 10*3/uL (ref 4.0–10.5)
nRBC: 0 % (ref 0.0–0.2)

## 2022-06-13 LAB — TSH: TSH: 3.37 u[IU]/mL (ref 0.350–4.500)

## 2022-06-13 MED ORDER — SODIUM CHLORIDE 0.9 % IV SOLN
400.0000 mg | Freq: Once | INTRAVENOUS | Status: AC
  Administered 2022-06-13: 400 mg via INTRAVENOUS
  Filled 2022-06-13: qty 16

## 2022-06-13 MED ORDER — SODIUM CHLORIDE 0.9 % IV SOLN: Freq: Once | INTRAVENOUS | Status: AC

## 2022-06-13 NOTE — Progress Notes (Signed)
Patient seen by Dr. Sherrill today  Vitals are within treatment parameters.No intervention for BP 140/90   Labs reviewed by Dr. Sherrill and are within treatment parameters.  Per physician team, patient is ready for treatment and there are NO modifications to the treatment plan.  

## 2022-06-13 NOTE — Progress Notes (Signed)
  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  He feels well.  He reports intentional weight loss by dieting.  No hematuria.  Edema in the suprapubic area has improved.  No new complaint.  No rash or diarrhea.  Objective:  Vital signs in last 24 hours:  Blood pressure (!) 146/79, pulse 72, temperature 98.2 F (36.8 C), temperature source Oral, resp. rate 18, height 6' (1.829 m), weight 247 lb (112 kg), SpO2 100 %.    Lymphatics: No cervical, supraclavicular, axillary, or inguinal nodes Resp: Lungs clear bilaterally Cardio: Regular rate and rhythm GI: No hepatosplenomegaly, no mass, nontender, mild soft tissue edema in the suprapubic region Vascular: No leg edema  Lab Results:  Lab Results  Component Value Date   WBC 6.2 06/13/2022   HGB 14.5 06/13/2022   HCT 42.8 06/13/2022   MCV 84.3 06/13/2022   PLT 128 (L) 06/13/2022   NEUTROABS 2.4 06/13/2022    CMP  Lab Results  Component Value Date   NA 139 06/13/2022   K 3.8 06/13/2022   CL 104 06/13/2022   CO2 25 06/13/2022   GLUCOSE 137 (H) 06/13/2022   BUN 26 (H) 06/13/2022   CREATININE 1.30 (H) 06/13/2022   CALCIUM 9.5 06/13/2022   PROT 7.3 06/13/2022   ALBUMIN 4.5 06/13/2022   AST 20 06/13/2022   ALT 17 06/13/2022   ALKPHOS 55 06/13/2022   BILITOT 0.5 06/13/2022   GFRNONAA >60 06/13/2022     Medications: I have reviewed the patient's current medications.   Assessment/Plan: Metastatic high-grade urothelial carcinoma Presenting with left leg swelling and weakness November 2021 CT abdomen/pelvis 02/17/2020-left hydronephrosis, retroperitoneal adenopathy, scrotal thickening and edema CT-guided biopsy of left retroperitoneal lymphadenopathy 03/05/2020-poorly differentiated carcinoma consistent with a urothelial primary Left trigone biopsy 03/21/2020-small foci of infiltrative high-grade urothelial carcinoma with invasion of the  muscularis propria and lymphovascular invasion Renal pelvis and left upper tract washing 03/21/2020-high-grade urothelial carcinoma PET 03/21/2020-extensive bilateral retroperitoneal, pelvic, and inguinal hypermetabolic adenopathy, hypermetabolism in the antral region of the stomach with no obvious mass on CT, no evidence of metastatic disease in the neck, chest, or bones Gemcitabine/cisplatin for 3 cycles beginning 03/27/2020 CT 06/27/2020-marked interval response with some mildly enlarged lymph nodes in the retroperitoneum and pelvis in the range of 1 cm, stranding extending into the base of the penis and suprapubic soft tissue-similar Pembrolizumab beginning 06/08/2020, changed to every 6-week dosing beginning with cycle 2 CTs 09/18/2021-no evidence of metastatic or recurrent disease in the chest, abdomen, or pelvis, stable 10 mm right pelvic sidewall node mild stranding about the base of the penis PET 05/30/2022-stable compared to 10/18/2021, no evidence of metastatic disease, right pelvic sidewall node described on the 2023 CT is not hypermetabolic  2.  Status post cholecystectomy 3.  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  He is in clinical remission from bladder cancer.  He would like to continue pembrolizumab.  We discussed the general recommendation to complete 2 years of pembrolizumab.  He understands the potential for toxicity with pembrolizumab.  He would like to continue treatment.  He will complete another cycle of pembrolizumab today.  He will return for an office visit and pembrolizumab in 6 weeks.  We will plan for a restaging evaluation in 6 months. Betsy Coder, MD  06/13/2022  8:51 AM

## 2022-06-13 NOTE — Patient Instructions (Signed)
[facility]   Discharge Instructions: Thank you for choosing [facility] to provide your oncology and hematology care.   If you have a lab appointment with the Cancer Center, please go directly to the Cancer Center and check in at the registration area.   Wear comfortable clothing and clothing appropriate for easy access to any Portacath or PICC line.   We strive to give you quality time with your provider. You may need to reschedule your appointment if you arrive late (15 or more minutes).  Arriving late affects you and other patients whose appointments are after yours.  Also, if you miss three or more appointments without notifying the office, you may be dismissed from the clinic at the provider's discretion.      For prescription refill requests, have your pharmacy contact our office and allow 72 hours for refills to be completed.    Today you received the following chemotherapy and/or immunotherapy agents Pembrolizumab (KEYTRUDA).      To help prevent nausea and vomiting after your treatment, we encourage you to take your nausea medication as directed.  BELOW ARE SYMPTOMS THAT SHOULD BE REPORTED IMMEDIATELY: *FEVER GREATER THAN 100.4 F (38 C) OR HIGHER *CHILLS OR SWEATING *NAUSEA AND VOMITING THAT IS NOT CONTROLLED WITH YOUR NAUSEA MEDICATION *UNUSUAL SHORTNESS OF BREATH *UNUSUAL BRUISING OR BLEEDING *URINARY PROBLEMS (pain or burning when urinating, or frequent urination) *BOWEL PROBLEMS (unusual diarrhea, constipation, pain near the anus) TENDERNESS IN MOUTH AND THROAT WITH OR WITHOUT PRESENCE OF ULCERS (sore throat, sores in mouth, or a toothache) UNUSUAL RASH, SWELLING OR PAIN  UNUSUAL VAGINAL DISCHARGE OR ITCHING   Items with * indicate a potential emergency and should be followed up as soon as possible or go to the Emergency Department if any problems should occur.  Please show the CHEMOTHERAPY ALERT CARD or IMMUNOTHERAPY  ALERT CARD at check-in to the Emergency Department and triage nurse.  Should you have questions after your visit or need to cancel or reschedule your appointment, please contact [facility]  Dept: [phone]  and follow the prompts.  Office hours are 8:00 a.m. to 4:30 p.m. Monday - Friday. Please note that voicemails left after 4:00 p.m. may not be returned until the following business day.  We are closed weekends and major holidays. You have access to a nurse at all times for urgent questions. Please call the main number to the clinic Dept: [phone] and follow the prompts.   For any non-urgent questions, you may also contact your provider using MyChart. We now offer e-Visits for anyone 18 and older to request care online for non-urgent symptoms. For details visit [email or web address].   Also download the MyChart app! Go to the app store, search "MyChart", open the app, select [place], and log in with your MyChart username and password.  Pembrolizumab Injection What is this medication? PEMBROLIZUMAB (PEM broe LIZ ue mab) treats some types of cancer. It works by helping your immune system slow or stop the spread of cancer cells. It is a monoclonal antibody. This medicine may be used for other purposes; ask your health care provider or pharmacist if you have questions. COMMON BRAND NAME(S): Keytruda What should I tell my care team before I take this medication? They need to know if you have any of these conditions: Allogeneic stem cell transplant (uses someone else's stem cells) Autoimmune diseases, such as Crohn disease, ulcerative colitis, lupus History of chest radiation Nervous   system problems, such as Guillain-Barre syndrome, myasthenia gravis Organ transplant An unusual or allergic reaction to pembrolizumab, other medications, foods, dyes, or preservatives Pregnant or trying to get pregnant Breast-feeding How should I use this  medication? This medication is injected into a vein. It is given by your care team in a hospital or clinic setting. A special MedGuide will be given to you before each treatment. Be sure to read this information carefully each time. Talk to your care team about the use of this medication in children. While it may be prescribed for children as young as 6 months for selected conditions, precautions do apply. Overdosage: If you think you have taken too much of this medicine contact a poison control center or emergency room at once. NOTE: This medicine is only for you. Do not share this medicine with others. What if I miss a dose? Keep appointments for follow-up doses. It is important not to miss your dose. Call your care team if you are unable to keep an appointment. What may interact with this medication? Interactions have not been studied. This list may not describe all possible interactions. Give your health care provider a list of all the medicines, herbs, non-prescription drugs, or dietary supplements you use. Also tell them if you smoke, drink alcohol, or use illegal drugs. Some items may interact with your medicine. What should I watch for while using this medication? Your condition will be monitored carefully while you are receiving this medication. You may need blood work while taking this medication. This medication may cause serious skin reactions. They can happen weeks to months after starting the medication. Contact your care team right away if you notice fevers or flu-like symptoms with a rash. The rash may be red or purple and then turn into blisters or peeling of the skin. You may also notice a red rash with swelling of the face, lips, or lymph nodes in your neck or under your arms. Tell your care team right away if you have any change in your eyesight. Talk to your care team if you may be pregnant. Serious birth defects can occur if you take this medication during pregnancy and for 4  months after the last dose. You will need a negative pregnancy test before starting this medication. Contraception is recommended while taking this medication and for 4 months after the last dose. Your care team can help you find the option that works for you. Do not breastfeed while taking this medication and for 4 months after the last dose. What side effects may I notice from receiving this medication? Side effects that you should report to your care team as soon as possible: Allergic reactions--skin rash, itching, hives, swelling of the face, lips, tongue, or throat Dry cough, shortness of breath or trouble breathing Eye pain, redness, irritation, or discharge with blurry or decreased vision Heart muscle inflammation--unusual weakness or fatigue, shortness of breath, chest pain, fast or irregular heartbeat, dizziness, swelling of the ankles, feet, or hands Hormone gland problems--headache, sensitivity to light, unusual weakness or fatigue, dizziness, fast or irregular heartbeat, increased sensitivity to cold or heat, excessive sweating, constipation, hair loss, increased thirst or amount of urine, tremors or shaking, irritability Infusion reactions--chest pain, shortness of breath or trouble breathing, feeling faint or lightheaded Kidney injury (glomerulonephritis)--decrease in the amount of urine, red or dark brown urine, foamy or bubbly urine, swelling of the ankles, hands, or feet Liver injury--right upper belly pain, loss of appetite, nausea, light-colored stool, dark yellow or   brown urine, yellowing skin or eyes, unusual weakness or fatigue Pain, tingling, or numbness in the hands or feet, muscle weakness, change in vision, confusion or trouble speaking, loss of balance or coordination, trouble walking, seizures Rash, fever, and swollen lymph nodes Redness, blistering, peeling, or loosening of the skin, including inside the mouth Sudden or severe stomach pain, bloody diarrhea, fever, nausea,  vomiting Side effects that usually do not require medical attention (report to your care team if they continue or are bothersome): Bone, joint, or muscle pain Diarrhea Fatigue Loss of appetite Nausea Skin rash This list may not describe all possible side effects. Call your doctor for medical advice about side effects. You may report side effects to FDA at 1-800-FDA-1088. Where should I keep my medication? This medication is given in a hospital or clinic. It will not be stored at home. NOTE: This sheet is a summary. It may not cover all possible information. If you have questions about this medicine, talk to your doctor, pharmacist, or health care provider.  2023 Elsevier/Gold Standard (2021-08-06 00:00:00)   

## 2022-06-15 LAB — T4: T4, Total: 7 ug/dL (ref 4.5–12.0)

## 2022-06-18 ENCOUNTER — Encounter: Payer: Self-pay | Admitting: *Deleted

## 2022-06-18 NOTE — Progress Notes (Signed)
Received faxed request from [facility], Macario Golds for last office note and treatment plan. Faxed to [phone] as requested.

## 2022-06-26 IMAGING — CT CT CHEST-ABD-PELV W/ CM
3 of 7 series · 14 of 46 positions shown, 16 images · IV contrast (OMNIPAQUE)
Comparison: 12/04/2020

CLINICAL DATA: Bladder cancer diagnosed in Saturday March, 2020. Pelvic
nodal metastasis. Status post cystoscopy and left ureteric stent
placement on March 21, 2020. Chemotherapy beginning in Saturday March, 2020. Currently on immunotherapy.

EXAM:
CT CHEST, ABDOMEN, AND PELVIS WITH CONTRAST
TECHNIQUE: Multidetector CT imaging of the chest, abdomen and pelvis was
performed following the standard protocol during bolus
administration of intravenous contrast.

[Series 2: axial post · axial · 0.94mm/px · z∈[+980,+1515]mm · 9 of 135 slices shown, 11 images]
[im 14/135  soft-tissue]
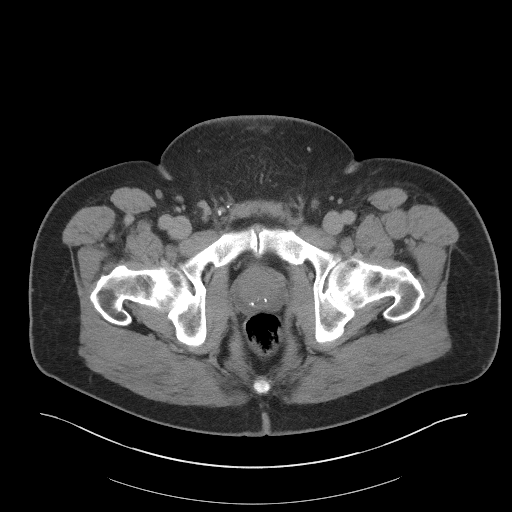
[im 14/135  bone]
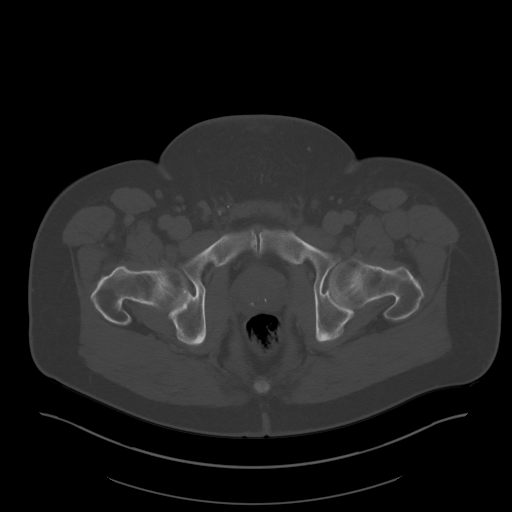
[im 27/135  soft-tissue]
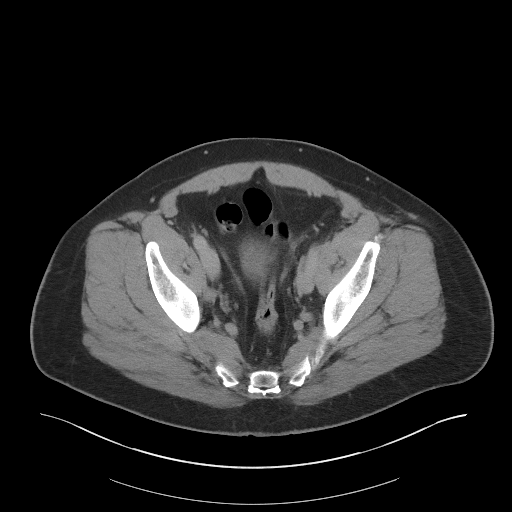
[im 41/135  soft-tissue]
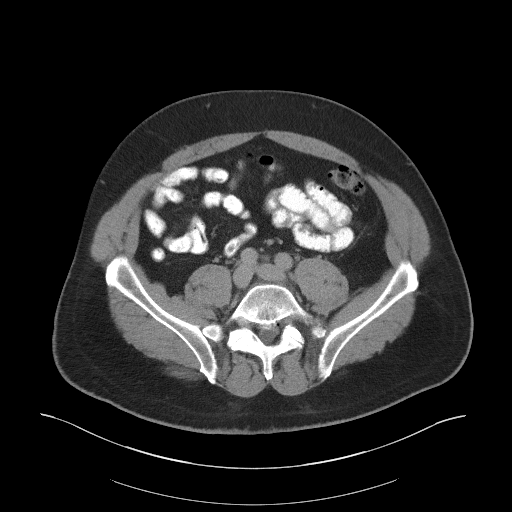
[im 54/135  soft-tissue]
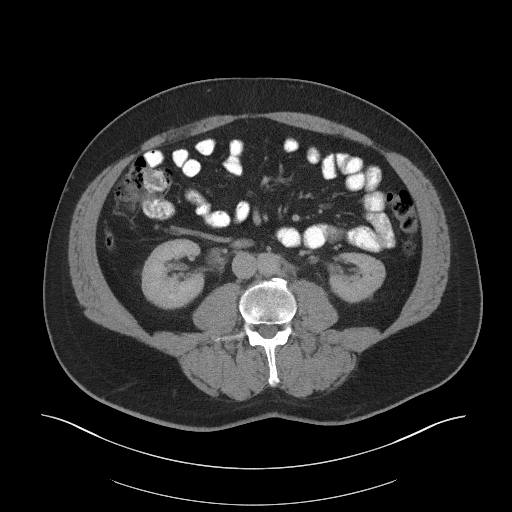
[im 68/135  soft-tissue]
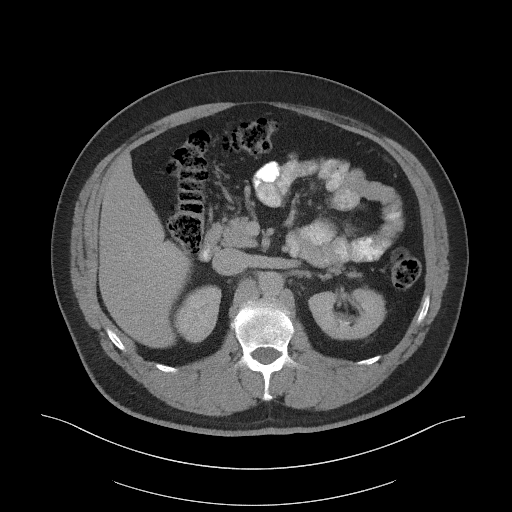
[im 81/135  soft-tissue]
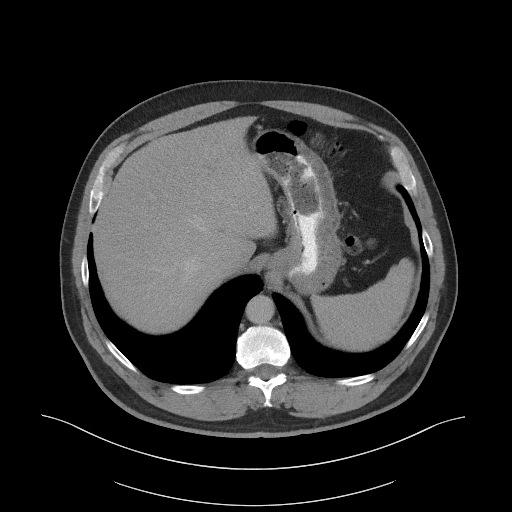
[im 94/135  soft-tissue]
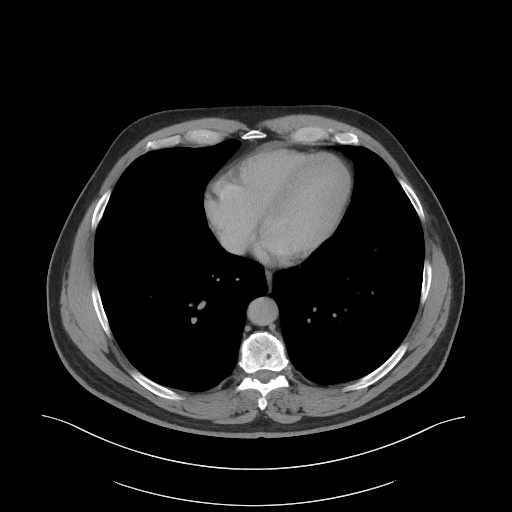
[im 108/135  soft-tissue]
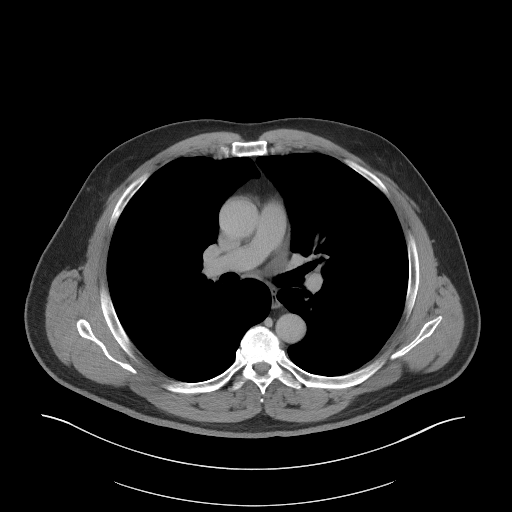
[im 121/135  soft-tissue]
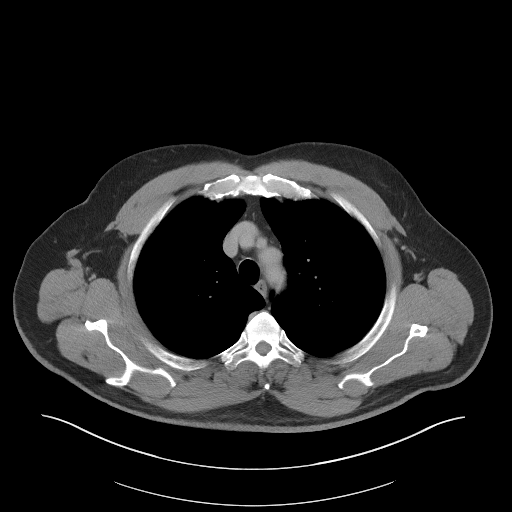
[im 121/135  bone]
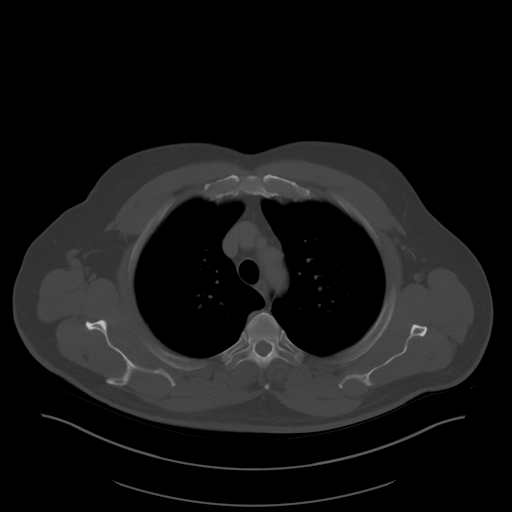

[Series 4: coronal post · coronal · 0.95mm/px · 3 of 98 slices shown]
[im 25/98  soft-tissue]
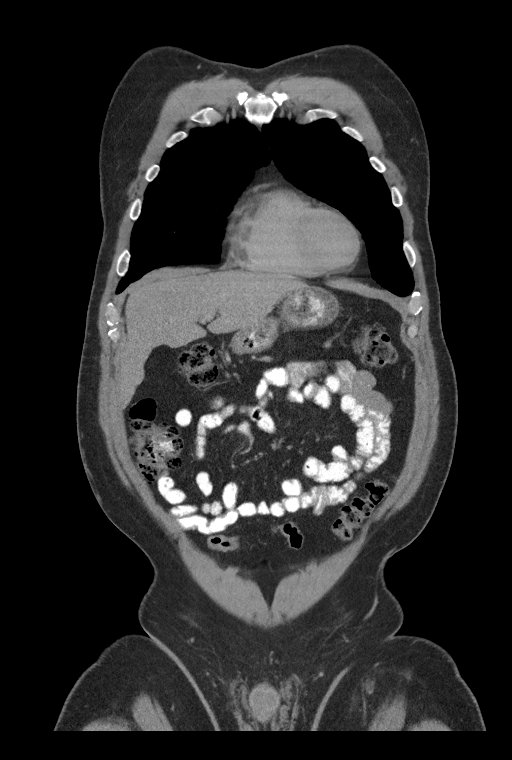
[im 49/98  soft-tissue]
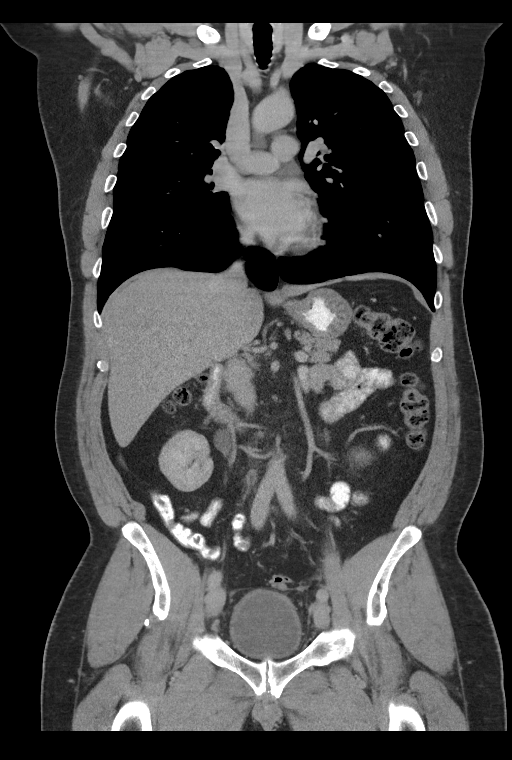
[im 73/98  soft-tissue]
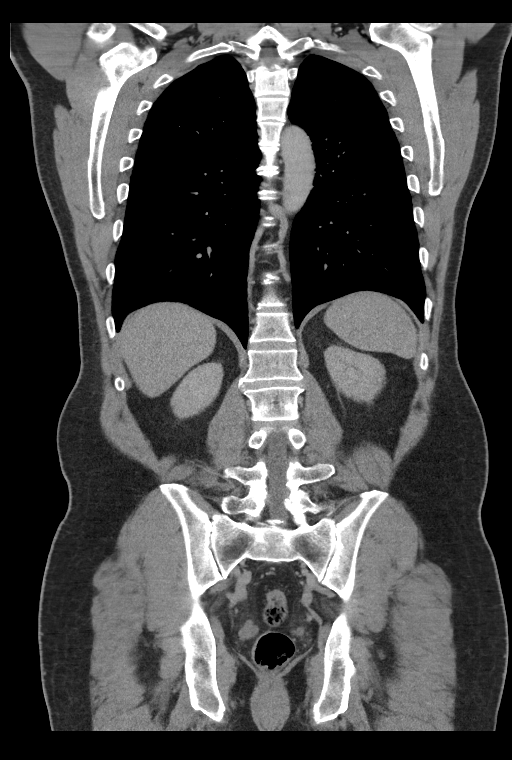

[Series 6: lung post · axial · 0.94mm/px · z∈[+1273,+1323]mm · 2 of 163 slices shown]
[im 13/163  bone]
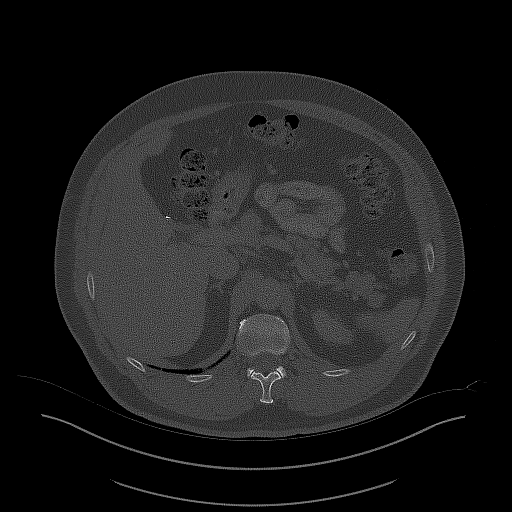
[im 38/163  bone]
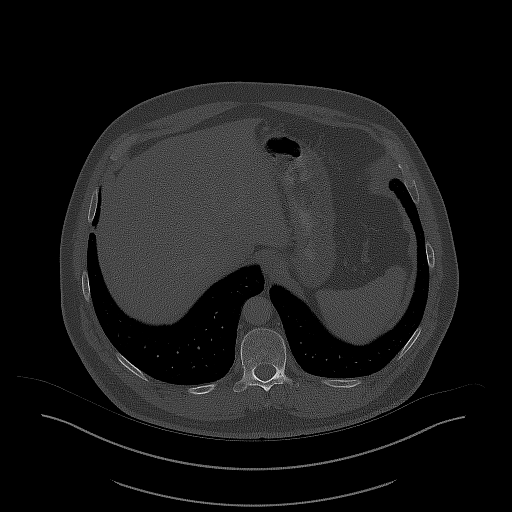

[14 of 46 positions shown; findings below may reference images not displayed]

RADIATION DOSE REDUCTION: This exam was performed according to the
departmental dose-optimization program which includes automated
exposure control, adjustment of the mA and/or kV according to
patient size and/or use of iterative reconstruction technique.

CONTRAST:  100mL OMNIPAQUE IOHEXOL 300 MG/ML  SOLN
FINDINGS: CT CHEST FINDINGS

Cardiovascular: Aortic atherosclerosis. Tortuous thoracic aorta.
Normal heart size, without pericardial effusion. Aortic valve
calcification. No central pulmonary embolism, on this non-dedicated
study.

Mediastinum/Nodes: No supraclavicular adenopathy. No mediastinal or
hilar adenopathy.

Lungs/Pleura: No pleural fluid. Right upper lobe calcified
granuloma. No suspicious pulmonary nodule or mass.

Musculoskeletal: No acute osseous abnormality.

CT ABDOMEN PELVIS FINDINGS

Hepatobiliary: Suspect mild hepatic steatosis. Cholecystectomy,
without biliary ductal dilatation.

Pancreas: Normal, without mass or ductal dilatation.

Spleen: Normal in size, without focal abnormality.

Adrenals/Urinary Tract: Normal adrenal glands. Lower pole left renal
2.0 cm cyst. Bilateral extrarenal pelves, without hydroureter.

Minimal prominence of the left renal collecting system is unchanged.

Moderate renal collecting system opacification on delayed images.
The ureters are poorly opacified. No filling defects in the
opacified collecting systems or ureters.

No enhancing bladder mass or filling defect on delayed images.

Stomach/Bowel: Normal stomach, without wall thickening. Normal
colon, appendix, and terminal ileum. Normal small bowel.

Vascular/Lymphatic: Aortic atherosclerosis. No abdominal
retroperitoneal adenopathy. Small abdominal retroperitoneal nodes
are similar, including an index left periaortic node of 5 mm on
75/2.

Left obturator node measures 9 mm on 110/2 versus 10 mm on the prior
exam (when remeasured).

No new pelvic adenopathy.

Reproductive: Mild prostatomegaly.

Other: No significant free fluid.  No free intraperitoneal air.

The subcutaneous edema at the base of the penis with overlying skin
thickening is similar, including on 129/2.

Musculoskeletal: Scattered pelvic bone islands are tiny. Lumbosacral
spondylosis.
IMPRESSION: 1. No evidence of recurrent bladder cancer or metachronous
urothelial carcinoma.
2. Similar to minimal decrease in upper normal right obturator node.
No new or progressive adenopathy.
3. Similar mild left-sided renal collecting system prominence,
likely related to remote stenting.
4. Similar nonspecific edema about the base of the penis.
5.  No acute process or evidence of metastatic disease in the chest.
6. Aortic valvular calcifications. Consider echocardiography to
evaluate for valvular dysfunction.
7.  Aortic Atherosclerosis ([id number]).

## 2022-07-08 ENCOUNTER — Other Ambulatory Visit: Payer: Self-pay

## 2022-07-25 ENCOUNTER — Encounter: Payer: Self-pay | Admitting: Oncology

## 2022-07-25 ENCOUNTER — Encounter: Payer: Self-pay | Admitting: *Deleted

## 2022-07-25 ENCOUNTER — Inpatient Hospital Stay: Payer: 59 | Attending: Oncology

## 2022-07-25 ENCOUNTER — Inpatient Hospital Stay (HOSPITAL_BASED_OUTPATIENT_CLINIC_OR_DEPARTMENT_OTHER): Payer: 59 | Admitting: Oncology

## 2022-07-25 ENCOUNTER — Inpatient Hospital Stay: Payer: 59

## 2022-07-25 VITALS — BP 153/85 | HR 67 | Temp 98.2°F | Resp 18 | Ht 72.0 in | Wt 241.1 lb

## 2022-07-25 DIAGNOSIS — Z79899 Other long term (current) drug therapy: Secondary | ICD-10-CM | POA: Diagnosis not present

## 2022-07-25 DIAGNOSIS — Z5112 Encounter for antineoplastic immunotherapy: Secondary | ICD-10-CM | POA: Insufficient documentation

## 2022-07-25 DIAGNOSIS — C679 Malignant neoplasm of bladder, unspecified: Secondary | ICD-10-CM

## 2022-07-25 DIAGNOSIS — Z9049 Acquired absence of other specified parts of digestive tract: Secondary | ICD-10-CM | POA: Insufficient documentation

## 2022-07-25 DIAGNOSIS — C772 Secondary and unspecified malignant neoplasm of intra-abdominal lymph nodes: Secondary | ICD-10-CM | POA: Insufficient documentation

## 2022-07-25 DIAGNOSIS — D696 Thrombocytopenia, unspecified: Secondary | ICD-10-CM | POA: Diagnosis not present

## 2022-07-25 LAB — CMP (CANCER CENTER ONLY)
ALT: 15 U/L (ref 0–44)
AST: 18 U/L (ref 15–41)
Albumin: 4.6 g/dL (ref 3.5–5.0)
Alkaline Phosphatase: 61 U/L (ref 38–126)
Anion gap: 9 (ref 5–15)
BUN: 24 mg/dL — ABNORMAL HIGH (ref 8–23)
CO2: 25 mmol/L (ref 22–32)
Calcium: 9.7 mg/dL (ref 8.9–10.3)
Chloride: 103 mmol/L (ref 98–111)
Creatinine: 1.08 mg/dL (ref 0.61–1.24)
GFR, Estimated: >60 mL/min (ref >60)
Glucose, Bld: 127 mg/dL — ABNORMAL HIGH (ref 70–99)
Potassium: 3.7 mmol/L (ref 3.5–5.1)
Sodium: 137 mmol/L (ref 135–145)
Total Bilirubin: 0.5 mg/dL (ref 0.3–1.2)
Total Protein: 7.4 g/dL (ref 6.5–8.1)

## 2022-07-25 LAB — CBC WITH DIFFERENTIAL (CANCER CENTER ONLY)
Abs Immature Granulocytes: 0.01 10*3/uL (ref 0.00–0.07)
Basophils Absolute: 0 10*3/uL (ref 0.0–0.1)
Basophils Relative: 1 %
Eosinophils Absolute: 0.5 10*3/uL (ref 0.0–0.5)
Eosinophils Relative: 8 %
HCT: 43.4 % (ref 39.0–52.0)
Hemoglobin: 15 g/dL (ref 13.0–17.0)
Immature Granulocytes: 0 %
Lymphocytes Relative: 37 %
Lymphs Abs: 2.2 10*3/uL (ref 0.7–4.0)
MCH: 29 pg (ref 26.0–34.0)
MCHC: 34.6 g/dL (ref 30.0–36.0)
MCV: 83.8 fL (ref 80.0–100.0)
Monocytes Absolute: 0.6 10*3/uL (ref 0.1–1.0)
Monocytes Relative: 10 %
Neutro Abs: 2.6 10*3/uL (ref 1.7–7.7)
Neutrophils Relative %: 44 %
Platelet Count: 133 10*3/uL — ABNORMAL LOW (ref 150–400)
RBC: 5.18 MIL/uL (ref 4.22–5.81)
RDW: 12.3 % (ref 11.5–15.5)
WBC Count: 5.9 10*3/uL (ref 4.0–10.5)
nRBC: 0 % (ref 0.0–0.2)

## 2022-07-25 LAB — TSH: TSH: 4.051 u[IU]/mL (ref 0.350–4.500)

## 2022-07-25 MED ORDER — SODIUM CHLORIDE 0.9 % IV SOLN
400.0000 mg | Freq: Once | INTRAVENOUS | Status: AC
  Administered 2022-07-25: 400 mg via INTRAVENOUS
  Filled 2022-07-25: qty 16

## 2022-07-25 MED ORDER — SODIUM CHLORIDE 0.9 % IV SOLN: Freq: Once | INTRAVENOUS | Status: AC

## 2022-07-25 NOTE — Patient Instructions (Signed)
[facility]   Discharge Instructions: Thank you for choosing [facility] to provide your oncology and hematology care.   If you have a lab appointment with the Cancer Center, please go directly to the Cancer Center and check in at the registration area.   Wear comfortable clothing and clothing appropriate for easy access to any Portacath or PICC line.   We strive to give you quality time with your provider. You may need to reschedule your appointment if you arrive late (15 or more minutes).  Arriving late affects you and other patients whose appointments are after yours.  Also, if you miss three or more appointments without notifying the office, you may be dismissed from the clinic at the provider's discretion.      For prescription refill requests, have your pharmacy contact our office and allow 72 hours for refills to be completed.    Today you received the following chemotherapy and/or immunotherapy agents Keytruda      To help prevent nausea and vomiting after your treatment, we encourage you to take your nausea medication as directed.  BELOW ARE SYMPTOMS THAT SHOULD BE REPORTED IMMEDIATELY: *FEVER GREATER THAN 100.4 F (38 C) OR HIGHER *CHILLS OR SWEATING *NAUSEA AND VOMITING THAT IS NOT CONTROLLED WITH YOUR NAUSEA MEDICATION *UNUSUAL SHORTNESS OF BREATH *UNUSUAL BRUISING OR BLEEDING *URINARY PROBLEMS (pain or burning when urinating, or frequent urination) *BOWEL PROBLEMS (unusual diarrhea, constipation, pain near the anus) TENDERNESS IN MOUTH AND THROAT WITH OR WITHOUT PRESENCE OF ULCERS (sore throat, sores in mouth, or a toothache) UNUSUAL RASH, SWELLING OR PAIN  UNUSUAL VAGINAL DISCHARGE OR ITCHING   Items with * indicate a potential emergency and should be followed up as soon as possible or go to the Emergency Department if any problems should occur.  Please show the CHEMOTHERAPY ALERT CARD or IMMUNOTHERAPY ALERT CARD at  check-in to the Emergency Department and triage nurse.  Should you have questions after your visit or need to cancel or reschedule your appointment, please contact [facility]  Dept: [phone]  and follow the prompts.  Office hours are 8:00 a.m. to 4:30 p.m. Monday - Friday. Please note that voicemails left after 4:00 p.m. may not be returned until the following business day.  We are closed weekends and major holidays. You have access to a nurse at all times for urgent questions. Please call the main number to the clinic Dept: [phone] and follow the prompts.   For any non-urgent questions, you may also contact your provider using MyChart. We now offer e-Visits for anyone 18 and older to request care online for non-urgent symptoms. For details visit [email or web address].   Also download the MyChart app! Go to the app store, search "MyChart", open the app, select [place] [place], and log in with your MyChart username and password.   

## 2022-07-25 NOTE — Progress Notes (Signed)
  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  He completed another treatment pembrolizumab on 06/13/2022.  He tolerated treatment well.  No rash or diarrhea.  No complaint.  He reports intentional weight loss with a fasting diet and exercise.  No hematuria.  Objective:  Vital signs in last 24 hours:  Blood pressure (!) 153/85, pulse 67, temperature 98.2 F (36.8 C), temperature source Oral, resp. rate 18, height 6' (1.829 m), weight 241 lb 1.6 oz (109.4 kg), SpO2 100 %.    Lymphatics: No cervical, supraclavicular, axillary, or inguinal nodes Resp: Lungs clear bilaterally Cardio: Regular rate and rhythm GI: No hepatosplenomegaly, no mass, nontender Vascular: No leg edema  Skin: No rash  Lab Results:  Lab Results  Component Value Date   WBC 5.9 07/25/2022   HGB 15.0 07/25/2022   HCT 43.4 07/25/2022   MCV 83.8 07/25/2022   PLT 133 (L) 07/25/2022   NEUTROABS 2.6 07/25/2022    CMP  Lab Results  Component Value Date   NA 137 07/25/2022   K 3.7 07/25/2022   CL 103 07/25/2022   CO2 25 07/25/2022   GLUCOSE 127 (H) 07/25/2022   BUN 24 (H) 07/25/2022   CREATININE 1.08 07/25/2022   CALCIUM 9.7 07/25/2022   PROT 7.4 07/25/2022   ALBUMIN 4.6 07/25/2022   AST 18 07/25/2022   ALT 15 07/25/2022   ALKPHOS 61 07/25/2022   BILITOT 0.5 07/25/2022   GFRNONAA >60 07/25/2022     Medications: I have reviewed the patient's current medications.   Assessment/Plan: Metastatic high-grade urothelial carcinoma Presenting with left leg swelling and weakness November 2021 CT abdomen/pelvis 02/17/2020-left hydronephrosis, retroperitoneal adenopathy, scrotal thickening and edema CT-guided biopsy of left retroperitoneal lymphadenopathy 03/05/2020-poorly differentiated carcinoma consistent with a urothelial primary Left trigone biopsy 03/21/2020-small foci of infiltrative high-grade urothelial carcinoma  with invasion of the muscularis propria and lymphovascular invasion Renal pelvis and left upper tract washing 03/21/2020-high-grade urothelial carcinoma PET 03/21/2020-extensive bilateral retroperitoneal, pelvic, and inguinal hypermetabolic adenopathy, hypermetabolism in the antral region of the stomach with no obvious mass on CT, no evidence of metastatic disease in the neck, chest, or bones Gemcitabine/cisplatin for 3 cycles beginning 03/27/2020 CT 06/27/2020-marked interval response with some mildly enlarged lymph nodes in the retroperitoneum and pelvis in the range of 1 cm, stranding extending into the base of the penis and suprapubic soft tissue-similar Pembrolizumab beginning 06/08/2020, changed to every 6-week dosing beginning with cycle 2 CTs 09/18/2021-no evidence of metastatic or recurrent disease in the chest, abdomen, or pelvis, stable 10 mm right pelvic sidewall node mild stranding about the base of the penis PET 05/30/2022-stable compared to 10/18/2021, no evidence of metastatic disease, right pelvic sidewall node described on the 2023 CT is not hypermetabolic  2.  Status post cholecystectomy 3.  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  He has completed 2 years of pembrolizumab.  He would like to complete 2 more cycles beginning with another treatment today.  He is tolerating the pembrolizumab well.  He will return for an office visit and the final planned cycle of pembrolizumab in 6 weeks.  [name], MD  07/25/2022  9:11 AM

## 2022-07-25 NOTE — Progress Notes (Signed)
Patient seen by Dr. Truett Perna today  Vitals are within treatment parameters.No intervention needed for BP 135/85  Labs reviewed by Dr. Truett Perna and are within treatment parameters.  Per physician team, patient is ready for treatment and there are NO modifications to the treatment plan.

## 2022-07-26 ENCOUNTER — Other Ambulatory Visit: Payer: Self-pay

## 2022-07-26 LAB — T4: T4, Total: 7.6 ug/dL (ref 4.5–12.0)

## 2022-08-14 ENCOUNTER — Other Ambulatory Visit: Payer: Self-pay

## 2022-09-05 ENCOUNTER — Encounter: Payer: Self-pay | Admitting: *Deleted

## 2022-09-05 ENCOUNTER — Inpatient Hospital Stay: Payer: 59

## 2022-09-05 ENCOUNTER — Encounter: Payer: Self-pay | Admitting: Oncology

## 2022-09-05 ENCOUNTER — Inpatient Hospital Stay (HOSPITAL_BASED_OUTPATIENT_CLINIC_OR_DEPARTMENT_OTHER): Payer: 59 | Admitting: Oncology

## 2022-09-05 ENCOUNTER — Inpatient Hospital Stay: Payer: 59 | Attending: Oncology

## 2022-09-05 VITALS — BP 136/76 | HR 65 | Resp 18

## 2022-09-05 DIAGNOSIS — C679 Malignant neoplasm of bladder, unspecified: Secondary | ICD-10-CM | POA: Insufficient documentation

## 2022-09-05 DIAGNOSIS — Z79899 Other long term (current) drug therapy: Secondary | ICD-10-CM | POA: Insufficient documentation

## 2022-09-05 DIAGNOSIS — C772 Secondary and unspecified malignant neoplasm of intra-abdominal lymph nodes: Secondary | ICD-10-CM | POA: Diagnosis present

## 2022-09-05 DIAGNOSIS — Z5112 Encounter for antineoplastic immunotherapy: Secondary | ICD-10-CM | POA: Diagnosis present

## 2022-09-05 DIAGNOSIS — D696 Thrombocytopenia, unspecified: Secondary | ICD-10-CM | POA: Diagnosis not present

## 2022-09-05 LAB — CBC WITH DIFFERENTIAL (CANCER CENTER ONLY)
Abs Immature Granulocytes: 0.01 10*3/uL (ref 0.00–0.07)
Basophils Absolute: 0.1 10*3/uL (ref 0.0–0.1)
Basophils Relative: 1 %
Eosinophils Absolute: 0.5 10*3/uL (ref 0.0–0.5)
Eosinophils Relative: 8 %
HCT: 42 % (ref 39.0–52.0)
Hemoglobin: 14.5 g/dL (ref 13.0–17.0)
Immature Granulocytes: 0 %
Lymphocytes Relative: 34 %
Lymphs Abs: 2 10*3/uL (ref 0.7–4.0)
MCH: 28.8 pg (ref 26.0–34.0)
MCHC: 34.5 g/dL (ref 30.0–36.0)
MCV: 83.5 fL (ref 80.0–100.0)
Monocytes Absolute: 0.6 10*3/uL (ref 0.1–1.0)
Monocytes Relative: 10 %
Neutro Abs: 2.7 10*3/uL (ref 1.7–7.7)
Neutrophils Relative %: 47 %
Platelet Count: 143 10*3/uL — ABNORMAL LOW (ref 150–400)
RBC: 5.03 MIL/uL (ref 4.22–5.81)
RDW: 12.6 % (ref 11.5–15.5)
WBC Count: 5.8 10*3/uL (ref 4.0–10.5)
nRBC: 0 % (ref 0.0–0.2)

## 2022-09-05 LAB — CMP (CANCER CENTER ONLY)
ALT: 11 U/L (ref 0–44)
AST: 15 U/L (ref 15–41)
Albumin: 4.4 g/dL (ref 3.5–5.0)
Alkaline Phosphatase: 54 U/L (ref 38–126)
Anion gap: 9 (ref 5–15)
BUN: 21 mg/dL (ref 8–23)
CO2: 26 mmol/L (ref 22–32)
Calcium: 9.3 mg/dL (ref 8.9–10.3)
Chloride: 103 mmol/L (ref 98–111)
Creatinine: 1.16 mg/dL (ref 0.61–1.24)
GFR, Estimated: >60 mL/min (ref >60)
Glucose, Bld: 138 mg/dL — ABNORMAL HIGH (ref 70–99)
Potassium: 3.6 mmol/L (ref 3.5–5.1)
Sodium: 138 mmol/L (ref 135–145)
Total Bilirubin: 0.4 mg/dL (ref 0.3–1.2)
Total Protein: 7.3 g/dL (ref 6.5–8.1)

## 2022-09-05 LAB — TSH: TSH: 3.404 u[IU]/mL (ref 0.350–4.500)

## 2022-09-05 MED ORDER — SODIUM CHLORIDE 0.9 % IV SOLN: Freq: Once | INTRAVENOUS | Status: AC

## 2022-09-05 MED ORDER — SODIUM CHLORIDE 0.9 % IV SOLN
400.0000 mg | Freq: Once | INTRAVENOUS | Status: AC
  Administered 2022-09-05: 400 mg via INTRAVENOUS
  Filled 2022-09-05: qty 16

## 2022-09-05 NOTE — Progress Notes (Signed)
Patient seen by Dr. Sherrill today ? ?Vitals are within treatment parameters. ? ?Labs reviewed by Dr. Sherrill and are within treatment parameters. ? ?Per physician team, patient is ready for treatment and there are NO modifications to the treatment plan.  ?

## 2022-09-05 NOTE — Progress Notes (Signed)
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  He tolerated the treatment well.  No rash or diarrhea.  Good appetite and energy level.  He is exercising and working.  He reports the suprapubic edema worsens immediately following immunotherapy and then improves.  No hematuria.  Objective:  Vital signs in last 24 hours:  Blood pressure 134/79, pulse 72, temperature 98.2 F (36.8 C), temperature source Tympanic, resp. rate 18, weight 239 lb 4.8 oz (108.5 kg), SpO2 100 %.    Lymphatics: No cervical, supraclavicular, axillary, or inguinal nodes Resp: Lungs clear bilaterally Cardio: Regular rate and rhythm GI: No hepatosplenomegaly, mild suprapubic edema, no mass Vascular: No leg edema Skin: No rash  Lab Results:  Lab Results  Component Value Date   WBC 5.8 09/05/2022   HGB 14.5 09/05/2022   HCT 42.0 09/05/2022   MCV 83.5 09/05/2022   PLT 143 (L) 09/05/2022   NEUTROABS 2.7 09/05/2022    CMP  Lab Results  Component Value Date   NA 137 07/25/2022   K 3.7 07/25/2022   CL 103 07/25/2022   CO2 25 07/25/2022   GLUCOSE 127 (H) 07/25/2022   BUN 24 (H) 07/25/2022   CREATININE 1.08 07/25/2022   CALCIUM 9.7 07/25/2022   PROT 7.4 07/25/2022   ALBUMIN 4.6 07/25/2022   AST 18 07/25/2022   ALT 15 07/25/2022   ALKPHOS 61 07/25/2022   BILITOT 0.5 07/25/2022   GFRNONAA >60 07/25/2022    No results found for: "CEA1", "CEA", "CAN199", "CA125"  Lab Results  Component Value Date   INR 1.0 03/05/2020   LABPROT 13.2 03/05/2020    Imaging:  No results found.  Medications: I have reviewed the patient's current medications.   Assessment/Plan: Metastatic high-grade urothelial carcinoma Presenting with left leg swelling and weakness November 2021 CT abdomen/pelvis 02/17/2020-left hydronephrosis, retroperitoneal adenopathy, scrotal thickening and  edema CT-guided biopsy of left retroperitoneal lymphadenopathy 03/05/2020-poorly differentiated carcinoma consistent with a urothelial primary Left trigone biopsy 03/21/2020-small foci of infiltrative high-grade urothelial carcinoma with invasion of the muscularis propria and lymphovascular invasion Renal pelvis and left upper tract washing 03/21/2020-high-grade urothelial carcinoma PET 03/21/2020-extensive bilateral retroperitoneal, pelvic, and inguinal hypermetabolic adenopathy, hypermetabolism in the antral region of the stomach with no obvious mass on CT, no evidence of metastatic disease in the neck, chest, or bones Gemcitabine/cisplatin for 3 cycles beginning 03/27/2020 CT 06/27/2020-marked interval response with some mildly enlarged lymph nodes in the retroperitoneum and pelvis in the range of 1 cm, stranding extending into the base of the penis and suprapubic soft tissue-similar Pembrolizumab beginning 06/08/2020, changed to every 6-week dosing beginning with cycle 2 CTs 09/18/2021-no evidence of metastatic or recurrent disease in the chest, abdomen, or pelvis, stable 10 mm right pelvic sidewall node mild stranding about the base of the penis PET 05/30/2022-stable compared to 10/18/2021, no evidence of metastatic disease, right pelvic sidewall node described on the 2023 CT is not hypermetabolic  2.  Status post cholecystectomy 3.  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  He has been maintained on pembrolizumab since March 2022.  He will complete a final treatment with pembrolizumab today.  He will return for an office visit and restaging PET scan in September.  He will call in the interim for new symptoms.  He continues follow-up with [name].  Edward Papas, MD  09/05/2022  8:18 AM

## 2022-09-05 NOTE — Patient Instructions (Signed)
[facility]   Discharge Instructions: Thank you for choosing [facility] to provide your oncology and hematology care.   If you have a lab appointment with the Cancer Center, please go directly to the Cancer Center and check in at the registration area.   Wear comfortable clothing and clothing appropriate for easy access to any Portacath or PICC line.   We strive to give you quality time with your provider. You may need to reschedule your appointment if you arrive late (15 or more minutes).  Arriving late affects you and other patients whose appointments are after yours.  Also, if you miss three or more appointments without notifying the office, you may be dismissed from the clinic at the provider's discretion.      For prescription refill requests, have your pharmacy contact our office and allow 72 hours for refills to be completed.    Today you received the following chemotherapy and/or immunotherapy agents Pembrolizumab (KEYTRUDA).      To help prevent nausea and vomiting after your treatment, we encourage you to take your nausea medication as directed.  BELOW ARE SYMPTOMS THAT SHOULD BE REPORTED IMMEDIATELY: *FEVER GREATER THAN 100.4 F (38 C) OR HIGHER *CHILLS OR SWEATING *NAUSEA AND VOMITING THAT IS NOT CONTROLLED WITH YOUR NAUSEA MEDICATION *UNUSUAL SHORTNESS OF BREATH *UNUSUAL BRUISING OR BLEEDING *URINARY PROBLEMS (pain or burning when urinating, or frequent urination) *BOWEL PROBLEMS (unusual diarrhea, constipation, pain near the anus) TENDERNESS IN MOUTH AND THROAT WITH OR WITHOUT PRESENCE OF ULCERS (sore throat, sores in mouth, or a toothache) UNUSUAL RASH, SWELLING OR PAIN  UNUSUAL VAGINAL DISCHARGE OR ITCHING   Items with * indicate a potential emergency and should be followed up as soon as possible or go to the Emergency Department if any problems should occur.  Please show the CHEMOTHERAPY ALERT CARD or IMMUNOTHERAPY  ALERT CARD at check-in to the Emergency Department and triage nurse.  Should you have questions after your visit or need to cancel or reschedule your appointment, please contact [facility]  Dept: [phone]  and follow the prompts.  Office hours are 8:00 a.m. to 4:30 p.m. Monday - Friday. Please note that voicemails left after 4:00 p.m. may not be returned until the following business day.  We are closed weekends and major holidays. You have access to a nurse at all times for urgent questions. Please call the main number to the clinic Dept: [phone] and follow the prompts.   For any non-urgent questions, you may also contact your provider using MyChart. We now offer e-Visits for anyone 6 and older to request care online for non-urgent symptoms. For details visit [email or web address].   Also download the MyChart app! Go to the app store, search "MyChart", open the app, select [place], and log in with your MyChart username and password.  Pembrolizumab Injection What is this medication? PEMBROLIZUMAB (PEM broe LIZ ue mab) treats some types of cancer. It works by helping your immune system slow or stop the spread of cancer cells. It is a monoclonal antibody. This medicine may be used for other purposes; ask your health care provider or pharmacist if you have questions. COMMON BRAND NAME(S): Keytruda What should I tell my care team before I take this medication? They need to know if you have any of these conditions: Allogeneic stem cell transplant (uses someone else's stem cells) Autoimmune diseases, such as Crohn disease, ulcerative colitis, lupus History of chest radiation Nervous  system problems, such as Guillain-Barre syndrome, myasthenia gravis Organ transplant An unusual or allergic reaction to pembrolizumab, other medications, foods, dyes, or preservatives Pregnant or trying to get pregnant Breast-feeding How should I use this  medication? This medication is injected into a vein. It is given by your care team in a hospital or clinic setting. A special MedGuide will be given to you before each treatment. Be sure to read this information carefully each time. Talk to your care team about the use of this medication in children. While it may be prescribed for children as young as 6 months for selected conditions, precautions do apply. Overdosage: If you think you have taken too much of this medicine contact a poison control center or emergency room at once. NOTE: This medicine is only for you. Do not share this medicine with others. What if I miss a dose? Keep appointments for follow-up doses. It is important not to miss your dose. Call your care team if you are unable to keep an appointment. What may interact with this medication? Interactions have not been studied. This list may not describe all possible interactions. Give your health care provider a list of all the medicines, herbs, non-prescription drugs, or dietary supplements you use. Also tell them if you smoke, drink alcohol, or use illegal drugs. Some items may interact with your medicine. What should I watch for while using this medication? Your condition will be monitored carefully while you are receiving this medication. You may need blood work while taking this medication. This medication may cause serious skin reactions. They can happen weeks to months after starting the medication. Contact your care team right away if you notice fevers or flu-like symptoms with a rash. The rash may be red or purple and then turn into blisters or peeling of the skin. You may also notice a red rash with swelling of the face, lips, or lymph nodes in your neck or under your arms. Tell your care team right away if you have any change in your eyesight. Talk to your care team if you may be pregnant. Serious birth defects can occur if you take this medication during pregnancy and for 4  months after the last dose. You will need a negative pregnancy test before starting this medication. Contraception is recommended while taking this medication and for 4 months after the last dose. Your care team can help you find the option that works for you. Do not breastfeed while taking this medication and for 4 months after the last dose. What side effects may I notice from receiving this medication? Side effects that you should report to your care team as soon as possible: Allergic reactions--skin rash, itching, hives, swelling of the face, lips, tongue, or throat Dry cough, shortness of breath or trouble breathing Eye pain, redness, irritation, or discharge with blurry or decreased vision Heart muscle inflammation--unusual weakness or fatigue, shortness of breath, chest pain, fast or irregular heartbeat, dizziness, swelling of the ankles, feet, or hands Hormone gland problems--headache, sensitivity to light, unusual weakness or fatigue, dizziness, fast or irregular heartbeat, increased sensitivity to cold or heat, excessive sweating, constipation, hair loss, increased thirst or amount of urine, tremors or shaking, irritability Infusion reactions--chest pain, shortness of breath or trouble breathing, feeling faint or lightheaded Kidney injury (glomerulonephritis)--decrease in the amount of urine, red or dark brown urine, foamy or bubbly urine, swelling of the ankles, hands, or feet Liver injury--right upper belly pain, loss of appetite, nausea, light-colored stool, dark yellow or  brown urine, yellowing skin or eyes, unusual weakness or fatigue Pain, tingling, or numbness in the hands or feet, muscle weakness, change in vision, confusion or trouble speaking, loss of balance or coordination, trouble walking, seizures Rash, fever, and swollen lymph nodes Redness, blistering, peeling, or loosening of the skin, including inside the mouth Sudden or severe stomach pain, bloody diarrhea, fever, nausea,  vomiting Side effects that usually do not require medical attention (report to your care team if they continue or are bothersome): Bone, joint, or muscle pain Diarrhea Fatigue Loss of appetite Nausea Skin rash This list may not describe all possible side effects. Call your doctor for medical advice about side effects. You may report side effects to FDA at 1-800-FDA-1088. Where should I keep my medication? This medication is given in a hospital or clinic. It will not be stored at home. NOTE: This sheet is a summary. It may not cover all possible information. If you have questions about this medicine, talk to your doctor, pharmacist, or health care provider.  2024 Elsevier/Gold Standard (2021-08-06 00:00:00)

## 2022-09-06 ENCOUNTER — Other Ambulatory Visit: Payer: Self-pay

## 2022-09-07 LAB — T4: T4, Total: 8.4 ug/dL (ref 4.5–12.0)

## 2022-09-27 ENCOUNTER — Other Ambulatory Visit: Payer: Self-pay

## 2022-11-13 IMAGING — CT CT CHEST-ABD-PELV W/ CM
3 of 12 series · 9 of 46 positions shown, 15 images · IV contrast (APPLIED)
Comparison: Comparison is made with May 01, 2021.

CLINICAL DATA: A 61-year-old male presents for follow-up of
invasive bladder cancer.

* Tracking Code: BO *
EXAM:
CT ABDOMEN AND PELVIS WITHOUT AND CHEST, ABDOMEN, AND PELVIS WITH
CONTRAST
TECHNIQUE: Multidetector CT imaging of the chest, abdomen and pelvis was
performed following the standard protocol during bolus
administration of intravenous contrast. Imaging of the abdomen and
pelvis was a acquired prior to contrast administration.

[Series 4: axial post · axial · 0.97mm/px · z∈[-431,-11]mm · 4 of 141 slices shown, 9 images]
[im 29/141  soft-tissue]
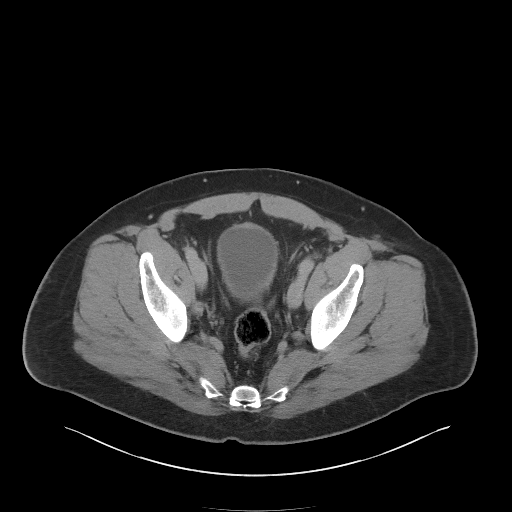
[im 29/141  lung]
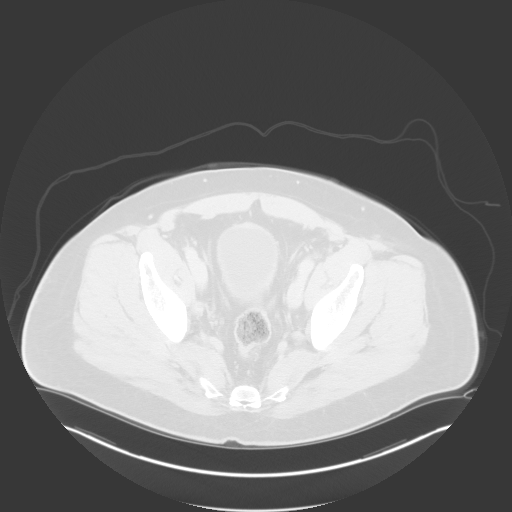
[im 29/141  bone]
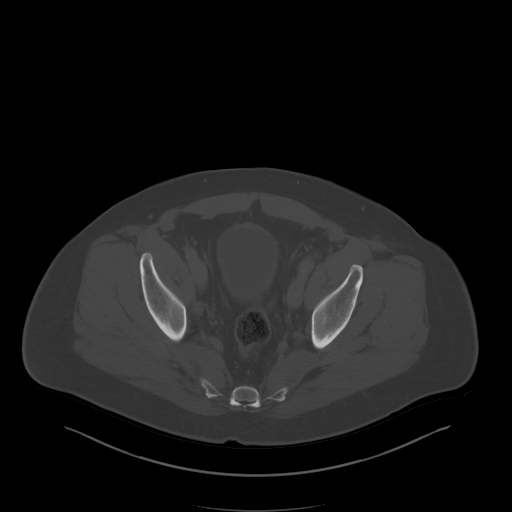
[im 57/141  soft-tissue]
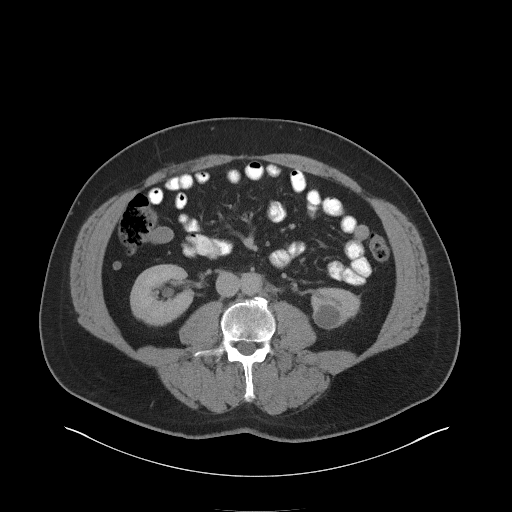
[im 57/141  lung]
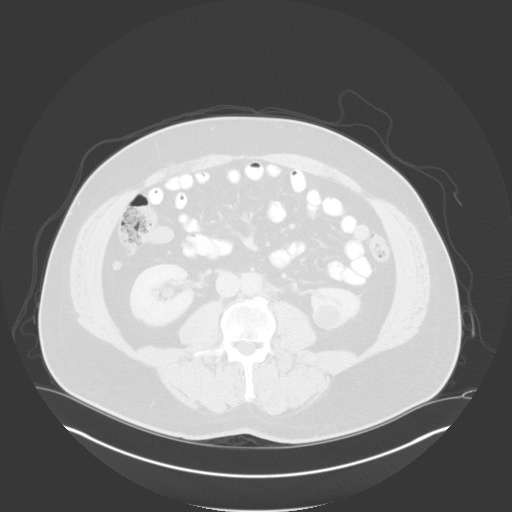
[im 85/141  soft-tissue]
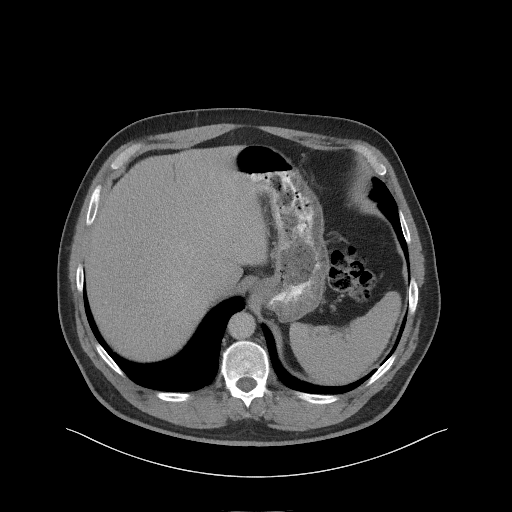
[im 85/141  lung]
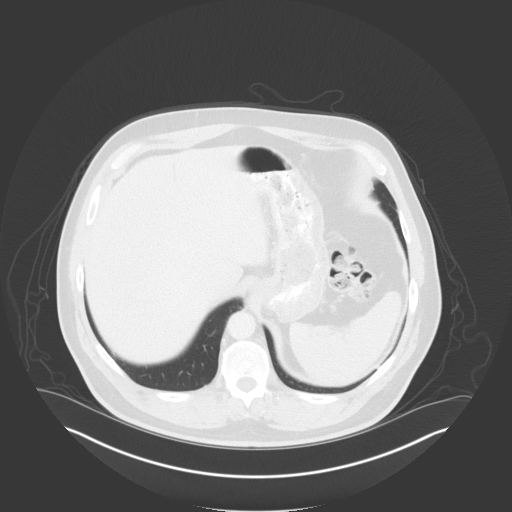
[im 113/141  soft-tissue]
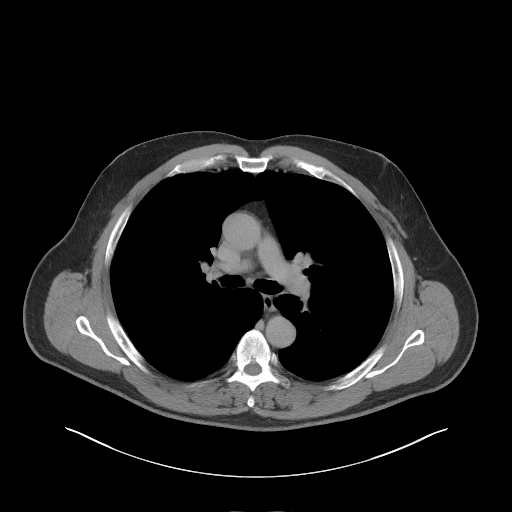
[im 113/141  lung]
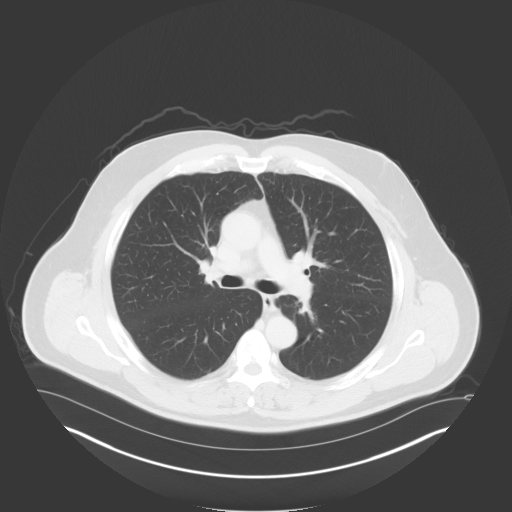

[Series 11: coronal post · coronal · 0.97mm/px · 2 of 102 slices shown, 3 images]
[im 34/102  soft-tissue]
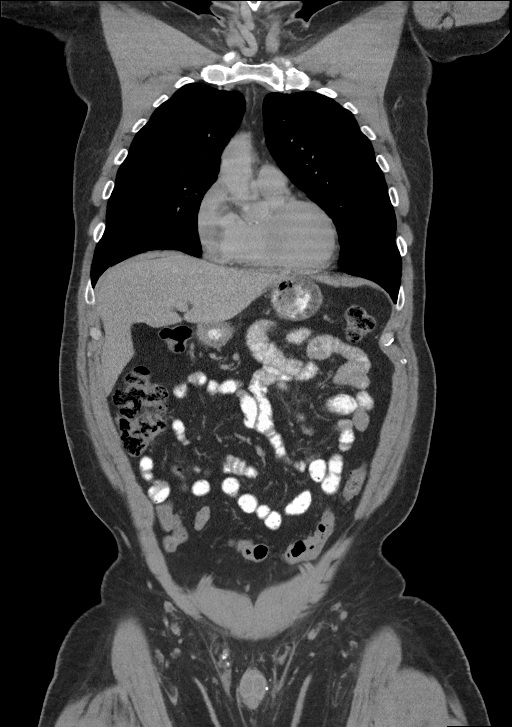
[im 34/102  bone]
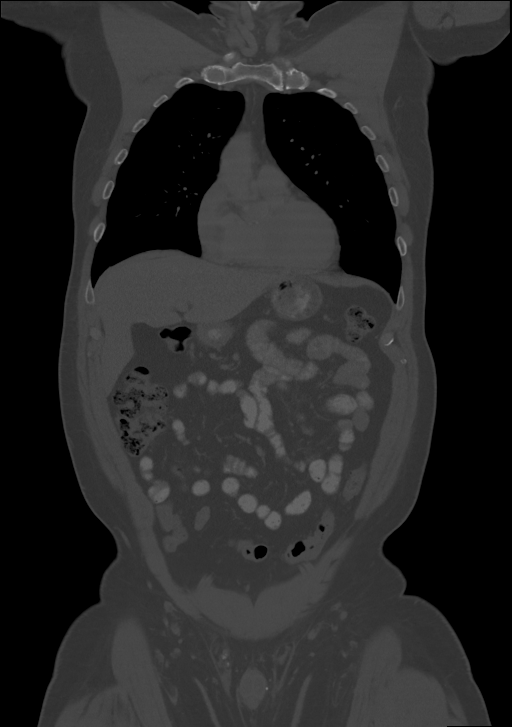
[im 68/102  soft-tissue]
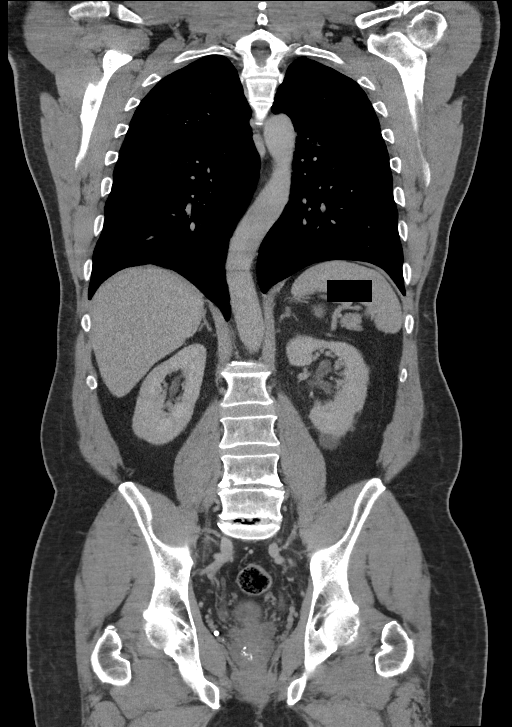

[Series 16: axial delay · axial · delayed · 0.88mm/px · z∈[-457,-217]mm · 3 of 97 slices shown]
[im 25/97  soft-tissue]
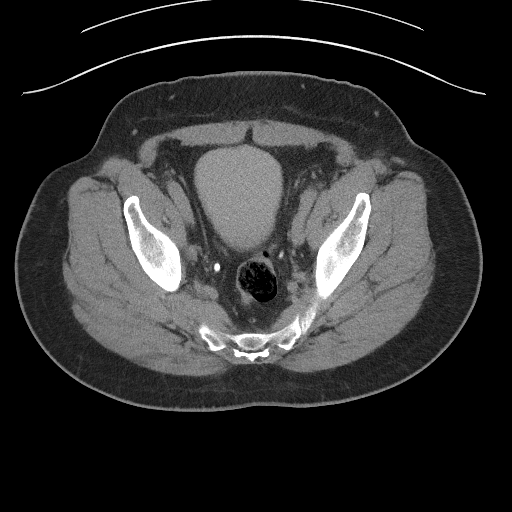
[im 49/97  soft-tissue]
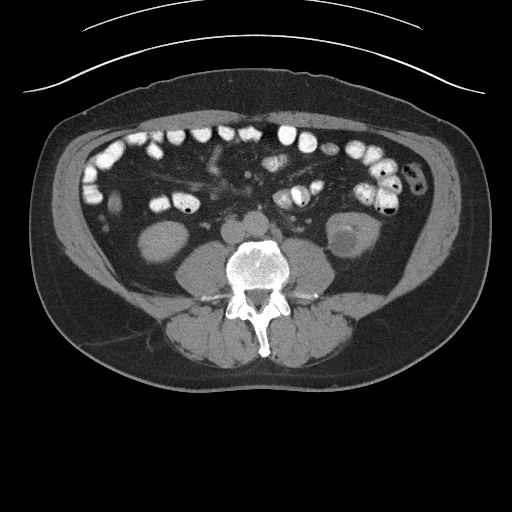
[im 73/97  soft-tissue]
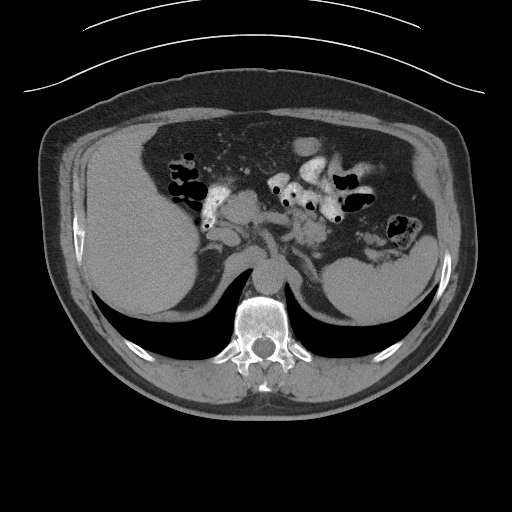

[9 of 46 positions shown; findings below may reference images not displayed]

RADIATION DOSE REDUCTION: This exam was performed according to the
departmental dose-optimization program which includes automated
exposure control, adjustment of the mA and/or kV according to
patient size and/or use of iterative reconstruction technique.

CONTRAST:  100mL OMNIPAQUE IOHEXOL 300 MG/ML  SOLN
FINDINGS: CT CHEST FINDINGS

Cardiovascular: Scattered aortic atherosclerosis. Normal caliber
thoracic aorta. Normal heart size. No substantial pericardial
effusion. No pericardial nodularity. Normal caliber central
pulmonary vasculature.

Mediastinum/Nodes: No axillary, hilar or mediastinal
lymphadenopathy. Esophagus grossly normal.

Lungs/Pleura: Lungs are clear and airways are patent. Minimal
basilar atelectasis.

Musculoskeletal: See below for full musculoskeletal details.

CT ABDOMEN PELVIS FINDINGS

Hepatobiliary: Post cholecystectomy. No suspicious hepatic lesion.
Portal vein is patent. Liver with smooth contours.

Pancreas: Normal, without mass, inflammation or ductal dilatation.

Spleen: Normal.

Adrenals/Urinary Tract: Adrenal glands are normal.

Fullness of bilateral extrarenal pelves without change from previous
imaging. No suspicious renal lesion.

No abnormal enhancement along the course of LEFT or RIGHT ureter.
Mild Peri ureteral stranding is stable throughout the
retroperitoneum. Urinary bladder grossly normal, under distended.

Stomach/Bowel: No acute gastrointestinal findings. The appendix is
normal.

Vascular/Lymphatic: No adenopathy in the retroperitoneum.

No adenopathy in the pelvis.

Scattered aortic atherosclerosis without aortic dilation.

Reproductive: Mild stranding above the base of the penis is of
uncertain significance but unchanged. Reproductive structures
otherwise unremarkable.

Other: No ascites.

Musculoskeletal: No acute bone finding. No destructive bone process.
Spinal degenerative changes.

Excretory phase: Limited opacification of the ureters and collecting
systems as well as urinary bladder. No visible filling defect or
secondary signs to suggest stricture at this time and no change from
previous imaging in the appearance of the urinary tract. The
appearance of the bilateral UPJ raise the question of mild UPJ
obstruction. This finding is unchanged over multiple exams.
Attention on follow-up.
IMPRESSION: 1. No evidence of metastatic or recurrent disease in the chest,
abdomen or pelvis.
2. Stable appearance of RIGHT pelvic sidewall lymph node measuring
10 mm.
3. Mild stranding above the base of the penis is of uncertain
significance but unchanged from previous imaging. Correlate with
direct clinical inspection for any signs of inflammation.
4. Aortic atherosclerosis.

Aortic Atherosclerosis ([id number]-ZX5.5).

## 2022-11-25 ENCOUNTER — Telehealth: Payer: Self-pay | Admitting: *Deleted

## 2022-11-25 NOTE — Telephone Encounter (Signed)
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 Reviewed date/time for his lab/OV as well.

## 2022-12-03 ENCOUNTER — Encounter: Payer: Self-pay | Admitting: Oncology

## 2022-12-26 ENCOUNTER — Ambulatory Visit (HOSPITAL_COMMUNITY)
Admission: RE | Admit: 2022-12-26 | Discharge: 2022-12-26 | Disposition: A | Discharge status: Home / Self Care | Payer: 59 | Source: Ambulatory Visit | Attending: Oncology | Admitting: Oncology

## 2022-12-26 DIAGNOSIS — C679 Malignant neoplasm of bladder, unspecified: Secondary | ICD-10-CM | POA: Diagnosis present

## 2022-12-26 LAB — GLUCOSE, CAPILLARY: Glucose-Capillary: 127 mg/dL — ABNORMAL HIGH (ref 70–99)

## 2022-12-26 MED ORDER — FLUDEOXYGLUCOSE F - 18 (FDG) INJECTION
11.6500 | Freq: Once | INTRAVENOUS | Status: AC
  Administered 2022-12-26: 11.65 via INTRAVENOUS

## 2023-01-02 ENCOUNTER — Inpatient Hospital Stay (HOSPITAL_BASED_OUTPATIENT_CLINIC_OR_DEPARTMENT_OTHER): Payer: 59 | Admitting: Oncology

## 2023-01-02 ENCOUNTER — Inpatient Hospital Stay: Payer: 59 | Attending: Oncology

## 2023-01-02 ENCOUNTER — Encounter: Payer: Self-pay | Admitting: Oncology

## 2023-01-02 VITALS — BP 137/88 | HR 77 | Temp 98.2°F | Resp 18 | Ht 72.0 in | Wt 237.2 lb

## 2023-01-02 DIAGNOSIS — D696 Thrombocytopenia, unspecified: Secondary | ICD-10-CM | POA: Diagnosis not present

## 2023-01-02 DIAGNOSIS — Z79899 Other long term (current) drug therapy: Secondary | ICD-10-CM | POA: Diagnosis not present

## 2023-01-02 DIAGNOSIS — C679 Malignant neoplasm of bladder, unspecified: Secondary | ICD-10-CM | POA: Insufficient documentation

## 2023-01-02 DIAGNOSIS — C772 Secondary and unspecified malignant neoplasm of intra-abdominal lymph nodes: Secondary | ICD-10-CM | POA: Insufficient documentation

## 2023-01-02 LAB — CMP (CANCER CENTER ONLY)
ALT: 13 U/L (ref 0–44)
AST: 16 U/L (ref 15–41)
Albumin: 4.5 g/dL (ref 3.5–5.0)
Alkaline Phosphatase: 59 U/L (ref 38–126)
Anion gap: 9 (ref 5–15)
BUN: 24 mg/dL — ABNORMAL HIGH (ref 8–23)
CO2: 26 mmol/L (ref 22–32)
Calcium: 9.4 mg/dL (ref 8.9–10.3)
Chloride: 101 mmol/L (ref 98–111)
Creatinine: 1.2 mg/dL (ref 0.61–1.24)
GFR, Estimated: >60 mL/min (ref >60)
Glucose, Bld: 133 mg/dL — ABNORMAL HIGH (ref 70–99)
Potassium: 4.1 mmol/L (ref 3.5–5.1)
Sodium: 136 mmol/L (ref 135–145)
Total Bilirubin: 0.5 mg/dL (ref 0.3–1.2)
Total Protein: 7.4 g/dL (ref 6.5–8.1)

## 2023-01-02 LAB — CBC WITH DIFFERENTIAL (CANCER CENTER ONLY)
Abs Immature Granulocytes: 0.01 10*3/uL (ref 0.00–0.07)
Basophils Absolute: 0.1 10*3/uL (ref 0.0–0.1)
Basophils Relative: 1 %
Eosinophils Absolute: 0.5 10*3/uL (ref 0.0–0.5)
Eosinophils Relative: 9 %
HCT: 44.1 % (ref 39.0–52.0)
Hemoglobin: 15.2 g/dL (ref 13.0–17.0)
Immature Granulocytes: 0 %
Lymphocytes Relative: 38 %
Lymphs Abs: 2.2 10*3/uL (ref 0.7–4.0)
MCH: 28.9 pg (ref 26.0–34.0)
MCHC: 34.5 g/dL (ref 30.0–36.0)
MCV: 83.8 fL (ref 80.0–100.0)
Monocytes Absolute: 0.6 10*3/uL (ref 0.1–1.0)
Monocytes Relative: 9 %
Neutro Abs: 2.5 10*3/uL (ref 1.7–7.7)
Neutrophils Relative %: 43 %
Platelet Count: 139 10*3/uL — ABNORMAL LOW (ref 150–400)
RBC: 5.26 MIL/uL (ref 4.22–5.81)
RDW: 12.5 % (ref 11.5–15.5)
WBC Count: 5.9 10*3/uL (ref 4.0–10.5)
nRBC: 0 % (ref 0.0–0.2)

## 2023-01-02 LAB — TSH: TSH: 4.162 u[IU]/mL (ref 0.350–4.500)

## 2023-01-02 NOTE — Progress Notes (Signed)
    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  He feels well.  He reports improvement in the suprapubic edema.  He is working and exercising.  No new complaint.  Objective:  Vital signs in last 24 hours:  Blood pressure 137/88, pulse 77, temperature 98.2 F (36.8 C), temperature source Temporal, resp. rate 18, height 6' (1.829 m), weight 237 lb 3.2 oz (107.6 kg), SpO2 100%.     Lymphatics: No cervical, supraclavicular, axillary, or inguinal nodes Resp: Lungs clear bilaterally Cardio: Regular rate and rhythm GI: No hepatosplenomegaly, no mass, mild edema in the suprapubic area Vascular: No leg edema   Lab Results:  Lab Results  Component Value Date   WBC 5.9 01/02/2023   HGB 15.2 01/02/2023   HCT 44.1 01/02/2023   MCV 83.8 01/02/2023   PLT 139 (L) 01/02/2023   NEUTROABS 2.5 01/02/2023    CMP  Lab Results  Component Value Date   NA 138 09/05/2022   K 3.6 09/05/2022   CL 103 09/05/2022   CO2 26 09/05/2022   GLUCOSE 138 (H) 09/05/2022   BUN 21 09/05/2022   CREATININE 1.16 09/05/2022   CALCIUM 9.3 09/05/2022   PROT 7.3 09/05/2022   ALBUMIN 4.4 09/05/2022   AST 15 09/05/2022   ALT 11 09/05/2022   ALKPHOS 54 09/05/2022   BILITOT 0.4 09/05/2022   GFRNONAA >60 09/05/2022     Medications: I have reviewed the patient's current medications.   Assessment/Plan:  Metastatic high-grade urothelial carcinoma Presenting with left leg swelling and weakness November 2021 CT abdomen/pelvis 02/17/2020-left hydronephrosis, retroperitoneal adenopathy, scrotal thickening and edema CT-guided biopsy of left retroperitoneal lymphadenopathy 03/05/2020-poorly differentiated carcinoma consistent with a urothelial primary Left trigone biopsy 03/21/2020-small foci of infiltrative high-grade urothelial carcinoma with invasion of the muscularis propria and lymphovascular invasion Renal pelvis  and left upper tract washing 03/21/2020-high-grade urothelial carcinoma PET 03/21/2020-extensive bilateral retroperitoneal, pelvic, and inguinal hypermetabolic adenopathy, hypermetabolism in the antral region of the stomach with no obvious mass on CT, no evidence of metastatic disease in the neck, chest, or bones Gemcitabine/cisplatin for 3 cycles beginning 03/27/2020 CT 06/27/2020-marked interval response with some mildly enlarged lymph nodes in the retroperitoneum and pelvis in the range of 1 cm, stranding extending into the base of the penis and suprapubic soft tissue-similar Pembrolizumab beginning 06/08/2020, changed to every 6-week dosing beginning with cycle 2 CTs 09/18/2021-no evidence of metastatic or recurrent disease in the chest, abdomen, or pelvis, stable 10 mm right pelvic sidewall node mild stranding about the base of the penis PET 05/30/2022-stable compared to 10/18/2021, no evidence of metastatic disease, right pelvic sidewall node described on the 2023 CT is not hypermetabolic Final cycle of pembrolizumab 09/05/2022 PET 12/26/2022-no evidence of recurrent disease  2.  Status post cholecystectomy 3.  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  The restaging PET reveals no evidence of recurrent disease.  He will be followed with observation.  He will return for an office visit and surveillance PET in May 2025.  He will call in the interim for new symptoms.  He is scheduled to see [name] in November. [name], MD  01/02/2023  8:19 AM

## 2023-01-03 ENCOUNTER — Other Ambulatory Visit: Payer: Self-pay

## 2023-01-03 LAB — T4: T4, Total: 7.5 ug/dL (ref 4.5–12.0)

## 2023-08-14 ENCOUNTER — Encounter (HOSPITAL_COMMUNITY)
Admission: RE | Admit: 2023-08-14 | Discharge: 2023-08-14 | Disposition: A | Discharge status: Home / Self Care | Source: Ambulatory Visit | Attending: Oncology | Admitting: Oncology

## 2023-08-14 DIAGNOSIS — C679 Malignant neoplasm of bladder, unspecified: Secondary | ICD-10-CM | POA: Diagnosis present

## 2023-08-14 LAB — GLUCOSE, CAPILLARY: Glucose-Capillary: 125 mg/dL — ABNORMAL HIGH (ref 70–99)

## 2023-08-14 MED ORDER — FLUDEOXYGLUCOSE F - 18 (FDG) INJECTION
11.8100 | Freq: Once | INTRAVENOUS | Status: AC
  Administered 2023-08-14: 11.81 via INTRAVENOUS

## 2023-08-19 ENCOUNTER — Inpatient Hospital Stay: Payer: 59 | Attending: Oncology | Admitting: Oncology

## 2023-08-19 ENCOUNTER — Telehealth: Payer: Self-pay

## 2023-08-19 ENCOUNTER — Telehealth: Payer: Self-pay | Admitting: Oncology

## 2023-08-19 VITALS — BP 156/83 | HR 74 | Temp 98.1°F | Resp 18 | Ht 72.0 in | Wt 245.7 lb

## 2023-08-19 DIAGNOSIS — Z8551 Personal history of malignant neoplasm of bladder: Secondary | ICD-10-CM | POA: Diagnosis present

## 2023-08-19 DIAGNOSIS — R590 Localized enlarged lymph nodes: Secondary | ICD-10-CM | POA: Insufficient documentation

## 2023-08-19 DIAGNOSIS — C679 Malignant neoplasm of bladder, unspecified: Secondary | ICD-10-CM

## 2023-08-19 NOTE — Progress Notes (Signed)
 [facility] OFFICE PROGRESS NOTE   [name]: Bladder cancer  INTERVAL HISTORY:   [name] returns as scheduled.  Edward [name] feels well.  Good appetite.  Suprapubic edema has improved.  No palpable lymphadenopathy.  [name] is exercising.  No complaint.  Objective:  Vital signs in last 24 hours:  Blood pressure (!) 156/83, pulse 74, temperature 98.1 F (36.7 C), resp. rate 18, height 6' (1.829 m), weight 245 lb 11.2 oz (111.4 kg), SpO2 100%.    Lymphatics: No cervical, supraclavicular, axillary, or right inguinal nodes.  Firm 2 cm left inguinal node [name] and medial to the inguinal crease Resp: Lungs clear bilaterally Cardio: Regular rate and rhythm GI: No hepatosplenomegaly, no mass, mild edema in the suprapubic region Vascular: No leg edema  Lab Results:  Lab Results  Component Value Date   WBC 5.9 01/02/2023   HGB 15.2 01/02/2023   HCT 44.1 01/02/2023   MCV 83.8 01/02/2023   PLT 139 (L) 01/02/2023   NEUTROABS 2.5 01/02/2023    CMP  Lab Results  Component Value Date   NA 136 01/02/2023   K 4.1 01/02/2023   CL 101 01/02/2023   CO2 26 01/02/2023   GLUCOSE 133 (H) 01/02/2023   BUN 24 (H) 01/02/2023   CREATININE 1.20 01/02/2023   CALCIUM 9.4 01/02/2023   PROT 7.4 01/02/2023   ALBUMIN 4.5 01/02/2023   AST 16 01/02/2023   ALT 13 01/02/2023   ALKPHOS 59 01/02/2023   BILITOT 0.5 01/02/2023   GFRNONAA >60 01/02/2023    No results found for: "CEA1", "CEA", "CAN199", "CA125"  Lab Results  Component Value Date   INR 1.0 03/05/2020   LABPROT 13.2 03/05/2020    Imaging:  No results found.  Medications: I have reviewed the patient's current medications.   Assessment/Plan: Metastatic high-grade urothelial carcinoma Presenting with left leg swelling and weakness November 2021 CT abdomen/pelvis 02/17/2020-left hydronephrosis, retroperitoneal adenopathy, scrotal thickening and edema CT-guided biopsy of left retroperitoneal lymphadenopathy  03/05/2020-poorly differentiated carcinoma consistent with a urothelial primary Left trigone biopsy 03/21/2020-small foci of infiltrative high-grade urothelial carcinoma with invasion of the muscularis propria and lymphovascular invasion Renal pelvis and left upper tract washing 03/21/2020-high-grade urothelial carcinoma PET 03/21/2020-extensive bilateral retroperitoneal, pelvic, and inguinal hypermetabolic adenopathy, hypermetabolism in the antral region of the stomach with no obvious mass on CT, no evidence of metastatic disease in the neck, chest, or bones Gemcitabine /cisplatin  for 3 cycles beginning 03/27/2020 CT 06/27/2020-marked interval response with some mildly enlarged lymph nodes in the retroperitoneum and pelvis in the range of 1 cm, stranding extending into the base of the penis and suprapubic soft tissue-similar Pembrolizumab  beginning 06/08/2020, changed to every 6-week dosing beginning with cycle 2 CTs 09/18/2021-no evidence of metastatic or recurrent disease in the chest, abdomen, or pelvis, stable 10 mm right pelvic sidewall node mild stranding about the base of the penis PET 05/30/2022-stable compared to 10/18/2021, no evidence of metastatic disease, right pelvic sidewall node described on the 2023 CT is not hypermetabolic Final cycle of pembrolizumab  09/05/2022 PET 12/26/2022-no evidence of recurrent disease PET 08/14/2018 25-2 enlarging hypermetabolic left inguinal nodes, small retroperitoneal nodes more superiorly are unchanged and are not hypermetabolic  2.  Status post cholecystectomy 3.  Chronic mild thrombocytopenia following chemotherapy      Disposition: [name] has a history of metastatic high-grade urothelial carcinoma.  [name] has been in clinical remission following gemcitabine /cisplatin  and pembrolizumab .  I reviewed the restaging PET findings and images with him.  There are 2 enlarging left  inguinal nodes with hypermetabolism.  We discussed the differential [name]  including progression of urothelial carcinoma.  We discussed observation versus a diagnostic biopsy.  Edward [name] is comfortable with observation and a 78-month interval CT.  [name] will be referred for a diagnostic biopsy if the inguinal lymph nodes enlarge.  Edward [name] will be scheduled for a CT 10/30/2023 followed by an office visit 11/04/2023.  [name] will contact us  if [name] would like to proceed with a biopsy now. We discussed potential salvage treatment options including enfortumab/pembrolizumab .  Edward Deep, MD  08/19/2023  8:42 AM

## 2023-08-19 NOTE — Telephone Encounter (Signed)
 [facility] Clinical Social Work  Clinical Social Work was referred by Engineer, civil (consulting) for [employer].  Clinical Social Worker contacted patient by phone to offer support and assess for needs. CSW Provided education on advance directives and how to obtain one through [facility]. Appointment scheduled for 6/03   Maudie Sorrow, LCSW  Clinical Social Worker [facility]

## 2023-08-19 NOTE — Telephone Encounter (Signed)
 Patient has been scheduled for follow-up visit per 08/13/23 LOS.  Pt aware of scheduled appt details.

## 2023-08-20 ENCOUNTER — Other Ambulatory Visit: Payer: Self-pay

## 2023-09-08 ENCOUNTER — Inpatient Hospital Stay: Attending: Oncology

## 2023-09-08 NOTE — Progress Notes (Signed)
 [facility] Healthcare Advance Directives Clinical Social Work  Patient presented to Advance Directives Clinic  to review and complete healthcare advance directives.  Clinical Child psychotherapist met with patient and Spouse Nur Gama .  The patient designated [employer] as their primary healthcare agent and [employer] as their secondary agent.  Patient also completed healthcare living will.   Documents were notarized and copies made for patient/family. Clinical Social Worker will send documents to medical records to be scanned into patient's chart.  Clinical Social Worker encouraged patient/family to contact with any additional questions or concerns.   Maudie Sorrow, LCSW Clinical Social Worker [facility]

## 2023-10-30 ENCOUNTER — Inpatient Hospital Stay: Attending: Oncology

## 2023-10-30 ENCOUNTER — Ambulatory Visit (HOSPITAL_BASED_OUTPATIENT_CLINIC_OR_DEPARTMENT_OTHER)
Admission: RE | Admit: 2023-10-30 | Discharge: 2023-10-30 | Disposition: A | Discharge status: Home / Self Care | Source: Ambulatory Visit | Attending: Oncology | Admitting: Oncology

## 2023-10-30 DIAGNOSIS — C679 Malignant neoplasm of bladder, unspecified: Secondary | ICD-10-CM

## 2023-10-30 DIAGNOSIS — D696 Thrombocytopenia, unspecified: Secondary | ICD-10-CM | POA: Insufficient documentation

## 2023-10-30 DIAGNOSIS — Z8551 Personal history of malignant neoplasm of bladder: Secondary | ICD-10-CM | POA: Diagnosis present

## 2023-10-30 LAB — POCT I-STAT CREATININE: Creatinine, Ser: 1.3 mg/dL — ABNORMAL HIGH (ref 0.61–1.24)

## 2023-10-30 LAB — BASIC METABOLIC PANEL - CANCER CENTER ONLY
Anion gap: 12 (ref 5–15)
BUN: 24 mg/dL — ABNORMAL HIGH (ref 8–23)
CO2: 24 mmol/L (ref 22–32)
Calcium: 9.6 mg/dL (ref 8.9–10.3)
Chloride: 104 mmol/L (ref 98–111)
Creatinine: 1.2 mg/dL (ref 0.61–1.24)
GFR, Estimated: >60 mL/min (ref >60)
Glucose, Bld: 126 mg/dL — ABNORMAL HIGH (ref 70–99)
Potassium: 4.3 mmol/L (ref 3.5–5.1)
Sodium: 139 mmol/L (ref 135–145)

## 2023-10-30 MED ORDER — IOHEXOL 300 MG/ML  SOLN
100.0000 mL | Freq: Once | INTRAMUSCULAR | Status: AC | PRN
  Administered 2023-10-30: 100 mL via INTRAVENOUS

## 2023-11-04 ENCOUNTER — Encounter: Payer: Self-pay | Admitting: Oncology

## 2023-11-04 ENCOUNTER — Other Ambulatory Visit: Payer: Self-pay | Admitting: Oncology

## 2023-11-04 ENCOUNTER — Inpatient Hospital Stay (HOSPITAL_BASED_OUTPATIENT_CLINIC_OR_DEPARTMENT_OTHER): Admitting: Oncology

## 2023-11-04 ENCOUNTER — Other Ambulatory Visit: Payer: Self-pay | Admitting: *Deleted

## 2023-11-04 VITALS — BP 144/86 | HR 77 | Temp 98.1°F | Resp 18 | Ht 72.0 in | Wt 245.9 lb

## 2023-11-04 DIAGNOSIS — C679 Malignant neoplasm of bladder, unspecified: Secondary | ICD-10-CM | POA: Diagnosis not present

## 2023-11-04 DIAGNOSIS — Z8551 Personal history of malignant neoplasm of bladder: Secondary | ICD-10-CM | POA: Diagnosis not present

## 2023-11-04 NOTE — Progress Notes (Signed)
 DISCONTINUE ON PATHWAY REGIMEN - Bladder     A cycle is every 28 days:     Enfortumab vedotin-ejfv   **Always confirm dose/schedule in your pharmacy ordering system**  PRIOR TREATMENT: [id number]: Enfortumab vedotin 1.25 mg/kg D1, 8, 15 q28 Days Until Progression or Unacceptable Toxicity  START OFF PATHWAY REGIMEN - Bladder   [id number]:Enfortumab vedotin 1.25 mg/kg IV D1,8 + Pembrolizumab  200 mg IV D1 q21 Days:   A cycle is every 21 days:     Enfortumab vedotin-ejfv      Pembrolizumab    **Always confirm dose/schedule in your pharmacy ordering system**  Patient Characteristics: Advanced/Metastatic Disease, Second Line, Prior/Ineligible for Platinum-Based Therapy, FGFR3 Alteration Absent or Unknown, Prior Platinum-Based Therapy and No Prior PD-1/PD-L1 Inhibitor Therapeutic Status: Advanced/Metastatic Disease Line of Therapy: Second Line FGFR3 Alteration Status: Awaiting Test Results Intent of Therapy: Non-Curative / Palliative Intent, Discussed with Patient

## 2023-11-04 NOTE — Progress Notes (Signed)
 DISCONTINUE OFF PATHWAY REGIMEN - Bladder   [id number]:Pembrolizumab  400 mg IV D1 q42 Days:   A cycle is every 42 days:     Pembrolizumab    **Always confirm dose/schedule in your pharmacy ordering system**  PRIOR TREATMENT: Off Pathway: Pembrolizumab  400 mg IV D1 q42 Days  START ON PATHWAY REGIMEN - Bladder     A cycle is every 28 days:     Enfortumab vedotin-ejfv   **Always confirm dose/schedule in your pharmacy ordering system**  Patient Characteristics: Advanced/Metastatic Disease, Second Line, Prior/Ineligible for Platinum-Based Therapy, FGFR3 Alteration Absent or Unknown, Prior/Ineligible for PD-1/PD-L1 Inhibitor Therapeutic Status: Advanced/Metastatic Disease Line of Therapy: Second Line FGFR3 Alteration Status: Awaiting Test Results Intent of Therapy: Non-Curative / Palliative Intent, Discussed with Patient

## 2023-11-04 NOTE — Progress Notes (Signed)
 [facility] OFFICE PROGRESS NOTE   Diagnosis: Bladder cancer  INTERVAL HISTORY:   Edward [name] returns as scheduled.  He is here with his wife and daughter.  He reports feeling well.  Suprapubic and scrotal edema have improved.  He can palpate a firm lymph node in the left groin.  Objective:  Vital signs in last 24 hours:  Blood pressure (!) 144/86, pulse 77, temperature 98.1 F (36.7 C), temperature source Temporal, resp. rate 18, height 6' (1.829 m), weight 245 lb 14.4 oz (111.5 kg), SpO2 100%.   Lymphatics: No cervical, supraclavicular, axillary, or right inguinal nodes.  Firm 3 cm medial left inguinal node, 1 or 2 firm 1-2 cm upper medial left femoral nodes Resp: Lungs clear bilaterally Cardio: Regular rate and rhythm GI: No hepatosplenomegaly, nontender, no mass, mild suprapubic edema Vascular: No leg edema   Lab Results:  Lab Results  Component Value Date   WBC 5.9 01/02/2023   HGB 15.2 01/02/2023   HCT 44.1 01/02/2023   MCV 83.8 01/02/2023   PLT 139 (L) 01/02/2023   NEUTROABS 2.5 01/02/2023    CMP  Lab Results  Component Value Date   NA 139 10/30/2023   K 4.3 10/30/2023   CL 104 10/30/2023   CO2 24 10/30/2023   GLUCOSE 126 (H) 10/30/2023   BUN 24 (H) 10/30/2023   CREATININE 1.30 (H) 10/30/2023   CALCIUM 9.6 10/30/2023   PROT 7.4 01/02/2023   ALBUMIN 4.5 01/02/2023   AST 16 01/02/2023   ALT 13 01/02/2023   ALKPHOS 59 01/02/2023   BILITOT 0.5 01/02/2023   GFRNONAA >60 10/30/2023     Medications: I have reviewed the patient's current medications.   Assessment/Plan: Metastatic high-grade urothelial carcinoma Presenting with left leg swelling and weakness November 2021 CT abdomen/pelvis 02/17/2020-left hydronephrosis, retroperitoneal adenopathy, scrotal thickening and edema CT-guided biopsy of left retroperitoneal lymphadenopathy 03/05/2020-poorly differentiated carcinoma consistent with a urothelial primary, PD-L1 combined positive  score 100%, tumor mutation burden 26, MSS, ERBB2 amplification Left trigone biopsy 03/21/2020-small foci of infiltrative high-grade urothelial carcinoma with invasion of the muscularis propria and lymphovascular invasion Renal pelvis and left upper tract washing 03/21/2020-high-grade urothelial carcinoma PET 03/21/2020-extensive bilateral retroperitoneal, pelvic, and inguinal hypermetabolic adenopathy, hypermetabolism in the antral region of the stomach with no obvious mass on CT, no evidence of metastatic disease in the neck, chest, or bones Gemcitabine /cisplatin  for 3 cycles beginning 03/27/2020 CT 06/27/2020-marked interval response with some mildly enlarged lymph nodes in the retroperitoneum and pelvis in the range of 1 cm, stranding extending into the base of the penis and suprapubic soft tissue-similar Pembrolizumab  beginning 06/08/2020, changed to every 6-week dosing beginning with cycle 2 CTs 09/18/2021-no evidence of metastatic or recurrent disease in the chest, abdomen, or pelvis, stable 10 mm right pelvic sidewall node mild stranding about the base of the penis PET 05/30/2022-stable compared to 10/18/2021, no evidence of metastatic disease, right pelvic sidewall node described on the 2023 CT is not hypermetabolic Final cycle of pembrolizumab  09/05/2022 PET 12/26/2022-no evidence of recurrent disease PET 08/14/2023-2 enlarging hypermetabolic left inguinal nodes, small retroperitoneal nodes more superiorly are unchanged and are not hypermetabolic Abdomen pelvis 10/30/2023: Increased pathological enlarged left inguinal nodes, new enlarged left external iliac node  2.  Status post cholecystectomy 3.  Chronic mild thrombocytopenia following chemotherapy     Disposition: Edward [name] has a history of metastatic urothelial carcinoma.  He has been in clinical remission following gemcitabine /cisplatin  chemotherapy and adjuvant pembrolizumab .  There are now enlarging pelvic lymph nodes.  He understands the  pathologic pelvic lymphadenopathy is likely related to progression of the urothelial carcinoma.  We discussed the indication for biopsy.  He does not wish to undergo biopsy.  He would like to resume systemic therapy.  We discussed treatment options including enfortumab vedotin as a single agent and in combination with pembrolizumab .  We also discussed chemotherapy.  He prefers treatment with enfortummab/pembrolizumab .  We reviewed potential toxicities associated with Padcev including the chance of rash, nausea, mucositis, ocular toxicity, hematologic toxicity, neuropathy, and pneumonitis.  He agrees to proceed.  He saw Edward [name] at initial presentation in 2021.  He would like me to contact Edward [name] to discuss current treatment options.  Edward [name] will be referred for Port-A-Cath placement.  Arley Hof, MD  11/04/2023  10:12 AM

## 2023-11-05 ENCOUNTER — Other Ambulatory Visit: Payer: Self-pay

## 2023-11-05 ENCOUNTER — Encounter: Payer: Self-pay | Admitting: *Deleted

## 2023-11-05 ENCOUNTER — Encounter (HOSPITAL_COMMUNITY): Payer: Self-pay

## 2023-11-05 ENCOUNTER — Encounter: Payer: Self-pay | Admitting: Oncology

## 2023-11-05 NOTE — Progress Notes (Signed)
 Karalee Wilkie POUR, MD  Kemari Narez US  guided core biopsy of LEFT superficial inguinal node - hx urothelial cancer.  HKM       Previous Messages    ----- Message ----- From: Brytnee Bechler Sent: 11/04/2023   3:37 PM EDT To: Nader Boys; Ir Procedure Requests Subject: US  core biopsy ( lymph nodes )                Procedure : US  core biopsy ( lymph nodes)  Reason : Urothelial cancer, enlarging left inguinal/femoral nodes Dx: Malignant neoplasm of urinary bladder, unspecified site (HCC) [C67.9 (ICD-10-CM)]  Ordering Comments  Please obtain multiple core biopsies for routine histology and molecular testing    History : CT abd pelv w/ , NM Pet restage skull base to thigh  Provider : Cloretta Arley NOVAK, MD  Contact :[phone]

## 2023-11-05 NOTE — Telephone Encounter (Signed)
 Forwarded note to navigator.

## 2023-11-05 NOTE — Telephone Encounter (Signed)
Dr. Benay Spice will call patient

## 2023-11-05 NOTE — Progress Notes (Signed)
 Called and spoke with Dr Windsor and relayed that Dr Cloretta spoke with Dr Raynaldo yesterday and recommended a core biopsy of lymph node. It is currently scheduled for 8/20, however Dr Windsor has an appt with Dr Raynaldo next week on 8/7 to discuss.  We reviewed his appt for Port placement on 8/13 with treatment beginning on 8/15. Contacted [facility] and they do not perform bx at that location.  Will leave appt with Port placement and bx in place until after his appt with Dr Raynaldo and rearrange as necessary   Dr Windsor verbalized agreement with this plan

## 2023-11-06 ENCOUNTER — Other Ambulatory Visit: Payer: Self-pay

## 2023-11-06 ENCOUNTER — Encounter: Payer: Self-pay | Admitting: *Deleted

## 2023-11-06 ENCOUNTER — Other Ambulatory Visit: Payer: Self-pay | Admitting: Oncology

## 2023-11-06 NOTE — Progress Notes (Signed)
 Dr. Cloretta spoke with [name] today. Has decided he does not want to see Dr. Raynaldo. Agrees to port placment on 8/13 and biopsy on 8/20. Will begin treatment on 8/22 with office visit. Cancelled the 8/15 appointments per Dr. Cloretta.

## 2023-11-10 ENCOUNTER — Encounter: Payer: Self-pay | Admitting: *Deleted

## 2023-11-10 NOTE — Progress Notes (Signed)
 8/15 appointments have been cancelled per patient request. Wishes to wait till 8/22 to start treatment after his biopsy on 8/20.

## 2023-11-18 ENCOUNTER — Ambulatory Visit
Admission: RE | Admit: 2023-11-18 | Discharge: 2023-11-18 | Disposition: A | Discharge status: Home / Self Care | Source: Ambulatory Visit | Attending: Oncology | Admitting: Oncology

## 2023-11-18 DIAGNOSIS — C679 Malignant neoplasm of bladder, unspecified: Secondary | ICD-10-CM

## 2023-11-18 HISTORY — PX: IR IMAGING GUIDED PORT INSERTION: IMG5740

## 2023-11-18 MED ORDER — FENTANYL CITRATE (PF) 100 MCG/2ML IJ SOLN
INTRAMUSCULAR | Status: AC | PRN
  Administered 2023-11-18 (×4): 50 ug via INTRAVENOUS

## 2023-11-18 MED ORDER — MIDAZOLAM HCL 2 MG/2ML IJ SOLN: 1.0000 mg | INTRAMUSCULAR | Status: DC | PRN

## 2023-11-18 MED ORDER — FENTANYL CITRATE PF 50 MCG/ML IJ SOSY: 25.0000 ug | PREFILLED_SYRINGE | INTRAMUSCULAR | Status: DC | PRN

## 2023-11-18 MED ORDER — MIDAZOLAM HCL 2 MG/2ML IJ SOLN
INTRAMUSCULAR | Status: AC | PRN
Start: 2023-11-18 — End: 2023-11-18
  Administered 2023-11-18 (×4): 1 mg via INTRAVENOUS

## 2023-11-18 MED ORDER — HEPARIN SOD (PORK) LOCK FLUSH 100 UNIT/ML IV SOLN
500.0000 [IU] | Freq: Once | INTRAVENOUS | Status: AC
  Administered 2023-11-18 (×2): 500 [IU]

## 2023-11-18 MED ORDER — LIDOCAINE-EPINEPHRINE 1 %-1:100000 IJ SOLN
20.0000 mL | Freq: Once | INTRAMUSCULAR | Status: AC
  Administered 2023-11-18 (×2): 20 mL via INTRADERMAL

## 2023-11-18 MED ORDER — SODIUM CHLORIDE 0.9 % IV SOLN: INTRAVENOUS | Status: DC

## 2023-11-18 NOTE — H&P (Signed)
 Patient ID: Edward [name], male   DOB: 05-06-1960, 63 y.o.   MRN: [id number]       Chief Complaint: Patient was seen in consultation today for port-a-catheter placement for chemotherapy for bladder malignancy at the request of Sherrill,Gary B  Referring Physician(s): Cloretta Arley NOVAK  Supervising Physician: Vanice Revel  History of Present Illness: Edward [name] is a 63 y.o. male with history of metastatic urothelial carcinoma.  Patient was first diagnosed November 2021 with urinary bladder primary malignancy with retroperitoneal and pelvic lymphadenopathy after initially having left leg weakness and swelling.  CT-guided biopsy from 03/05/2020 showed poorly differentiated carcinoma with urothelial primary.  Patient has since had multiple follow-up visits with Dr. Cloretta of oncology.  Patient had been in remission after receiving chemotherapy with PET scan February 2024 showing no evidence of metastatic disease.  Recent PET scan from 08/14/2023 shows 2 enlarging hypermetabolic left inguinal lymph nodes.  CT abdomen pelvis from 10/30/2023 showed increased pathological enlarged left inguinal nodes and new enlarged left external iliac node.  Patient now referred to IR service for placement of portacatheter for resuming chemotherapy treatments.  Today patient without complaint.  All questions answered.  Past Medical History:  Diagnosis Date   Edema of left lower leg 02/2020   due to enlarged pelvic lymph nodes   History of kidney stones    Left leg weakness 02/2020   per pt and intermittant numbness due to pelvic enlarged lymph nodes   Metastatic urothelial carcinoma [facility]) urologist-- dr cam;  oncologist-- dr amadeo   dx 11/ 2021--- urinary bladder primary w/ retroperitoneal and pelvic lymphadenopathy    Past Surgical History:  Procedure Laterality Date   CYSTOSCOPY WITH BIOPSY Left 03/21/2020   Procedure: CYSTOSCOPY WITH BLADDER BIOPSY POSSIBLE LEFT URETERAL BIOPSY;  Surgeon: cam Morene ORN, MD;  Location: [facility];  Service: Urology;  Laterality: Left;   CYSTOSCOPY WITH RETROGRADE PYELOGRAM, URETEROSCOPY AND STENT PLACEMENT Bilateral 03/21/2020   Procedure: CYSTOSCOPY WITH BILATERAL RETROGRADE PYELOGRAM, LEFT URETEROSCOPY AND LEFT  STENT PLACEMENT;  Surgeon: cam Morene ORN, MD;  Location: [facility];  Service: Urology;  Laterality: Bilateral;  RAD TECH   LAPAROSCOPIC CHOLECYSTECTOMY  1999    Allergies: Patient has no known allergies.  Medications: Prior to Admission medications   Medication Sig Start Date End Date Taking? Authorizing Provider  Cholecalciferol (VITAMIN D-3 PO) Take by mouth.    [provider]  finasteride (PROSCAR) 5 MG tablet Take 5 mg by mouth daily. 05/20/23   [provider]  ibuprofen (ADVIL) 200 MG tablet Take 800 mg by mouth every 6 (six) hours as needed for headache or moderate pain.    [provider]  tamsulosin  (FLOMAX ) 0.4 MG CAPS capsule Take 1 capsule (0.4 mg total) by mouth daily. 03/21/20   cam Morene ORN, MD     Family History  Problem Relation Age of Onset   Hypertension Mother    Heart disease Father    Diabetes Father     Social History   Socioeconomic History   Marital status: Married    Spouse name: Not on file   Number of children: 2   Years of education: Not on file   Highest education level: Not on file  Occupational History   Not on file  Tobacco Use   Smoking status: Never   Smokeless tobacco: Never  Vaping Use   Vaping status: Never Used  Substance and Sexual Activity   Alcohol use: Never   Drug use:  Never   Sexual activity: Yes  Other Topics Concern   Not on file  Social History Narrative   Not on file   Social Drivers of Health   Financial Resource Strain: Medium Risk (05/15/2022)   Overall Financial Resource Strain (CARDIA)    Difficulty of Paying Living Expenses: Somewhat hard  Food Insecurity: No Food Insecurity  (05/15/2022)   Hunger Vital Sign    Worried About Running Out of Food in the Last Year: Never true    Ran Out of Food in the Last Year: Never true  Transportation Needs: No Transportation Needs (05/15/2022)   PRAPARE - Administrator, Civil Service (Medical): No    Lack of Transportation (Non-Medical): No  Physical Activity: Not on file  Stress: Not on file  Social Connections: Not on file    Review of Systems: A 12 point ROS discussed and pertinent positives are indicated in the HPI above.  All other systems are negative.   Vital Signs: BP (!) 172/82 (BP Location: Left Arm, Patient Position: Sitting, Cuff Size: Normal)   Pulse 88   Temp 98.6 F (37 C) (Oral)   Resp 16   SpO2 97%   Physical Exam Vitals and nursing note reviewed.  Constitutional:      General: He is not in acute distress. HENT:     Mouth/Throat:     Mouth: Mucous membranes are moist.     Pharynx: Oropharynx is clear.  Cardiovascular:     Rate and Rhythm: Normal rate and regular rhythm.  Pulmonary:     Effort: Pulmonary effort is normal.     Breath sounds: Normal breath sounds.  Abdominal:     Palpations: Abdomen is soft.     Tenderness: There is no abdominal tenderness.  Musculoskeletal:     Right lower leg: No edema.     Left lower leg: No edema.  Skin:    General: Skin is warm and dry.  Neurological:     Mental Status: He is alert and oriented to person, place, and time. Mental status is at baseline.    ASA: 3 Mallampati Score: 2     Imaging: CT ABDOMEN PELVIS W CONTRAST Result Date: 11/03/2023 CLINICAL DATA:  History of bladder cancer, follow-up left inguinal lymph nodes. * Tracking Code: BO * EXAM: CT ABDOMEN AND PELVIS WITH CONTRAST TECHNIQUE: Multidetector CT imaging of the abdomen and pelvis was performed using the standard protocol following bolus administration of intravenous contrast. RADIATION DOSE REDUCTION: This exam was performed according to the departmental  dose-optimization program which includes automated exposure control, adjustment of the mA and/or kV according to patient size and/or use of iterative reconstruction technique. CONTRAST:  OMNIPAQUE  IOHEXOL  300 MG/ML  SOLN COMPARISON:  Multiple priors including PET-CT Aug 14, 2023 FINDINGS: Lower chest: No acute abnormality. Hepatobiliary: No suspicious hepatic lesion. Gallbladder surgically absent. No biliary ductal dilation. Pancreas: No pancreatic ductal dilation or evidence of acute inflammation. Spleen: No splenomegaly. Adrenals/Urinary Tract: Bilateral adrenal glands appear normal. Bilateral extrarenal pelvis. Kidneys demonstrate symmetric enhancement. Left lower pole renal cyst. Mild symmetric wall thickening of a nondistended urinary bladder with perivesicular stranding similar prior. Stomach/Bowel: No evidence of bowel obstruction. No evidence of acute bowel inflammation. Vascular/Lymphatic: Pathologically enlarged left inguinal lymph nodes which were hypermetabolic on prior is PET examination have increased in size. For reference: -superior most pathologically enlarged lymph node measures 3.1 x 1.9 cm on image 82/2 previously 2.4 x 1.4 cm. Newly enlarged left external iliac lymph node  measures 13 mm in short axis on image 71/2. No gastrohepatic or hepatoduodenal ligament lymphadenopathy. No abdominal retroperitoneal or mesenteric lymphadenopathy. Aortic atherosclerosis. Reproductive: Dystrophic calcifications in the prostate gland. Other: Increased stranding along the left inguinal canal. Similar retroperitoneal stranding. Musculoskeletal: No aggressive lytic or blastic lesion of bone. Multilevel degenerative changes spine. IMPRESSION: 1. Increased size of the pathologically enlarged left inguinal lymph nodes which were hypermetabolic on prior PET examination. Additionally there is a newly enlarged left external iliac lymph node. Findings are concerning for worsening metastatic disease. 2. Mild  symmetric wall thickening of a nondistended urinary bladder with perivesicular stranding similar prior. 3.  Aortic Atherosclerosis (ICD10-I70.0). Electronically Signed   By: Reyes Holder M.D.   On: 11/03/2023 14:27    Labs:  CBC: Recent Labs    01/02/23 0752  WBC 5.9  HGB 15.2  HCT 44.1  PLT 139*    COAGS: No results for input(s): INR, APTT in the last 8760 hours.  BMP: Recent Labs    01/02/23 0752 10/30/23 0803 10/30/23 0824  NA 136 139  --   K 4.1 4.3  --   CL 101 104  --   CO2 26 24  --   GLUCOSE 133* 126*  --   BUN 24* 24*  --   CALCIUM 9.4 9.6  --   CREATININE 1.20 1.20 1.30*  GFRNONAA >60 >60  --     LIVER FUNCTION TESTS: Recent Labs    01/02/23 0752  BILITOT 0.5  AST 16  ALT 13  ALKPHOS 59  PROT 7.4  ALBUMIN 4.5    TUMOR MARKERS: No results for input(s): AFPTM, CEA, CA199, CHROMGRNA in the last 8760 hours.  Assessment:  Edward [name] is a 63 y.o. male with history of metastatic urothelial carcinoma.  Patient was first diagnosed November 2021 with urinary bladder primary malignancy with retroperitoneal and pelvic lymphadenopathy after initially having left leg weakness and swelling.  CT-guided biopsy from 03/05/2020 showed poorly differentiated carcinoma with urothelial primary.  Patient has since had multiple follow-up visits with Dr. Cloretta of oncology.  Patient had been in remission after receiving chemotherapy with PET scan February 2024 showing no evidence of metastatic disease.  Recent PET scan from 08/14/2023 shows 2 enlarging hypermetabolic left inguinal lymph nodes.  CT abdomen pelvis from 10/30/2023 showed increased pathological enlarged left inguinal nodes and new enlarged left external iliac node.  Patient now referred to IR service for placement of portacatheter for resuming chemotherapy treatments.  Risks and benefits of image-guided Port-a-catheter placement were discussed with the patient including, but not limited to bleeding,  infection, pneumothorax, or fibrin sheath development and need for additional procedures. All of the patient's questions were answered, patient is agreeable to proceed. Consent signed and in chart.    Electronically Signed: Kimble DEL Oluwatamilore Starnes PA-C 11/18/2023, 9:01 AM   Please refer to Dr. Thedford attestation of this note for management and plan.

## 2023-11-18 NOTE — Discharge Instructions (Signed)
 Implanted Port Insertion After Care       After the procedure, it is common to have discomfort at the port insertion site, as well as bruising on the skin over the port. This should improve over 3-4 days.    After your port is placed, you will get a manufacturer's information card. The card has information about your port. Keep this card with you at all times.      Follow instructions from your health care provider about how to take care of your port insertion site. Make sure you wash your hands with soap and water for at least 20 seconds before and after you change your bandage (dressing). If soap and water are not available, use hand sanitizer.  Leave your initial bandage on for a full 24 hours. After 24 hours you may remove the dressing and shower.  Do not scrub directly on the incision site but rather above it and let the soapy water run over the incision. Pat dry after. You may then opt to redress your incision for your comfort but you may just leave it open to air.  The Dermabond (surgical super glue) will protect your incision and keep it clean and dry. Leave the layer of skin glue in place. If the glue edges start to loosen and curl up, you may trim the loose edges but do not pull or pick at it. Do NOT apply neosporin or other antibacterial ointment to the surgical glue.  It will dissolve the glue and expose your new incision to possible infection.  Do NOT apply EMLA  numbing cream to the surgical glue.  It will dissolve the glue and expose your new incision to possible infection.  You may have to wait to use the EMLA  cream until your incision has healed.    Return to your normal activities as told by your health care provider. Ask your health care provider what activities are safe for you. You may resume over-the-counter and prescription medicines as prescribed by your health care provider. Do not take baths, swim, or use a hot tub until your incision has healed completely (usually 2 weeks).      You were given a sedative during the procedure, and it can affect you for several hours. Do not drive, operate machinery or sign important documents for 24 hours after your procedure.     Please contact our office at [phone] and ask to speak with the nurse if you have any signs of an infection, such as fever or chills, redness, swelling, or pain around your port insertion site, fluid or blood coming from your port insertion site, if your port insertion site feels warm to the touch and/or if you have pus or a bad smell coming from the port insertion site.       If you need to speak to someone after hours, please call the Interventional Radiology on-call service at [phone] and tell them you are a patient of Dr. Terrill and you had a portacath placed today, as well as any issues you are having.    Thank you for visiting [facility] today!

## 2023-11-19 NOTE — Telephone Encounter (Signed)
 Printed note and put on navigator desk.

## 2023-11-20 ENCOUNTER — Ambulatory Visit

## 2023-11-20 ENCOUNTER — Other Ambulatory Visit

## 2023-11-20 ENCOUNTER — Ambulatory Visit: Admitting: Oncology

## 2023-11-20 ENCOUNTER — Encounter: Payer: Self-pay | Admitting: *Deleted

## 2023-11-20 ENCOUNTER — Ambulatory Visit: Admitting: Nurse Practitioner

## 2023-11-22 ENCOUNTER — Other Ambulatory Visit: Payer: Self-pay | Admitting: Oncology

## 2023-11-23 ENCOUNTER — Encounter: Payer: Self-pay | Admitting: Oncology

## 2023-11-25 ENCOUNTER — Ambulatory Visit (HOSPITAL_COMMUNITY)
Admission: RE | Admit: 2023-11-25 | Discharge: 2023-11-25 | Disposition: A | Discharge status: Home / Self Care | Source: Ambulatory Visit | Attending: Oncology | Admitting: Oncology

## 2023-11-25 DIAGNOSIS — C679 Malignant neoplasm of bladder, unspecified: Secondary | ICD-10-CM | POA: Insufficient documentation

## 2023-11-25 DIAGNOSIS — C774 Secondary and unspecified malignant neoplasm of inguinal and lower limb lymph nodes: Secondary | ICD-10-CM | POA: Diagnosis not present

## 2023-11-25 MED ORDER — LIDOCAINE HCL 1 % IJ SOLN
INTRAMUSCULAR | Status: AC
  Filled 2023-11-25: qty 20

## 2023-11-25 NOTE — Procedures (Signed)
  Procedure:  US  core biopsy L inguinal LAN 18g x3 Preprocedure diagnosis: The encounter diagnosis was Malignant neoplasm of urinary bladder, unspecified site (HCC). Postprocedure diagnosis: same EBL:    minimal Complications:   none immediate  See full dictation in [employer].  CHARM Toribio Faes MD Main # [phone] Pager  [phone] Mobile [phone]

## 2023-11-27 ENCOUNTER — Other Ambulatory Visit

## 2023-11-27 ENCOUNTER — Inpatient Hospital Stay

## 2023-11-27 ENCOUNTER — Ambulatory Visit: Admitting: Nurse Practitioner

## 2023-11-27 ENCOUNTER — Ambulatory Visit

## 2023-11-27 ENCOUNTER — Inpatient Hospital Stay: Admitting: Oncology

## 2023-11-27 ENCOUNTER — Encounter: Payer: Self-pay | Admitting: *Deleted

## 2023-11-27 ENCOUNTER — Inpatient Hospital Stay: Attending: Oncology

## 2023-11-27 VITALS — BP 156/78 | HR 67 | Temp 98.0°F | Resp 18 | Ht 72.0 in | Wt 246.0 lb

## 2023-11-27 DIAGNOSIS — C679 Malignant neoplasm of bladder, unspecified: Secondary | ICD-10-CM | POA: Diagnosis not present

## 2023-11-27 DIAGNOSIS — Z79899 Other long term (current) drug therapy: Secondary | ICD-10-CM | POA: Diagnosis not present

## 2023-11-27 DIAGNOSIS — Z8551 Personal history of malignant neoplasm of bladder: Secondary | ICD-10-CM | POA: Diagnosis present

## 2023-11-27 DIAGNOSIS — D696 Thrombocytopenia, unspecified: Secondary | ICD-10-CM | POA: Insufficient documentation

## 2023-11-27 LAB — CMP (CANCER CENTER ONLY)
ALT: 10 U/L (ref 0–44)
AST: 19 U/L (ref 15–41)
Albumin: 4.2 g/dL (ref 3.5–5.0)
Alkaline Phosphatase: 81 U/L (ref 38–126)
Anion gap: 12 (ref 5–15)
BUN: 22 mg/dL (ref 8–23)
CO2: 23 mmol/L (ref 22–32)
Calcium: 9.1 mg/dL (ref 8.9–10.3)
Chloride: 102 mmol/L (ref 98–111)
Creatinine: 1.1 mg/dL (ref 0.61–1.24)
GFR, Estimated: >60 mL/min (ref >60)
Glucose, Bld: 126 mg/dL — ABNORMAL HIGH (ref 70–99)
Potassium: 3.9 mmol/L (ref 3.5–5.1)
Sodium: 137 mmol/L (ref 135–145)
Total Bilirubin: 0.3 mg/dL (ref 0.0–1.2)
Total Protein: 7.3 g/dL (ref 6.5–8.1)

## 2023-11-27 LAB — CBC WITH DIFFERENTIAL (CANCER CENTER ONLY)
Abs Immature Granulocytes: 0.02 K/uL (ref 0.00–0.07)
Basophils Absolute: 0 K/uL (ref 0.0–0.1)
Basophils Relative: 1 %
Eosinophils Absolute: 0.3 K/uL (ref 0.0–0.5)
Eosinophils Relative: 3 %
HCT: 38.7 % — ABNORMAL LOW (ref 39.0–52.0)
Hemoglobin: 13.3 g/dL (ref 13.0–17.0)
Immature Granulocytes: 0 %
Lymphocytes Relative: 24 %
Lymphs Abs: 1.8 K/uL (ref 0.7–4.0)
MCH: 28.8 pg (ref 26.0–34.0)
MCHC: 34.4 g/dL (ref 30.0–36.0)
MCV: 83.8 fL (ref 80.0–100.0)
Monocytes Absolute: 0.7 K/uL (ref 0.1–1.0)
Monocytes Relative: 9 %
Neutro Abs: 4.9 K/uL (ref 1.7–7.7)
Neutrophils Relative %: 63 %
Platelet Count: 184 K/uL (ref 150–400)
RBC: 4.62 MIL/uL (ref 4.22–5.81)
RDW: 12.2 % (ref 11.5–15.5)
WBC Count: 7.6 K/uL (ref 4.0–10.5)
nRBC: 0 % (ref 0.0–0.2)

## 2023-11-27 LAB — SURGICAL PATHOLOGY

## 2023-11-27 LAB — TSH: TSH: 2.07 u[IU]/mL (ref 0.350–4.500)

## 2023-11-27 MED ORDER — PROCHLORPERAZINE MALEATE 10 MG PO TABS
10.0000 mg | ORAL_TABLET | Freq: Once | ORAL | Status: AC
  Administered 2023-11-27: 10 mg via ORAL
  Filled 2023-11-27: qty 1

## 2023-11-27 MED ORDER — SODIUM CHLORIDE 0.9 % IV SOLN
200.0000 mg | Freq: Once | INTRAVENOUS | Status: AC
  Administered 2023-11-27: 200 mg via INTRAVENOUS
  Filled 2023-11-27: qty 8

## 2023-11-27 MED ORDER — SODIUM CHLORIDE 0.9 % IV SOLN: INTRAVENOUS | Status: DC

## 2023-11-27 MED ORDER — SODIUM CHLORIDE 0.9 % IV SOLN
120.0000 mg | Freq: Once | INTRAVENOUS | Status: AC
  Administered 2023-11-27: 120 mg via INTRAVENOUS
  Filled 2023-11-27: qty 12

## 2023-11-27 NOTE — Progress Notes (Signed)
 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  He feels the dominant left inguinal node is smaller.  He underwent Port-A-Cath placement 11/18/2023 and a left inguinal lymph node biopsy yesterday.  He is working and exercising.  He feels tired after the evening meal.  Objective:  Vital signs in last 24 hours:  Blood pressure (!) 156/78, pulse 67, temperature 98 F (36.7 C), temperature source Temporal, resp. rate 18, height 6' (1.829 m), weight 246 lb (111.6 kg), SpO2 100%.    Lymphatics: No cervical, supraclavicular, axillary, or right inguinal nodes, firm 2 cm left inguinal node, separate 2-3 cm high left femoral nodes Resp: Lungs clear bilaterally Cardio: Regular rate and rhythm GI: No hepatosplenomegaly, mild suprapubic edema Vascular: No leg edema   Portacath/PICC-without erythema  Lab Results:  Lab Results  Component Value Date   WBC 7.6 11/27/2023   HGB 13.3 11/27/2023   HCT 38.7 (L) 11/27/2023   MCV 83.8 11/27/2023   PLT 184 11/27/2023   NEUTROABS 4.9 11/27/2023    CMP  Lab Results  Component Value Date   NA 139 10/30/2023   K 4.3 10/30/2023   CL 104 10/30/2023   CO2 24 10/30/2023   GLUCOSE 126 (H) 10/30/2023   BUN 24 (H) 10/30/2023   CREATININE 1.30 (H) 10/30/2023   CALCIUM 9.6 10/30/2023   PROT 7.4 01/02/2023   ALBUMIN 4.5 01/02/2023   AST 16 01/02/2023   ALT 13 01/02/2023   ALKPHOS 59 01/02/2023   BILITOT 0.5 01/02/2023   GFRNONAA >60 10/30/2023    No results found for: CEA1, CEA, CAN199, CA125  Lab Results  Component Value Date   INR 1.0 03/05/2020   LABPROT 13.2 03/05/2020    Imaging:  US  CORE BIOPSY (LYMPH NODES) Result Date: 11/26/2023 INDICATION: Bladder carcinoma. Hypermetabolic enlarging left inguinal lymph nodes. EXAM: ULTRASOUND GUIDED CORE BIOPSY OF LEFT INGUINAL ADENOPATHY MEDICATIONS: No periprocedural antibiotics were  indicated. ANESTHESIA/SEDATION: Lidocaine  1% subcutaneous PROCEDURE: The procedure, risks, benefits, and alternatives were explained to the patient. Questions regarding the procedure were encouraged and answered. The patient understands and consents to the procedure. Survey ultrasound of the left inguinal region performed, and representative left inguinal adenopathy identified corresponding to CT findings. An appropriate skin entry site was determined and marked. The operative field was prepped with chlorhexidine in a sterile fashion, and a sterile drape was applied covering the operative field. A sterile gown and sterile gloves were used for the procedure. Local anesthesia was provided with 1% Lidocaine . Under real-time ultrasound guidance, a 17 gauge trocar needle was advanced to the margin of the lesion. Once needle tip position was confirmed, coaxial 18-gauge core biopsy samples were obtained, submitted in saline to surgical pathology. The guide needle was removed. Postprocedure scans show no hemorrhage or other apparent complication. COMPLICATIONS: None immediate. FINDINGS: Left inguinal adenopathy localized. Representative core biopsy samples obtained as above. IMPRESSION: Technically successful ultrasound-guided core biopsy, left inguinal adenopathy. Electronically Signed   By: JONETTA Faes M.D.   On: 11/26/2023 09:57    Medications: I have reviewed the patient's current medications.   Assessment/Plan: Metastatic high-grade urothelial carcinoma Presenting with left leg swelling and weakness November 2021 CT abdomen/pelvis 02/17/2020-left hydronephrosis, retroperitoneal adenopathy, scrotal thickening and edema CT-guided biopsy of left retroperitoneal lymphadenopathy 03/05/2020-poorly differentiated carcinoma consistent with a urothelial primary, PD-L1 combined positive score 100%, tumor mutation burden 26, MSS, ERBB2 amplification Left trigone biopsy 03/21/2020-small foci of infiltrative high-grade  urothelial carcinoma with invasion of the muscularis propria and lymphovascular invasion Renal pelvis and left upper tract washing 03/21/2020-high-grade urothelial carcinoma PET 03/21/2020-extensive bilateral retroperitoneal, pelvic, and inguinal hypermetabolic adenopathy, hypermetabolism in the antral region of the stomach with no obvious mass on CT, no evidence of metastatic disease in the neck, chest, or bones Gemcitabine /cisplatin  for 3 cycles beginning 03/27/2020 CT 06/27/2020-marked interval response with some mildly enlarged lymph nodes in the retroperitoneum and pelvis in the range of 1 cm, stranding extending into the base of the penis and suprapubic soft tissue-similar Pembrolizumab  beginning 06/08/2020, changed to every 6-week dosing beginning with cycle 2 CTs 09/18/2021-no evidence of metastatic or recurrent disease in the chest, abdomen, or pelvis, stable 10 mm right pelvic sidewall node mild stranding about the base of the penis PET 05/30/2022-stable compared to 10/18/2021, no evidence of metastatic disease, right pelvic sidewall node described on the 2023 CT is not hypermetabolic Final cycle of pembrolizumab  09/05/2022 PET 12/26/2022-no evidence of recurrent disease PET 08/14/2023-2 enlarging hypermetabolic left inguinal nodes, small retroperitoneal nodes more superiorly are unchanged and are not hypermetabolic Abdomen pelvis 10/30/2023: Increased pathological enlarged left inguinal nodes, new enlarged left external iliac node 11/25/2023: Ultrasound-guided biopsy left inguinal lymph node 11/27/2023 cycle 1 Enfortumab/pembrolizumab   2.  Status post cholecystectomy 3.  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  There are persistent firm palpable left inguinal lymph nodes.  He underwent a left inguinal lymph node biopsy 11/25/2023.  The pathologist contacted me with a preliminary report that the lymph node is positive for metastatic  carcinoma consistent with urothelial carcinoma.  Immunohistochemical stains are pending. 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  We again reviewed potential toxicities associated with this regimen.  He agrees to proceed.  He will return for an office visit and day 8 enfortumab in 1 week.  The left inguinal lymph node biopsy tissue will be submitted for NGS testing.  Arley Hof, MD  11/27/2023  9:33 AM

## 2023-11-27 NOTE — Patient Instructions (Signed)
 [facility] - A DEPT OF [facility]   Discharge Instructions: Thank you for choosing [facility] to provide your oncology and hematology care.   If you have a lab appointment with the Cancer Center, please go directly to the Cancer Center and check in at the registration area.   Wear comfortable clothing and clothing appropriate for easy access to any Portacath or PICC line.   We strive to give you quality time with your provider. You may need to reschedule your appointment if you arrive late (15 or more minutes).  Arriving late affects you and other patients whose appointments are after yours.  Also, if you miss three or more appointments without notifying the office, you may be dismissed from the clinic at the provider's discretion.      For prescription refill requests, have your pharmacy contact our office and allow 72 hours for refills to be completed.    Today you received the following chemotherapy and/or immunotherapy agents PADCEV /Pembrolizumab  (KEYTRUDA ).      To help prevent nausea and vomiting after your treatment, we encourage you to take your nausea medication as directed.  BELOW ARE SYMPTOMS THAT SHOULD BE REPORTED IMMEDIATELY: *FEVER GREATER THAN 100.4 F (38 C) OR HIGHER *CHILLS OR SWEATING *NAUSEA AND VOMITING THAT IS NOT CONTROLLED WITH YOUR NAUSEA MEDICATION *UNUSUAL SHORTNESS OF BREATH *UNUSUAL BRUISING OR BLEEDING *URINARY PROBLEMS (pain or burning when urinating, or frequent urination) *BOWEL PROBLEMS (unusual diarrhea, constipation, pain near the anus) TENDERNESS IN MOUTH AND THROAT WITH OR WITHOUT PRESENCE OF ULCERS (sore throat, sores in mouth, or a toothache) UNUSUAL RASH, SWELLING OR PAIN  UNUSUAL VAGINAL DISCHARGE OR ITCHING   Items with * indicate a potential emergency and should be followed up as soon as possible or go to the Emergency Department if any problems should occur.  Please show the CHEMOTHERAPY  ALERT CARD or IMMUNOTHERAPY ALERT CARD at check-in to the Emergency Department and triage nurse.  Should you have questions after your visit or need to cancel or reschedule your appointment, please contact [facility] - A DEPT OF [facility]  Dept: [phone]  and follow the prompts.  Office hours are 8:00 a.m. to 4:30 p.m. Monday - Friday. Please note that voicemails left after 4:00 p.m. may not be returned until the following business day.  We are closed weekends and major holidays. You have access to a nurse at all times for urgent questions. Please call the main number to the clinic Dept: [phone] and follow the prompts.   For any non-urgent questions, you may also contact your provider using MyChart. We now offer e-Visits for anyone 39 and older to request care online for non-urgent symptoms. For details visit [email or web address].   Also download the MyChart app! Go to the app store, search MyChart, open the app, select [place], and log in with your MyChart username and password.  Pembrolizumab  Injection What is this medication? PEMBROLIZUMAB  (PEM broe LIZ ue mab) treats some types of cancer. It works by helping your immune system slow or stop the spread of cancer cells. It is a monoclonal antibody. This medicine may be used for other purposes; ask your health care provider or pharmacist if you have questions. COMMON BRAND NAME(S): Keytruda  What should I tell my care team before I take this medication? They need to know if you have any of these conditions: Allogeneic stem cell transplant (uses someone else's stem cells) Autoimmune diseases,  such as Crohn disease, ulcerative colitis, lupus History of chest radiation Nervous system problems, such as Guillain-Barre syndrome, myasthenia gravis Organ transplant An unusual or allergic reaction to pembrolizumab , other medications, foods, dyes, or preservatives Pregnant or trying to get  pregnant Breast-feeding How should I use this medication? This medication is injected into a vein. It is given by your care team in a hospital or clinic setting. A special MedGuide will be given to you before each treatment. Be sure to read this information carefully each time. Talk to your care team about the use of this medication in children. While it may be prescribed for children as young as 6 months for selected conditions, precautions do apply. Overdosage: If you think you have taken too much of this medicine contact a poison control center or emergency room at once. NOTE: This medicine is only for you. Do not share this medicine with others. What if I miss a dose? Keep appointments for follow-up doses. It is important not to miss your dose. Call your care team if you are unable to keep an appointment. What may interact with this medication? Interactions have not been studied. This list may not describe all possible interactions. Give your health care provider a list of all the medicines, herbs, non-prescription drugs, or dietary supplements you use. Also tell them if you smoke, drink alcohol, or use illegal drugs. Some items may interact with your medicine. What should I watch for while using this medication? Your condition will be monitored carefully while you are receiving this medication. You may need blood work while taking this medication. This medication may cause serious skin reactions. They can happen weeks to months after starting the medication. Contact your care team right away if you notice fevers or flu-like symptoms with a rash. The rash may be red or purple and then turn into blisters or peeling of the skin. You may also notice a red rash with swelling of the face, lips, or lymph nodes in your neck or under your arms. Tell your care team right away if you have any change in your eyesight. Talk to your care team if you may be pregnant. Serious birth defects can occur if you  take this medication during pregnancy and for 4 months after the last dose. You will need a negative pregnancy test before starting this medication. Contraception is recommended while taking this medication and for 4 months after the last dose. Your care team can help you find the option that works for you. Do not breastfeed while taking this medication and for 4 months after the last dose. What side effects may I notice from receiving this medication? Side effects that you should report to your care team as soon as possible: Allergic reactions--skin rash, itching, hives, swelling of the face, lips, tongue, or throat Dry cough, shortness of breath or trouble breathing Eye pain, redness, irritation, or discharge with blurry or decreased vision Heart muscle inflammation--unusual weakness or fatigue, shortness of breath, chest pain, fast or irregular heartbeat, dizziness, swelling of the ankles, feet, or hands Hormone gland problems--headache, sensitivity to light, unusual weakness or fatigue, dizziness, fast or irregular heartbeat, increased sensitivity to cold or heat, excessive sweating, constipation, hair loss, increased thirst or amount of urine, tremors or shaking, irritability Infusion reactions--chest pain, shortness of breath or trouble breathing, feeling faint or lightheaded Kidney injury (glomerulonephritis)--decrease in the amount of urine, red or dark brown urine, foamy or bubbly urine, swelling of the ankles, hands, or feet Liver injury--right  upper belly pain, loss of appetite, nausea, light-colored stool, dark yellow or brown urine, yellowing skin or eyes, unusual weakness or fatigue Pain, tingling, or numbness in the hands or feet, muscle weakness, change in vision, confusion or trouble speaking, loss of balance or coordination, trouble walking, seizures Rash, fever, and swollen lymph nodes Redness, blistering, peeling, or loosening of the skin, including inside the mouth Sudden or  severe stomach pain, bloody diarrhea, fever, nausea, vomiting Side effects that usually do not require medical attention (report to your care team if they continue or are bothersome): Bone, joint, or muscle pain Diarrhea Fatigue Loss of appetite Nausea Skin rash This list may not describe all possible side effects. Call your doctor for medical advice about side effects. You may report side effects to FDA at 1-800-FDA-1088. Where should I keep my medication? This medication is given in a hospital or clinic. It will not be stored at home. NOTE: This sheet is a summary. It may not cover all possible information. If you have questions about this medicine, talk to your doctor, pharmacist, or health care provider.  2024 Elsevier/Gold Standard (2021-08-06 00:00:00)

## 2023-11-27 NOTE — Progress Notes (Signed)
 Patient seen by Dr. Arley Hof today  Vitals are within treatment parameters:Yes   Labs are within treatment parameters: Yes   Treatment plan has been signed: Yes   Per physician team, Patient is ready for treatment and there are NO modifications to the treatment plan.

## 2023-11-27 NOTE — Progress Notes (Signed)
 Request sent to pathology department for  The accession number is [id number]  the [facility] test codes are Test 93234 and test 12353

## 2023-11-28 LAB — T4: T4, Total: 8.3 ug/dL (ref 4.5–12.0)

## 2023-11-29 ENCOUNTER — Other Ambulatory Visit: Payer: Self-pay

## 2023-12-03 ENCOUNTER — Encounter: Payer: Self-pay | Admitting: *Deleted

## 2023-12-03 NOTE — Progress Notes (Signed)
 NGS testing requested through [employer] for accession number [id number] and testing numbers for [facility] are  [id number] and [id number]

## 2023-12-04 ENCOUNTER — Inpatient Hospital Stay (HOSPITAL_BASED_OUTPATIENT_CLINIC_OR_DEPARTMENT_OTHER): Admitting: Oncology

## 2023-12-04 ENCOUNTER — Other Ambulatory Visit

## 2023-12-04 ENCOUNTER — Inpatient Hospital Stay

## 2023-12-04 VITALS — BP 143/82 | HR 60 | Temp 97.7°F | Resp 18 | Ht 72.0 in | Wt 243.8 lb

## 2023-12-04 DIAGNOSIS — C679 Malignant neoplasm of bladder, unspecified: Secondary | ICD-10-CM

## 2023-12-04 DIAGNOSIS — D696 Thrombocytopenia, unspecified: Secondary | ICD-10-CM | POA: Diagnosis not present

## 2023-12-04 LAB — CBC WITH DIFFERENTIAL (CANCER CENTER ONLY)
Abs Immature Granulocytes: 0.02 K/uL (ref 0.00–0.07)
Basophils Absolute: 0.1 K/uL (ref 0.0–0.1)
Basophils Relative: 1 %
Eosinophils Absolute: 0.3 K/uL (ref 0.0–0.5)
Eosinophils Relative: 7 %
HCT: 36.9 % — ABNORMAL LOW (ref 39.0–52.0)
Hemoglobin: 12.9 g/dL — ABNORMAL LOW (ref 13.0–17.0)
Immature Granulocytes: 0 %
Lymphocytes Relative: 27 %
Lymphs Abs: 1.4 K/uL (ref 0.7–4.0)
MCH: 28.6 pg (ref 26.0–34.0)
MCHC: 35 g/dL (ref 30.0–36.0)
MCV: 81.8 fL (ref 80.0–100.0)
Monocytes Absolute: 0.6 K/uL (ref 0.1–1.0)
Monocytes Relative: 12 %
Neutro Abs: 2.7 K/uL (ref 1.7–7.7)
Neutrophils Relative %: 53 %
Platelet Count: 165 K/uL (ref 150–400)
RBC: 4.51 MIL/uL (ref 4.22–5.81)
RDW: 11.9 % (ref 11.5–15.5)
WBC Count: 5.1 K/uL (ref 4.0–10.5)
nRBC: 0 % (ref 0.0–0.2)

## 2023-12-04 LAB — CMP (CANCER CENTER ONLY)
ALT: 17 U/L (ref 0–44)
AST: 32 U/L (ref 15–41)
Albumin: 4 g/dL (ref 3.5–5.0)
Alkaline Phosphatase: 66 U/L (ref 38–126)
Anion gap: 12 (ref 5–15)
BUN: 23 mg/dL (ref 8–23)
CO2: 23 mmol/L (ref 22–32)
Calcium: 9.4 mg/dL (ref 8.9–10.3)
Chloride: 103 mmol/L (ref 98–111)
Creatinine: 1.06 mg/dL (ref 0.61–1.24)
GFR, Estimated: >60 mL/min (ref >60)
Glucose, Bld: 138 mg/dL — ABNORMAL HIGH (ref 70–99)
Potassium: 3.8 mmol/L (ref 3.5–5.1)
Sodium: 138 mmol/L (ref 135–145)
Total Bilirubin: 0.3 mg/dL (ref 0.0–1.2)
Total Protein: 7.1 g/dL (ref 6.5–8.1)

## 2023-12-04 MED ORDER — PROCHLORPERAZINE MALEATE 10 MG PO TABS
10.0000 mg | ORAL_TABLET | Freq: Once | ORAL | Status: AC
  Administered 2023-12-04: 10 mg via ORAL
  Filled 2023-12-04: qty 1

## 2023-12-04 MED ORDER — SODIUM CHLORIDE 0.9 % IV SOLN
120.0000 mg | Freq: Once | INTRAVENOUS | Status: AC
  Administered 2023-12-04: 120 mg via INTRAVENOUS
  Filled 2023-12-04: qty 12

## 2023-12-04 MED ORDER — SODIUM CHLORIDE 0.9 % IV SOLN: INTRAVENOUS | Status: DC

## 2023-12-04 NOTE — Progress Notes (Signed)
 Mr. Shiffler declines repeat BP reading. Patient seen by Dr. Arley Hof today  Vitals are within treatment parameters:Yes   Labs are within treatment parameters: Yes   Treatment plan has been signed: Yes   Per physician team, Patient is ready for treatment and there are NO modifications to the treatment plan. Keep the enfortumab at 125 mg unless pharmacy needs to change to 120 mg as was done on 1st treatment.

## 2023-12-04 NOTE — Patient Instructions (Signed)
 [facility] - A DEPT OF [facility]   Discharge Instructions: Thank you for choosing [facility] to provide your oncology and hematology care.   If you have a lab appointment with the Cancer Center, please go directly to the Cancer Center and check in at the registration area.   Wear comfortable clothing and clothing appropriate for easy access to any Portacath or PICC line.   We strive to give you quality time with your provider. You may need to reschedule your appointment if you arrive late (15 or more minutes).  Arriving late affects you and other patients whose appointments are after yours.  Also, if you miss three or more appointments without notifying the office, you may be dismissed from the clinic at the provider's discretion.      For prescription refill requests, have your pharmacy contact our office and allow 72 hours for refills to be completed.    Today you received the following chemotherapy and/or immunotherapy agents PADCEV .      To help prevent nausea and vomiting after your treatment, we encourage you to take your nausea medication as directed.  BELOW ARE SYMPTOMS THAT SHOULD BE REPORTED IMMEDIATELY: *FEVER GREATER THAN 100.4 F (38 C) OR HIGHER *CHILLS OR SWEATING *NAUSEA AND VOMITING THAT IS NOT CONTROLLED WITH YOUR NAUSEA MEDICATION *UNUSUAL SHORTNESS OF BREATH *UNUSUAL BRUISING OR BLEEDING *URINARY PROBLEMS (pain or burning when urinating, or frequent urination) *BOWEL PROBLEMS (unusual diarrhea, constipation, pain near the anus) TENDERNESS IN MOUTH AND THROAT WITH OR WITHOUT PRESENCE OF ULCERS (sore throat, sores in mouth, or a toothache) UNUSUAL RASH, SWELLING OR PAIN  UNUSUAL VAGINAL DISCHARGE OR ITCHING   Items with * indicate a potential emergency and should be followed up as soon as possible or go to the Emergency Department if any problems should occur.  Please show the CHEMOTHERAPY ALERT CARD or IMMUNOTHERAPY  ALERT CARD at check-in to the Emergency Department and triage nurse.  Should you have questions after your visit or need to cancel or reschedule your appointment, please contact [facility] - A DEPT OF [facility]  Dept: [phone]  and follow the prompts.  Office hours are 8:00 a.m. to 4:30 p.m. Monday - Friday. Please note that voicemails left after 4:00 p.m. may not be returned until the following business day.  We are closed weekends and major holidays. You have access to a nurse at all times for urgent questions. Please call the main number to the clinic Dept: [phone] and follow the prompts.   For any non-urgent questions, you may also contact your provider using MyChart. We now offer e-Visits for anyone 79 and older to request care online for non-urgent symptoms. For details visit [email or web address].   Also download the MyChart app! Go to the app store, search MyChart, open the app, select [place], and log in with your MyChart username and password.

## 2023-12-04 NOTE — Progress Notes (Signed)
 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  He felt chilled on the evening of treatment.  This resolved the following day.  No rash, nausea, or diarrhea.  He reports altered taste.  He is less bothered by the groin lymph nodes and feels they are smaller.  Objective:  Vital signs in last 24 hours:  Blood pressure (!) 143/82, pulse 60, temperature 97.7 F (36.5 C), temperature source Temporal, resp. rate 18, height 6' (1.829 m), weight 243 lb 12.8 oz (110.6 kg), SpO2 100%.    HEENT: No thrush or ulcers Lymphatics: Firm left inguinal/upper femoral nodes appear smaller Resp: Lungs clear bilaterally Cardio: Regular rate and rhythm GI: No hepatosplenomegaly, nontender Vascular: No leg edema  Portacath/PICC-without erythema  Lab Results:  Lab Results  Component Value Date   WBC 5.1 12/04/2023   HGB 12.9 (L) 12/04/2023   HCT 36.9 (L) 12/04/2023   MCV 81.8 12/04/2023   PLT 165 12/04/2023   NEUTROABS 2.7 12/04/2023    CMP  Lab Results  Component Value Date   NA 138 12/04/2023   K 3.8 12/04/2023   CL 103 12/04/2023   CO2 23 12/04/2023   GLUCOSE 138 (H) 12/04/2023   BUN 23 12/04/2023   CREATININE 1.06 12/04/2023   CALCIUM 9.4 12/04/2023   PROT 7.1 12/04/2023   ALBUMIN 4.0 12/04/2023   AST 32 12/04/2023   ALT 17 12/04/2023   ALKPHOS 66 12/04/2023   BILITOT 0.3 12/04/2023   GFRNONAA >60 12/04/2023     Medications: I have reviewed the patient's current medications.   Assessment/Plan: Metastatic high-grade urothelial carcinoma Presenting with left leg swelling and weakness November 2021 CT abdomen/pelvis 02/17/2020-left hydronephrosis, retroperitoneal adenopathy, scrotal thickening and edema CT-guided biopsy of left retroperitoneal lymphadenopathy 03/05/2020-poorly differentiated carcinoma consistent with a urothelial primary, PD-L1  combined positive score 100%, tumor mutation burden 26, MSS, ERBB2 amplification Left trigone biopsy 03/21/2020-small foci of infiltrative high-grade urothelial carcinoma with invasion of the muscularis propria and lymphovascular invasion Renal pelvis and left upper tract washing 03/21/2020-high-grade urothelial carcinoma PET 03/21/2020-extensive bilateral retroperitoneal, pelvic, and inguinal hypermetabolic adenopathy, hypermetabolism in the antral region of the stomach with no obvious mass on CT, no evidence of metastatic disease in the neck, chest, or bones Gemcitabine /cisplatin  for 3 cycles beginning 03/27/2020 CT 06/27/2020-marked interval response with some mildly enlarged lymph nodes in the retroperitoneum and pelvis in the range of 1 cm, stranding extending into the base of the penis and suprapubic soft tissue-similar Pembrolizumab  beginning 06/08/2020, changed to every 6-week dosing beginning with cycle 2 CTs 09/18/2021-no evidence of metastatic or recurrent disease in the chest, abdomen, or pelvis, stable 10 mm right pelvic sidewall node mild stranding about the base of the penis PET 05/30/2022-stable compared to 10/18/2021, no evidence of metastatic disease, right pelvic sidewall node described on the 2023 CT is not hypermetabolic Final cycle of pembrolizumab  09/05/2022 PET 12/26/2022-no evidence of recurrent disease PET 08/14/2023-2 enlarging hypermetabolic left inguinal nodes, small retroperitoneal nodes more superiorly are unchanged and are not hypermetabolic Abdomen pelvis 10/30/2023: Increased pathological enlarged left inguinal nodes, new enlarged left external iliac node 11/25/2023: Ultrasound-guided biopsy left inguinal lymph node-metastatic poorly differentiated urothelial carcinoma, p63 and GATA3 positive 11/27/2023 cycle 1 Enfortumab/pembrolizumab , day 8 enfortumab 2925  2.  Status post cholecystectomy 3.  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  He  will complete day 8 enfortumab today.  He will return for an office visit prior to cycle 2 in 2 weeks.  He will call for a fever.  The palpable lymph nodes in the left groin appear smaller.  We submitted the inguinal biopsy tissue for NGS testing.  Arley Hof, MD  12/04/2023  9:40 AM

## 2023-12-05 ENCOUNTER — Other Ambulatory Visit: Payer: Self-pay

## 2023-12-08 ENCOUNTER — Encounter: Payer: Self-pay | Admitting: Oncology

## 2023-12-08 ENCOUNTER — Other Ambulatory Visit: Payer: Self-pay | Admitting: *Deleted

## 2023-12-08 MED ORDER — LIDOCAINE-PRILOCAINE 2.5-2.5 % EX CREA: 1.0000 | TOPICAL_CREAM | CUTANEOUS | 2 refills | Status: DC

## 2023-12-08 NOTE — Progress Notes (Signed)
 [name] sent message requesting EMLA  cream be called in.

## 2023-12-18 ENCOUNTER — Inpatient Hospital Stay (HOSPITAL_BASED_OUTPATIENT_CLINIC_OR_DEPARTMENT_OTHER): Admitting: Nurse Practitioner

## 2023-12-18 ENCOUNTER — Inpatient Hospital Stay

## 2023-12-18 ENCOUNTER — Inpatient Hospital Stay: Attending: Oncology

## 2023-12-18 ENCOUNTER — Encounter: Payer: Self-pay | Admitting: Nurse Practitioner

## 2023-12-18 VITALS — BP 135/75 | HR 66 | Temp 97.6°F | Resp 18

## 2023-12-18 VITALS — BP 145/82 | HR 68 | Temp 97.9°F | Resp 16 | Ht 72.0 in | Wt 249.6 lb

## 2023-12-18 DIAGNOSIS — C679 Malignant neoplasm of bladder, unspecified: Secondary | ICD-10-CM

## 2023-12-18 DIAGNOSIS — D696 Thrombocytopenia, unspecified: Secondary | ICD-10-CM | POA: Insufficient documentation

## 2023-12-18 DIAGNOSIS — Z8551 Personal history of malignant neoplasm of bladder: Secondary | ICD-10-CM | POA: Insufficient documentation

## 2023-12-18 LAB — CMP (CANCER CENTER ONLY)
ALT: 63 U/L — ABNORMAL HIGH (ref 0–44)
AST: 47 U/L — ABNORMAL HIGH (ref 15–41)
Albumin: 4 g/dL (ref 3.5–5.0)
Alkaline Phosphatase: 69 U/L (ref 38–126)
Anion gap: 14 (ref 5–15)
BUN: 15 mg/dL (ref 8–23)
CO2: 22 mmol/L (ref 22–32)
Calcium: 9.1 mg/dL (ref 8.9–10.3)
Chloride: 104 mmol/L (ref 98–111)
Creatinine: 1.07 mg/dL (ref 0.61–1.24)
GFR, Estimated: >60 mL/min (ref >60)
Glucose, Bld: 138 mg/dL — ABNORMAL HIGH (ref 70–99)
Potassium: 3.9 mmol/L (ref 3.5–5.1)
Sodium: 140 mmol/L (ref 135–145)
Total Bilirubin: 0.4 mg/dL (ref 0.0–1.2)
Total Protein: 7 g/dL (ref 6.5–8.1)

## 2023-12-18 LAB — CBC WITH DIFFERENTIAL (CANCER CENTER ONLY)
Abs Immature Granulocytes: 0.01 K/uL (ref 0.00–0.07)
Basophils Absolute: 0.1 K/uL (ref 0.0–0.1)
Basophils Relative: 2 %
Eosinophils Absolute: 0.6 K/uL — ABNORMAL HIGH (ref 0.0–0.5)
Eosinophils Relative: 12 %
HCT: 37 % — ABNORMAL LOW (ref 39.0–52.0)
Hemoglobin: 12.7 g/dL — ABNORMAL LOW (ref 13.0–17.0)
Immature Granulocytes: 0 %
Lymphocytes Relative: 44 %
Lymphs Abs: 2 K/uL (ref 0.7–4.0)
MCH: 28.7 pg (ref 26.0–34.0)
MCHC: 34.3 g/dL (ref 30.0–36.0)
MCV: 83.5 fL (ref 80.0–100.0)
Monocytes Absolute: 0.6 K/uL (ref 0.1–1.0)
Monocytes Relative: 14 %
Neutro Abs: 1.2 K/uL — ABNORMAL LOW (ref 1.7–7.7)
Neutrophils Relative %: 28 %
Platelet Count: 143 K/uL — ABNORMAL LOW (ref 150–400)
RBC: 4.43 MIL/uL (ref 4.22–5.81)
RDW: 13.1 % (ref 11.5–15.5)
WBC Count: 4.5 K/uL (ref 4.0–10.5)
nRBC: 0 % (ref 0.0–0.2)

## 2023-12-18 MED ORDER — SODIUM CHLORIDE 0.9 % IV SOLN
200.0000 mg | Freq: Once | INTRAVENOUS | Status: AC
  Administered 2023-12-18: 200 mg via INTRAVENOUS
  Filled 2023-12-18: qty 8

## 2023-12-18 MED ORDER — SODIUM CHLORIDE 0.9 % IV SOLN: INTRAVENOUS | Status: DC

## 2023-12-18 MED ORDER — PROCHLORPERAZINE MALEATE 10 MG PO TABS
10.0000 mg | ORAL_TABLET | Freq: Once | ORAL | Status: AC
  Administered 2023-12-18: 10 mg via ORAL
  Filled 2023-12-18: qty 1

## 2023-12-18 MED ORDER — SODIUM CHLORIDE 0.9 % IV SOLN
0.9000 mg/kg | Freq: Once | INTRAVENOUS | Status: AC
  Administered 2023-12-18: 100 mg via INTRAVENOUS
  Filled 2023-12-18: qty 9

## 2023-12-18 NOTE — Progress Notes (Signed)
 Edward [name] returns as scheduled.  He completed cycle 1 day 8 enfortumab 12/04/2023.  He denies nausea/vomiting.  No diarrhea.  No cough or shortness of breath.  No eye symptoms.  No numbness or tingling in the hands or feet.  He reports developing a mildly pruritic rash in various areas 1 to 2 days after the day 8 treatment.  No bullae formation.  He applied triamcinolone cream and noted improvement.  Today he notes the areas of previous rash are no longer erythematous, dry appearing.  He had a single ulcer along the left lateral tongue, now resolved.  Oral mucosa felt rough in areas.  Objective:  Vital signs in last 24 hours:  Blood pressure (!) 145/82, pulse 68, temperature 97.9 F (36.6 C), temperature source Oral, resp. rate 16, height 6' (1.829 m), weight 249 lb 9.6 oz (113.2 kg), SpO2 99%.    HEENT: No thrush or ulcers. Lymphatics: 1 to 2 cm left femoral node. Resp: Lungs clear bilaterally. Cardio: Regular rate and rhythm. GI: No hepatosplenomegaly.  Nontender. Vascular: Trace lower leg edema bilaterally. Skin: Bilateral upper inner arm/axilla with mild hyperpigmentation of the skin, dryness.  Lower abdominal skin fold is mildly erythematous, hyperpigmented.  No blister appearing lesions. Port-A-Cath without erythema.  Lab Results:  Lab Results  Component Value Date   WBC 5.1 12/04/2023   HGB 12.9 (L) 12/04/2023   HCT 36.9 (L) 12/04/2023   MCV 81.8 12/04/2023   PLT 165 12/04/2023   NEUTROABS 2.7 12/04/2023    Imaging:  No results found.  Medications: I have reviewed the patient's current medications.  Assessment/Plan: Metastatic high-grade urothelial carcinoma Presenting with left leg swelling and weakness November 2021 CT abdomen/pelvis 02/17/2020-left hydronephrosis, retroperitoneal adenopathy, scrotal thickening and edema CT-guided biopsy of left  retroperitoneal lymphadenopathy 03/05/2020-poorly differentiated carcinoma consistent with a urothelial primary, PD-L1 combined positive score 100%, tumor mutation burden 26, MSS, ERBB2 amplification Left trigone biopsy 03/21/2020-small foci of infiltrative high-grade urothelial carcinoma with invasion of the muscularis propria and lymphovascular invasion Renal pelvis and left upper tract washing 03/21/2020-high-grade urothelial carcinoma PET 03/21/2020-extensive bilateral retroperitoneal, pelvic, and inguinal hypermetabolic adenopathy, hypermetabolism in the antral region of the stomach with no obvious mass on CT, no evidence of metastatic disease in the neck, chest, or bones Gemcitabine /cisplatin  for 3 cycles beginning 03/27/2020 CT 06/27/2020-marked interval response with some mildly enlarged lymph nodes in the retroperitoneum and pelvis in the range of 1 cm, stranding extending into the base of the penis and suprapubic soft tissue-similar Pembrolizumab  beginning 06/08/2020, changed to every 6-week dosing beginning with cycle 2 CTs 09/18/2021-no evidence of metastatic or recurrent disease in the chest, abdomen, or pelvis, stable 10 mm right pelvic sidewall node mild stranding about the base of the penis PET 05/30/2022-stable compared to 10/18/2021, no evidence of metastatic disease, right pelvic sidewall node described on the 2023 CT is not hypermetabolic Final cycle of pembrolizumab  09/05/2022 PET 12/26/2022-no evidence of recurrent disease PET 08/14/2023-2 enlarging hypermetabolic left inguinal nodes, small retroperitoneal nodes more superiorly are unchanged and are not hypermetabolic Abdomen pelvis 10/30/2023: Increased pathological enlarged left inguinal nodes, new enlarged left external iliac node 11/25/2023: Ultrasound-guided biopsy left inguinal lymph node-metastatic poorly differentiated urothelial carcinoma, p63 and GATA3 positive 11/27/2023 cycle 1 Enfortumab/pembrolizumab , day 8 enfortumab  12/04/2023 12/18/2023 cycle 2 Enfortumab/Pembrolizumab , enfortumab dose reduced due to neutropenia   2.  Status post cholecystectomy 3.  Chronic mild thrombocytopenia following chemotherapy  Disposition: Edward [name] appears stable.  He has completed 1 cycle of Enfortumab/pembrolizumab .  Inguinal/femoral adenopathy is better.  He had a mild skin rash after the day 8 enfortumab.  Plan to proceed with cycle 2 today as scheduled.  Enfortumab will be dose reduced due to mild neutropenia.  We reviewed precautions.  He understands to contact the office with fever, chills, other signs of infection.  He also understands to contact the office if the rash recurs.  He will return for follow-up and the day 8 treatment in 1 week.  We are available to see him sooner if needed.  Patient seen with Dr. Cloretta.    Olam Ned ANP/GNP-BC   12/18/2023  9:00 AM This was a shared visit with Olam Ned.  Edward [name] was interviewed and examined.  He will begin cycle 2 enfortumab today.  The chemotherapy will be dose reduced secondary to mild neutropenia.  He will call for a fever or symptoms of an infection.  He will return for an office visit and day 8 enfortumab on 12/25/2023.  He has a mild rash, likely secondary to enfortumab.  The palpable lymphadenopathy is significantly improved.  I was present for greater than 50% of today's visit.  I performed medical decision making.  Arvella Cloretta, MD

## 2023-12-18 NOTE — Progress Notes (Signed)
 Patient seen by Dr. Arley Hof today  Vitals are within treatment parameters:Yes OK to treat w/BP 145/82  Labs are within treatment parameters: No (Please specify and give further instructions.)  May proceed w/ANC 1.2  Treatment plan has been signed: Yes   Per physician team, Patient is ready for treatment. Please note the following modifications: MD has dose reduced Padcev 

## 2023-12-18 NOTE — Patient Instructions (Signed)
 [facility] - A DEPT OF [facility]   Discharge Instructions: Thank you for choosing [facility] to provide your oncology and hematology care.   If you have a lab appointment with the Cancer Center, please go directly to the Cancer Center and check in at the registration area.   Wear comfortable clothing and clothing appropriate for easy access to any Portacath or PICC line.   We strive to give you quality time with your provider. You may need to reschedule your appointment if you arrive late (15 or more minutes).  Arriving late affects you and other patients whose appointments are after yours.  Also, if you miss three or more appointments without notifying the office, you may be dismissed from the clinic at the provider's discretion.      For prescription refill requests, have your pharmacy contact our office and allow 72 hours for refills to be completed.    Today you received the following chemotherapy and/or immunotherapy agents PADCEV /Pembrolizumab  (KEYTRUDA ).      To help prevent nausea and vomiting after your treatment, we encourage you to take your nausea medication as directed.  BELOW ARE SYMPTOMS THAT SHOULD BE REPORTED IMMEDIATELY: *FEVER GREATER THAN 100.4 F (38 C) OR HIGHER *CHILLS OR SWEATING *NAUSEA AND VOMITING THAT IS NOT CONTROLLED WITH YOUR NAUSEA MEDICATION *UNUSUAL SHORTNESS OF BREATH *UNUSUAL BRUISING OR BLEEDING *URINARY PROBLEMS (pain or burning when urinating, or frequent urination) *BOWEL PROBLEMS (unusual diarrhea, constipation, pain near the anus) TENDERNESS IN MOUTH AND THROAT WITH OR WITHOUT PRESENCE OF ULCERS (sore throat, sores in mouth, or a toothache) UNUSUAL RASH, SWELLING OR PAIN  UNUSUAL VAGINAL DISCHARGE OR ITCHING   Items with * indicate a potential emergency and should be followed up as soon as possible or go to the Emergency Department if any problems should occur.  Please show the CHEMOTHERAPY  ALERT CARD or IMMUNOTHERAPY ALERT CARD at check-in to the Emergency Department and triage nurse.  Should you have questions after your visit or need to cancel or reschedule your appointment, please contact [facility] - A DEPT OF [facility]  Dept: [phone]  and follow the prompts.  Office hours are 8:00 a.m. to 4:30 p.m. Monday - Friday. Please note that voicemails left after 4:00 p.m. may not be returned until the following business day.  We are closed weekends and major holidays. You have access to a nurse at all times for urgent questions. Please call the main number to the clinic Dept: [phone] and follow the prompts.   For any non-urgent questions, you may also contact your provider using MyChart. We now offer e-Visits for anyone 39 and older to request care online for non-urgent symptoms. For details visit [email or web address].   Also download the MyChart app! Go to the app store, search MyChart, open the app, select [place], and log in with your MyChart username and password.  Pembrolizumab  Injection What is this medication? PEMBROLIZUMAB  (PEM broe LIZ ue mab) treats some types of cancer. It works by helping your immune system slow or stop the spread of cancer cells. It is a monoclonal antibody. This medicine may be used for other purposes; ask your health care provider or pharmacist if you have questions. COMMON BRAND NAME(S): Keytruda  What should I tell my care team before I take this medication? They need to know if you have any of these conditions: Allogeneic stem cell transplant (uses someone else's stem cells) Autoimmune diseases,  such as Crohn disease, ulcerative colitis, lupus History of chest radiation Nervous system problems, such as Guillain-Barre syndrome, myasthenia gravis Organ transplant An unusual or allergic reaction to pembrolizumab , other medications, foods, dyes, or preservatives Pregnant or trying to get  pregnant Breast-feeding How should I use this medication? This medication is injected into a vein. It is given by your care team in a hospital or clinic setting. A special MedGuide will be given to you before each treatment. Be sure to read this information carefully each time. Talk to your care team about the use of this medication in children. While it may be prescribed for children as young as 6 months for selected conditions, precautions do apply. Overdosage: If you think you have taken too much of this medicine contact a poison control center or emergency room at once. NOTE: This medicine is only for you. Do not share this medicine with others. What if I miss a dose? Keep appointments for follow-up doses. It is important not to miss your dose. Call your care team if you are unable to keep an appointment. What may interact with this medication? Interactions have not been studied. This list may not describe all possible interactions. Give your health care provider a list of all the medicines, herbs, non-prescription drugs, or dietary supplements you use. Also tell them if you smoke, drink alcohol, or use illegal drugs. Some items may interact with your medicine. What should I watch for while using this medication? Your condition will be monitored carefully while you are receiving this medication. You may need blood work while taking this medication. This medication may cause serious skin reactions. They can happen weeks to months after starting the medication. Contact your care team right away if you notice fevers or flu-like symptoms with a rash. The rash may be red or purple and then turn into blisters or peeling of the skin. You may also notice a red rash with swelling of the face, lips, or lymph nodes in your neck or under your arms. Tell your care team right away if you have any change in your eyesight. Talk to your care team if you may be pregnant. Serious birth defects can occur if you  take this medication during pregnancy and for 4 months after the last dose. You will need a negative pregnancy test before starting this medication. Contraception is recommended while taking this medication and for 4 months after the last dose. Your care team can help you find the option that works for you. Do not breastfeed while taking this medication and for 4 months after the last dose. What side effects may I notice from receiving this medication? Side effects that you should report to your care team as soon as possible: Allergic reactions--skin rash, itching, hives, swelling of the face, lips, tongue, or throat Dry cough, shortness of breath or trouble breathing Eye pain, redness, irritation, or discharge with blurry or decreased vision Heart muscle inflammation--unusual weakness or fatigue, shortness of breath, chest pain, fast or irregular heartbeat, dizziness, swelling of the ankles, feet, or hands Hormone gland problems--headache, sensitivity to light, unusual weakness or fatigue, dizziness, fast or irregular heartbeat, increased sensitivity to cold or heat, excessive sweating, constipation, hair loss, increased thirst or amount of urine, tremors or shaking, irritability Infusion reactions--chest pain, shortness of breath or trouble breathing, feeling faint or lightheaded Kidney injury (glomerulonephritis)--decrease in the amount of urine, red or dark brown urine, foamy or bubbly urine, swelling of the ankles, hands, or feet Liver injury--right  upper belly pain, loss of appetite, nausea, light-colored stool, dark yellow or brown urine, yellowing skin or eyes, unusual weakness or fatigue Pain, tingling, or numbness in the hands or feet, muscle weakness, change in vision, confusion or trouble speaking, loss of balance or coordination, trouble walking, seizures Rash, fever, and swollen lymph nodes Redness, blistering, peeling, or loosening of the skin, including inside the mouth Sudden or  severe stomach pain, bloody diarrhea, fever, nausea, vomiting Side effects that usually do not require medical attention (report to your care team if they continue or are bothersome): Bone, joint, or muscle pain Diarrhea Fatigue Loss of appetite Nausea Skin rash This list may not describe all possible side effects. Call your doctor for medical advice about side effects. You may report side effects to FDA at 1-800-FDA-1088. Where should I keep my medication? This medication is given in a hospital or clinic. It will not be stored at home. NOTE: This sheet is a summary. It may not cover all possible information. If you have questions about this medicine, talk to your doctor, pharmacist, or health care provider.  2024 Elsevier/Gold Standard (2021-08-06 00:00:00)

## 2023-12-25 ENCOUNTER — Encounter: Payer: Self-pay | Admitting: Oncology

## 2023-12-25 ENCOUNTER — Other Ambulatory Visit (HOSPITAL_BASED_OUTPATIENT_CLINIC_OR_DEPARTMENT_OTHER): Payer: Self-pay

## 2023-12-25 ENCOUNTER — Other Ambulatory Visit

## 2023-12-25 ENCOUNTER — Encounter: Payer: Self-pay | Admitting: *Deleted

## 2023-12-25 ENCOUNTER — Ambulatory Visit: Admitting: Oncology

## 2023-12-25 ENCOUNTER — Inpatient Hospital Stay

## 2023-12-25 ENCOUNTER — Other Ambulatory Visit: Payer: Self-pay | Admitting: *Deleted

## 2023-12-25 ENCOUNTER — Ambulatory Visit

## 2023-12-25 ENCOUNTER — Inpatient Hospital Stay (HOSPITAL_BASED_OUTPATIENT_CLINIC_OR_DEPARTMENT_OTHER): Admitting: Oncology

## 2023-12-25 VITALS — BP 150/84 | HR 67 | Temp 97.9°F | Resp 18 | Ht 72.0 in | Wt 243.7 lb

## 2023-12-25 VITALS — BP 135/76 | HR 65 | Temp 98.2°F | Resp 18

## 2023-12-25 DIAGNOSIS — D696 Thrombocytopenia, unspecified: Secondary | ICD-10-CM | POA: Diagnosis not present

## 2023-12-25 DIAGNOSIS — C679 Malignant neoplasm of bladder, unspecified: Secondary | ICD-10-CM

## 2023-12-25 LAB — CBC WITH DIFFERENTIAL (CANCER CENTER ONLY)
Abs Immature Granulocytes: 0.01 K/uL (ref 0.00–0.07)
Basophils Absolute: 0.1 K/uL (ref 0.0–0.1)
Basophils Relative: 3 %
Eosinophils Absolute: 0.2 K/uL (ref 0.0–0.5)
Eosinophils Relative: 4 %
HCT: 38.4 % — ABNORMAL LOW (ref 39.0–52.0)
Hemoglobin: 13.5 g/dL (ref 13.0–17.0)
Immature Granulocytes: 0 %
Lymphocytes Relative: 50 %
Lymphs Abs: 1.9 K/uL (ref 0.7–4.0)
MCH: 29 pg (ref 26.0–34.0)
MCHC: 35.2 g/dL (ref 30.0–36.0)
MCV: 82.6 fL (ref 80.0–100.0)
Monocytes Absolute: 0.8 K/uL (ref 0.1–1.0)
Monocytes Relative: 19 %
Neutro Abs: 1 K/uL — ABNORMAL LOW (ref 1.7–7.7)
Neutrophils Relative %: 24 %
Platelet Count: 148 K/uL — ABNORMAL LOW (ref 150–400)
RBC: 4.65 MIL/uL (ref 4.22–5.81)
RDW: 13.5 % (ref 11.5–15.5)
WBC Count: 3.9 K/uL — ABNORMAL LOW (ref 4.0–10.5)
nRBC: 0 % (ref 0.0–0.2)

## 2023-12-25 LAB — CMP (CANCER CENTER ONLY)
ALT: 46 U/L — ABNORMAL HIGH (ref 0–44)
AST: 43 U/L — ABNORMAL HIGH (ref 15–41)
Albumin: 4.1 g/dL (ref 3.5–5.0)
Alkaline Phosphatase: 71 U/L (ref 38–126)
Anion gap: 14 (ref 5–15)
BUN: 20 mg/dL (ref 8–23)
CO2: 21 mmol/L — ABNORMAL LOW (ref 22–32)
Calcium: 9.2 mg/dL (ref 8.9–10.3)
Chloride: 104 mmol/L (ref 98–111)
Creatinine: 1.05 mg/dL (ref 0.61–1.24)
GFR, Estimated: >60 mL/min (ref >60)
Glucose, Bld: 144 mg/dL — ABNORMAL HIGH (ref 70–99)
Potassium: 3.6 mmol/L (ref 3.5–5.1)
Sodium: 139 mmol/L (ref 135–145)
Total Bilirubin: 0.5 mg/dL (ref 0.0–1.2)
Total Protein: 7.1 g/dL (ref 6.5–8.1)

## 2023-12-25 MED ORDER — SODIUM CHLORIDE 0.9 % IV SOLN
0.9000 mg/kg | Freq: Once | INTRAVENOUS | Status: AC
  Administered 2023-12-25: 100 mg via INTRAVENOUS
  Filled 2023-12-25: qty 10

## 2023-12-25 MED ORDER — PROCHLORPERAZINE MALEATE 10 MG PO TABS
10.0000 mg | ORAL_TABLET | Freq: Once | ORAL | Status: AC
  Administered 2023-12-25: 10 mg via ORAL
  Filled 2023-12-25: qty 1

## 2023-12-25 MED ORDER — MAGIC MOUTHWASH: 5.0000 mL | Freq: Four times a day (QID) | ORAL | 1 refills | Status: DC | PRN

## 2023-12-25 MED ORDER — NYSTATIN 100000 UNIT/ML MT SUSP
5.0000 mL | Freq: Four times a day (QID) | OROMUCOSAL | 1 refills | Status: AC | PRN
  Filled 2023-12-25: qty 240, 12d supply, fill #0

## 2023-12-25 MED ORDER — SODIUM CHLORIDE 0.9 % IV SOLN: INTRAVENOUS | Status: DC

## 2023-12-25 NOTE — Patient Instructions (Signed)

## 2023-12-25 NOTE — Patient Instructions (Signed)
 [facility] - A DEPT OF [facility]  Discharge Instructions: Thank you for choosing [facility] to provide your oncology and hematology care.   If you have a lab appointment with the Cancer Center, please go directly to the Cancer Center and check in at the registration area.   Wear comfortable clothing and clothing appropriate for easy access to any Portacath or PICC line.   We strive to give you quality time with your provider. You may need to reschedule your appointment if you arrive late (15 or more minutes).  Arriving late affects you and other patients whose appointments are after yours.  Also, if you miss three or more appointments without notifying the office, you may be dismissed from the clinic at the provider's discretion.      For prescription refill requests, have your pharmacy contact our office and allow 72 hours for refills to be completed.    Today you received the following chemotherapy and/or immunotherapy agents: padcev    To help prevent nausea and vomiting after your treatment, we encourage you to take your nausea medication as directed.  BELOW ARE SYMPTOMS THAT SHOULD BE REPORTED IMMEDIATELY: *FEVER GREATER THAN 100.4 F (38 C) OR HIGHER *CHILLS OR SWEATING *NAUSEA AND VOMITING THAT IS NOT CONTROLLED WITH YOUR NAUSEA MEDICATION *UNUSUAL SHORTNESS OF BREATH *UNUSUAL BRUISING OR BLEEDING *URINARY PROBLEMS (pain or burning when urinating, or frequent urination) *BOWEL PROBLEMS (unusual diarrhea, constipation, pain near the anus) TENDERNESS IN MOUTH AND THROAT WITH OR WITHOUT PRESENCE OF ULCERS (sore throat, sores in mouth, or a toothache) UNUSUAL RASH, SWELLING OR PAIN  UNUSUAL VAGINAL DISCHARGE OR ITCHING   Items with * indicate a potential emergency and should be followed up as soon as possible or go to the Emergency Department if any problems should occur.  Please show the CHEMOTHERAPY ALERT CARD or IMMUNOTHERAPY ALERT  CARD at check-in to the Emergency Department and triage nurse.  Should you have questions after your visit or need to cancel or reschedule your appointment, please contact [facility] DE[facility]  Dept: [phone]  and follow the prompts.  Office hours are 8:00 a.m. to 4:30 p.m. Monday - Friday. Please note that voicemails left after 4:00 p.m. may not be returned until the following business day.  We are closed weekends and major holidays. You have access to a nurse at all times for urgent questions. Please call the main number to the clinic Dept: [phone] and follow the prompts.   For any non-urgent questions, you may also contact your provider using MyChart. We now offer e-Visits for anyone 52 and older to request care online for non-urgent symptoms. For details visit [email or web address].   Also download the MyChart app! Go to the app store, search MyChart, open the app, select [place], and log in with your MyChart username and password.

## 2023-12-25 NOTE — Progress Notes (Signed)
 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  No nausea/vomiting or neuropathy symptoms.  The rash has improved.  He has developed alopecia.  He complains of altered taste.  The palpable lymph nodes remain improved.  Left leg edema has resolved.  He has a good appetite.  He is exercising and working.  Objective:  Vital signs in last 24 hours:  There were no vitals taken for this visit.    HEENT: Thick white coat over the tongue, no buccal thrush or ulcers Lymphatics: Less than 1 cm high medial left femoral/inguinal node, no right inguinal nodes Resp: Lungs clear bilaterally Cardio: Regular rate and rhythm GI: No hepatosplenomegaly, nontender Vascular: No leg edema  Portacath/PICC-without erythema  Lab Results:  Lab Results  Component Value Date   WBC 4.5 12/18/2023   HGB 12.7 (L) 12/18/2023   HCT 37.0 (L) 12/18/2023   MCV 83.5 12/18/2023   PLT 143 (L) 12/18/2023   NEUTROABS 1.2 (L) 12/18/2023    CMP  Lab Results  Component Value Date   NA 140 12/18/2023   K 3.9 12/18/2023   CL 104 12/18/2023   CO2 22 12/18/2023   GLUCOSE 138 (H) 12/18/2023   BUN 15 12/18/2023   CREATININE 1.07 12/18/2023   CALCIUM 9.1 12/18/2023   PROT 7.0 12/18/2023   ALBUMIN 4.0 12/18/2023   AST 47 (H) 12/18/2023   ALT 63 (H) 12/18/2023   ALKPHOS 69 12/18/2023   BILITOT 0.4 12/18/2023   GFRNONAA >60 12/18/2023    No results found for: CEA1, CEA, CAN199, CA125  Lab Results  Component Value Date   INR 1.0 03/05/2020   LABPROT 13.2 03/05/2020    Imaging:  No results found.  Medications: I have reviewed the patient's current medications.   Assessment/Plan: Metastatic high-grade urothelial carcinoma Presenting with left leg swelling and weakness November 2021 CT abdomen/pelvis 02/17/2020-left hydronephrosis, retroperitoneal adenopathy, scrotal  thickening and edema CT-guided biopsy of left retroperitoneal lymphadenopathy 03/05/2020-poorly differentiated carcinoma consistent with a urothelial primary, PD-L1 combined positive score 100%, tumor mutation burden 26, MSS, ERBB2 amplification Left trigone biopsy 03/21/2020-small foci of infiltrative high-grade urothelial carcinoma with invasion of the muscularis propria and lymphovascular invasion Renal pelvis and left upper tract washing 03/21/2020-high-grade urothelial carcinoma PET 03/21/2020-extensive bilateral retroperitoneal, pelvic, and inguinal hypermetabolic adenopathy, hypermetabolism in the antral region of the stomach with no obvious mass on CT, no evidence of metastatic disease in the neck, chest, or bones Gemcitabine /cisplatin  for 3 cycles beginning 03/27/2020 CT 06/27/2020-marked interval response with some mildly enlarged lymph nodes in the retroperitoneum and pelvis in the range of 1 cm, stranding extending into the base of the penis and suprapubic soft tissue-similar Pembrolizumab  beginning 06/08/2020, changed to every 6-week dosing beginning with cycle 2 CTs 09/18/2021-no evidence of metastatic or recurrent disease in the chest, abdomen, or pelvis, stable 10 mm right pelvic sidewall node mild stranding about the base of the penis PET 05/30/2022-stable compared to 10/18/2021, no evidence of metastatic disease, right pelvic sidewall node described on the 2023 CT is not hypermetabolic Final cycle of pembrolizumab  09/05/2022 PET 12/26/2022-no evidence of recurrent disease PET 08/14/2023-2 enlarging hypermetabolic left inguinal nodes, small retroperitoneal nodes more superiorly are unchanged and are not hypermetabolic Abdomen pelvis 10/30/2023: Increased pathological enlarged left inguinal nodes, new enlarged left external iliac node 11/25/2023: Ultrasound-guided biopsy left inguinal lymph node-metastatic poorly differentiated urothelial carcinoma, p63 and GATA3 positive 11/27/2023 cycle 1  Enfortumab/pembrolizumab , day 8 enfortumab 12/04/2023 12/18/2023 cycle 2 Enfortumab/Pembrolizumab , enfortumab dose reduced due to neutropenia, day 8 enfortumab 12/25/2023   2.  Status post cholecystectomy 3.  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  He is tolerating the enfortumab/pembrolizumab  well.  He has mild neutropenia.  The neutrophil count has not changed significantly over the past week.  He will complete day 8 enfortumab today.  He will call for a fever or symptoms of infection.  He will return for an office visit and cycle 3 enfortumab/pembrolizumab  in 2 weeks.  He will be scheduled for a restaging PET after cycle 3.  He will begin a trial of Magic mouthwash for the altered taste and thick white coat over the tongue.  Arley Hof, MD  12/25/2023  8:06 AM

## 2023-12-25 NOTE — Progress Notes (Signed)
 Patient seen by Dr. Arley Hof today  Vitals are within treatment parameters:Yes OK to proceed w/BP 150/84   Labs are within treatment parameters: No (Please specify and give further instructions.) ANC 1.0--OK to proceed  Treatment plan has been signed: Yes   Per physician team, Patient is ready for treatment and there are NO modifications to the treatment plan.

## 2023-12-26 ENCOUNTER — Other Ambulatory Visit: Payer: Self-pay

## 2024-01-03 ENCOUNTER — Other Ambulatory Visit: Payer: Self-pay | Admitting: Oncology

## 2024-01-05 ENCOUNTER — Other Ambulatory Visit: Payer: Self-pay

## 2024-01-06 ENCOUNTER — Other Ambulatory Visit: Payer: Self-pay

## 2024-01-08 ENCOUNTER — Inpatient Hospital Stay (HOSPITAL_BASED_OUTPATIENT_CLINIC_OR_DEPARTMENT_OTHER): Admitting: Nurse Practitioner

## 2024-01-08 ENCOUNTER — Encounter: Payer: Self-pay | Admitting: Nurse Practitioner

## 2024-01-08 ENCOUNTER — Inpatient Hospital Stay: Attending: Oncology

## 2024-01-08 ENCOUNTER — Other Ambulatory Visit (HOSPITAL_BASED_OUTPATIENT_CLINIC_OR_DEPARTMENT_OTHER): Payer: Self-pay

## 2024-01-08 ENCOUNTER — Inpatient Hospital Stay

## 2024-01-08 VITALS — BP 138/83 | HR 65 | Temp 98.2°F | Resp 18

## 2024-01-08 VITALS — BP 155/82 | HR 71 | Temp 98.3°F | Resp 18 | Ht 72.0 in | Wt 244.3 lb

## 2024-01-08 DIAGNOSIS — R911 Solitary pulmonary nodule: Secondary | ICD-10-CM | POA: Insufficient documentation

## 2024-01-08 DIAGNOSIS — B370 Candidal stomatitis: Secondary | ICD-10-CM

## 2024-01-08 DIAGNOSIS — C679 Malignant neoplasm of bladder, unspecified: Secondary | ICD-10-CM

## 2024-01-08 DIAGNOSIS — Z79899 Other long term (current) drug therapy: Secondary | ICD-10-CM | POA: Diagnosis not present

## 2024-01-08 DIAGNOSIS — D696 Thrombocytopenia, unspecified: Secondary | ICD-10-CM | POA: Insufficient documentation

## 2024-01-08 LAB — CMP (CANCER CENTER ONLY)
ALT: 35 U/L (ref 0–44)
AST: 34 U/L (ref 15–41)
Albumin: 4 g/dL (ref 3.5–5.0)
Alkaline Phosphatase: 71 U/L (ref 38–126)
Anion gap: 11 (ref 5–15)
BUN: 20 mg/dL (ref 8–23)
CO2: 23 mmol/L (ref 22–32)
Calcium: 9.3 mg/dL (ref 8.9–10.3)
Chloride: 103 mmol/L (ref 98–111)
Creatinine: 1.02 mg/dL (ref 0.61–1.24)
GFR, Estimated: >60 mL/min (ref >60)
Glucose, Bld: 156 mg/dL — ABNORMAL HIGH (ref 70–99)
Potassium: 3.8 mmol/L (ref 3.5–5.1)
Sodium: 137 mmol/L (ref 135–145)
Total Bilirubin: 0.4 mg/dL (ref 0.0–1.2)
Total Protein: 7.2 g/dL (ref 6.5–8.1)

## 2024-01-08 LAB — CBC WITH DIFFERENTIAL (CANCER CENTER ONLY)
Abs Immature Granulocytes: 0.01 K/uL (ref 0.00–0.07)
Basophils Absolute: 0.1 K/uL (ref 0.0–0.1)
Basophils Relative: 2 %
Eosinophils Absolute: 0.5 K/uL (ref 0.0–0.5)
Eosinophils Relative: 11 %
HCT: 37.9 % — ABNORMAL LOW (ref 39.0–52.0)
Hemoglobin: 13.2 g/dL (ref 13.0–17.0)
Immature Granulocytes: 0 %
Lymphocytes Relative: 39 %
Lymphs Abs: 1.8 K/uL (ref 0.7–4.0)
MCH: 28.9 pg (ref 26.0–34.0)
MCHC: 34.8 g/dL (ref 30.0–36.0)
MCV: 82.9 fL (ref 80.0–100.0)
Monocytes Absolute: 0.6 K/uL (ref 0.1–1.0)
Monocytes Relative: 13 %
Neutro Abs: 1.6 K/uL — ABNORMAL LOW (ref 1.7–7.7)
Neutrophils Relative %: 35 %
Platelet Count: 143 K/uL — ABNORMAL LOW (ref 150–400)
RBC: 4.57 MIL/uL (ref 4.22–5.81)
RDW: 13.9 % (ref 11.5–15.5)
WBC Count: 4.7 K/uL (ref 4.0–10.5)
nRBC: 0 % (ref 0.0–0.2)

## 2024-01-08 LAB — TSH: TSH: 4.12 u[IU]/mL (ref 0.350–4.500)

## 2024-01-08 MED ORDER — SODIUM CHLORIDE 0.9 % IV SOLN
200.0000 mg | Freq: Once | INTRAVENOUS | Status: AC
  Administered 2024-01-08: 200 mg via INTRAVENOUS
  Filled 2024-01-08: qty 8

## 2024-01-08 MED ORDER — SODIUM CHLORIDE 0.9 % IV SOLN
0.9000 mg/kg | Freq: Once | INTRAVENOUS | Status: AC
  Administered 2024-01-08: 100 mg via INTRAVENOUS
  Filled 2024-01-08: qty 6

## 2024-01-08 MED ORDER — PROCHLORPERAZINE MALEATE 10 MG PO TABS
10.0000 mg | ORAL_TABLET | Freq: Once | ORAL | Status: AC
  Administered 2024-01-08: 10 mg via ORAL
  Filled 2024-01-08: qty 1

## 2024-01-08 MED ORDER — NYSTATIN 100000 UNIT/ML MT SUSP
5.0000 mL | Freq: Four times a day (QID) | OROMUCOSAL | 0 refills | Status: AC
  Filled 2024-01-08: qty 140, 7d supply, fill #0

## 2024-01-08 MED ORDER — PANTOPRAZOLE SODIUM 20 MG PO TBEC
20.0000 mg | DELAYED_RELEASE_TABLET | Freq: Every day | ORAL | 1 refills | Status: AC
  Filled 2024-01-08: qty 30, 30d supply, fill #0

## 2024-01-08 MED ORDER — SODIUM CHLORIDE 0.9 % IV SOLN: INTRAVENOUS | Status: DC

## 2024-01-08 NOTE — Progress Notes (Signed)
 Patient seen by Olam Ned NP today  Vitals are within treatment parameters:Yes   Labs are within treatment parameters: No (Please specify and give further instructions.)  ANC 1.6--OK to treat  Treatment plan has been signed: Yes   Per physician team, Patient is ready for treatment and there are NO modifications to the treatment plan.

## 2024-01-08 NOTE — Progress Notes (Signed)
 [facility] OFFICE PROGRESS NOTE   Diagnosis: Bladder cancer  INTERVAL HISTORY:   Edward [name] returns as scheduled.  He completed cycle 2-day 8 enfortumab 12/25/2023.  He denies nausea/vomiting.  No mouth sores.  No diarrhea.  He notes hyperpigmentation in both axilla.  Previous rash has resolved.  No longer having hair loss.  Continued alteration in taste.  No improvement after trying Magic mouthwash for a few days.  No eye symptoms.  No neuropathy.  He reports recent heartburn after eating.  He tried Tums and omeprazole over-the-counter for a few days with no improvement.  He would like a different proton pump inhibitor.  Objective:  Vital signs in last 24 hours:  Blood pressure (!) 155/82, pulse 71, temperature 98.3 F (36.8 C), temperature source Temporal, resp. rate 18, height 6' (1.829 m), weight 244 lb 4.8 oz (110.8 kg), SpO2 100%.    HEENT: White coating over tongue.  No buccal thrush.  No ulcers. Lymphatics: Firm 1/2 to 1 cm high medial left femoral/inguinal node.  No right inguinal nodes. Resp: Lungs clear bilaterally. Cardio: Regular rate and rhythm. GI: No hepatosplenomegaly. Vascular: No leg edema. Skin: Hyperpigmentation bilateral axilla.  No other rash. Port-A-Cath without erythema.  Lab Results:  Lab Results  Component Value Date   WBC 4.7 01/08/2024   HGB 13.2 01/08/2024   HCT 37.9 (L) 01/08/2024   MCV 82.9 01/08/2024   PLT 143 (L) 01/08/2024   NEUTROABS 1.6 (L) 01/08/2024    Imaging:  No results found.  Medications: I have reviewed the patient's current medications.  Assessment/Plan: Metastatic high-grade urothelial carcinoma Presenting with left leg swelling and weakness November 2021 CT abdomen/pelvis 02/17/2020-left hydronephrosis, retroperitoneal adenopathy, scrotal thickening and edema CT-guided biopsy of left retroperitoneal lymphadenopathy 03/05/2020-poorly differentiated carcinoma consistent with a urothelial primary, PD-L1  combined positive score 100%, tumor mutation burden 26, MSS, ERBB2 amplification Left trigone biopsy 03/21/2020-small foci of infiltrative high-grade urothelial carcinoma with invasion of the muscularis propria and lymphovascular invasion Renal pelvis and left upper tract washing 03/21/2020-high-grade urothelial carcinoma PET 03/21/2020-extensive bilateral retroperitoneal, pelvic, and inguinal hypermetabolic adenopathy, hypermetabolism in the antral region of the stomach with no obvious mass on CT, no evidence of metastatic disease in the neck, chest, or bones Gemcitabine /cisplatin  for 3 cycles beginning 03/27/2020 CT 06/27/2020-marked interval response with some mildly enlarged lymph nodes in the retroperitoneum and pelvis in the range of 1 cm, stranding extending into the base of the penis and suprapubic soft tissue-similar Pembrolizumab  beginning 06/08/2020, changed to every 6-week dosing beginning with cycle 2 CTs 09/18/2021-no evidence of metastatic or recurrent disease in the chest, abdomen, or pelvis, stable 10 mm right pelvic sidewall node mild stranding about the base of the penis PET 05/30/2022-stable compared to 10/18/2021, no evidence of metastatic disease, right pelvic sidewall node described on the 2023 CT is not hypermetabolic Final cycle of pembrolizumab  09/05/2022 PET 12/26/2022-no evidence of recurrent disease PET 08/14/2023-2 enlarging hypermetabolic left inguinal nodes, small retroperitoneal nodes more superiorly are unchanged and are not hypermetabolic Abdomen pelvis 10/30/2023: Increased pathological enlarged left inguinal nodes, new enlarged left external iliac node 11/25/2023: Ultrasound-guided biopsy left inguinal lymph node-metastatic poorly differentiated urothelial carcinoma, p63 and GATA3 positive 11/27/2023 cycle 1 Enfortumab/pembrolizumab , day 8 enfortumab 12/04/2023 12/18/2023 cycle 2 Enfortumab/Pembrolizumab , enfortumab dose reduced due to neutropenia, day 8 enfortumab  12/25/2023 01/08/2024 cycle 3 enfortumab/Pembrolizumab  01/08/2024   2.  Status post cholecystectomy 3.  Chronic mild thrombocytopenia following chemotherapy    Disposition: Edward [name] appears stable.  He has completed  2 cycles of enfortumab/pembrolizumab .  He is tolerating treatment well.  Palpable adenopathy is better.  Plan to proceed with cycle 3-day 1 today as scheduled.  Restaging PET scan after this cycle.  CBC and chemistry panel reviewed.  Labs adequate for treatment.  He has persistent stable mild neutropenia and thrombocytopenia.  AST/ALT normal.  He reports persistent alteration in taste.  He has a white coating over his tongue.  He will try nystatin  mouth rinse.  Referral placed to the [facility] dietitian as well.  He is having reflux symptoms after eating.  No improvement with omeprazole.  He would like to try Protonix.  Prescription sent to the pharmacy.  He will return for follow-up and day 8 enfortumab in 1 week.  We are available to see him sooner if needed.    Olam Ned ANP/GNP-BC   01/08/2024  9:30 AM

## 2024-01-08 NOTE — Patient Instructions (Signed)
 [facility] - A DEPT OF . [facility]  Discharge Instructions: Thank you for choosing [facility] to provide your oncology and hematology care.   If you have a lab appointment with the Cancer Center, please go directly to the Cancer Center and check in at the registration area.   Wear comfortable clothing and clothing appropriate for easy access to any Portacath or PICC line.   We strive to give you quality time with your provider. You may need to reschedule your appointment if you arrive late (15 or more minutes).  Arriving late affects you and other patients whose appointments are after yours.  Also, if you miss three or more appointments without notifying the office, you may be dismissed from the clinic at the provider's discretion.      For prescription refill requests, have your pharmacy contact our office and allow 72 hours for refills to be completed.    Today you received the following chemotherapy and/or immunotherapy agents: padcev  and keytruda       To help prevent nausea and vomiting after your treatment, we encourage you to take your nausea medication as directed.  BELOW ARE SYMPTOMS THAT SHOULD BE REPORTED IMMEDIATELY: *FEVER GREATER THAN 100.4 F (38 C) OR HIGHER *CHILLS OR SWEATING *NAUSEA AND VOMITING THAT IS NOT CONTROLLED WITH YOUR NAUSEA MEDICATION *UNUSUAL SHORTNESS OF BREATH *UNUSUAL BRUISING OR BLEEDING *URINARY PROBLEMS (pain or burning when urinating, or frequent urination) *BOWEL PROBLEMS (unusual diarrhea, constipation, pain near the anus) TENDERNESS IN MOUTH AND THROAT WITH OR WITHOUT PRESENCE OF ULCERS (sore throat, sores in mouth, or a toothache) UNUSUAL RASH, SWELLING OR PAIN  UNUSUAL VAGINAL DISCHARGE OR ITCHING   Items with * indicate a potential emergency and should be followed up as soon as possible or go to the Emergency Department if any problems should occur.  Please show the CHEMOTHERAPY ALERT CARD or  IMMUNOTHERAPY ALERT CARD at check-in to the Emergency Department and triage nurse.  Should you have questions after your visit or need to cancel or reschedule your appointment, please contact [facility]  Dept: [phone]  and follow the prompts.  Office hours are 8:00 a.m. to 4:30 p.m. Monday - Friday. Please note that voicemails left after 4:00 p.m. may not be returned until the following business day.  We are closed weekends and major holidays. You have access to a nurse at all times for urgent questions. Please call the main number to the clinic Dept: [phone] and follow the prompts.   For any non-urgent questions, you may also contact your provider using MyChart. We now offer e-Visits for anyone 30 and older to request care online for non-urgent symptoms. For details visit [email or web address].   Also download the MyChart app! Go to the app store, search MyChart, open the app, select [place], and log in with your MyChart username and password.

## 2024-01-08 NOTE — Patient Instructions (Signed)

## 2024-01-09 LAB — T4: T4, Total: 7.8 ug/dL (ref 4.5–12.0)

## 2024-01-13 ENCOUNTER — Telehealth: Payer: Self-pay | Admitting: Nurse Practitioner

## 2024-01-13 NOTE — Telephone Encounter (Signed)
 Called PT to schedule nutrition appt. Day and time confirmed.

## 2024-01-15 ENCOUNTER — Inpatient Hospital Stay

## 2024-01-15 ENCOUNTER — Other Ambulatory Visit (HOSPITAL_BASED_OUTPATIENT_CLINIC_OR_DEPARTMENT_OTHER): Payer: Self-pay

## 2024-01-15 ENCOUNTER — Inpatient Hospital Stay (HOSPITAL_BASED_OUTPATIENT_CLINIC_OR_DEPARTMENT_OTHER): Admitting: Oncology

## 2024-01-15 VITALS — BP 145/86 | HR 69 | Temp 98.0°F | Resp 18 | Ht 72.0 in | Wt 238.9 lb

## 2024-01-15 VITALS — BP 148/87 | HR 68 | Resp 18

## 2024-01-15 DIAGNOSIS — C679 Malignant neoplasm of bladder, unspecified: Secondary | ICD-10-CM

## 2024-01-15 LAB — CMP (CANCER CENTER ONLY)
ALT: 34 U/L (ref 0–44)
AST: 36 U/L (ref 15–41)
Albumin: 4.2 g/dL (ref 3.5–5.0)
Alkaline Phosphatase: 73 U/L (ref 38–126)
Anion gap: 14 (ref 5–15)
BUN: 22 mg/dL (ref 8–23)
CO2: 21 mmol/L — ABNORMAL LOW (ref 22–32)
Calcium: 9.5 mg/dL (ref 8.9–10.3)
Chloride: 103 mmol/L (ref 98–111)
Creatinine: 1 mg/dL (ref 0.61–1.24)
GFR, Estimated: >60 mL/min (ref >60)
Glucose, Bld: 162 mg/dL — ABNORMAL HIGH (ref 70–99)
Potassium: 3.6 mmol/L (ref 3.5–5.1)
Sodium: 138 mmol/L (ref 135–145)
Total Bilirubin: 0.5 mg/dL (ref 0.0–1.2)
Total Protein: 7.4 g/dL (ref 6.5–8.1)

## 2024-01-15 LAB — CBC WITH DIFFERENTIAL (CANCER CENTER ONLY)
Abs Immature Granulocytes: 0.01 K/uL (ref 0.00–0.07)
Basophils Absolute: 0.1 K/uL (ref 0.0–0.1)
Basophils Relative: 2 %
Eosinophils Absolute: 0.1 K/uL (ref 0.0–0.5)
Eosinophils Relative: 2 %
HCT: 39.5 % (ref 39.0–52.0)
Hemoglobin: 13.7 g/dL (ref 13.0–17.0)
Immature Granulocytes: 0 %
Lymphocytes Relative: 36 %
Lymphs Abs: 1.4 K/uL (ref 0.7–4.0)
MCH: 28.7 pg (ref 26.0–34.0)
MCHC: 34.7 g/dL (ref 30.0–36.0)
MCV: 82.6 fL (ref 80.0–100.0)
Monocytes Absolute: 0.7 K/uL (ref 0.1–1.0)
Monocytes Relative: 18 %
Neutro Abs: 1.6 K/uL — ABNORMAL LOW (ref 1.7–7.7)
Neutrophils Relative %: 42 %
Platelet Count: 149 K/uL — ABNORMAL LOW (ref 150–400)
RBC: 4.78 MIL/uL (ref 4.22–5.81)
RDW: 14.2 % (ref 11.5–15.5)
WBC Count: 3.9 K/uL — ABNORMAL LOW (ref 4.0–10.5)
nRBC: 0 % (ref 0.0–0.2)

## 2024-01-15 MED ORDER — PROCHLORPERAZINE MALEATE 10 MG PO TABS
10.0000 mg | ORAL_TABLET | Freq: Once | ORAL | Status: AC
  Administered 2024-01-15: 10 mg via ORAL
  Filled 2024-01-15: qty 1

## 2024-01-15 MED ORDER — FAMOTIDINE IN NACL 20-0.9 MG/50ML-% IV SOLN
20.0000 mg | Freq: Once | INTRAVENOUS | Status: AC
  Administered 2024-01-15: 20 mg via INTRAVENOUS
  Filled 2024-01-15: qty 50

## 2024-01-15 MED ORDER — FAMOTIDINE 20 MG PO TABS
20.0000 mg | ORAL_TABLET | Freq: Every day | ORAL | 2 refills | Status: AC
  Filled 2024-01-15: qty 30, 30d supply, fill #0

## 2024-01-15 MED ORDER — SODIUM CHLORIDE 0.9 % IV SOLN
0.9000 mg/kg | Freq: Once | INTRAVENOUS | Status: AC
  Administered 2024-01-15: 100 mg via INTRAVENOUS
  Filled 2024-01-15: qty 6

## 2024-01-15 MED ORDER — SODIUM CHLORIDE 0.9 % IV SOLN: INTRAVENOUS | Status: DC

## 2024-01-15 NOTE — Patient Instructions (Signed)

## 2024-01-15 NOTE — Progress Notes (Signed)
  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  He reports altered taste.  No new rash.  No diarrhea.  No neuropathy symptoms.  He feels well.  He relates weight loss to intermittent fasting and loss of taste.  He can no longer palpate groin lymph nodes.  He would like to try an antihistamine to help with the enfortumab rash which is worse after day 8 therapy.  He took nystatin  for a few doses after the last office visit here.  This did not help the altered taste.  Objective:  Vital signs in last 24 hours:  Blood pressure (!) 145/86, pulse 69, temperature 98 F (36.7 C), temperature source Temporal, resp. rate 18, height 6' (1.829 m), weight 238 lb 14.4 oz (108.4 kg), SpO2 100%.    HEENT: White coat over the tongue, no buccal thrush Lymphatics: Slight firm fullness at the medial left inguinal region without a discrete lymph node, no other palpable inguinal or femoral nodes Resp: Lungs clear bilaterally Cardio: Regular rate and rhythm GI: No hepatomegaly Vascular: No leg edema  Skin: Faint hyperpigmentation at the lower abdomen  Portacath/PICC-without erythema  Lab Results:  Lab Results  Component Value Date   WBC 3.9 (L) 01/15/2024   HGB 13.7 01/15/2024   HCT 39.5 01/15/2024   MCV 82.6 01/15/2024   PLT 149 (L) 01/15/2024   NEUTROABS 1.6 (L) 01/15/2024    CMP  Lab Results  Component Value Date   NA 138 01/15/2024   K 3.6 01/15/2024   CL 103 01/15/2024   CO2 21 (L) 01/15/2024   GLUCOSE 162 (H) 01/15/2024   BUN 22 01/15/2024   CREATININE 1.00 01/15/2024   CALCIUM 9.5 01/15/2024   PROT 7.4 01/15/2024   ALBUMIN 4.2 01/15/2024   AST 36 01/15/2024   ALT 34 01/15/2024   ALKPHOS 73 01/15/2024   BILITOT 0.5 01/15/2024   GFRNONAA >60 01/15/2024     Medications: I have reviewed the patient's current  medications.   Assessment/Plan: Metastatic high-grade urothelial carcinoma Presenting with left leg swelling and weakness November 2021 CT abdomen/pelvis 02/17/2020-left hydronephrosis, retroperitoneal adenopathy, scrotal thickening and edema CT-guided biopsy of left retroperitoneal lymphadenopathy 03/05/2020-poorly differentiated carcinoma consistent with a urothelial primary, PD-L1 combined positive score 100%, tumor mutation burden 26, MSS, ERBB2 amplification Left trigone biopsy 03/21/2020-small foci of infiltrative high-grade urothelial carcinoma with invasion of the muscularis propria and lymphovascular invasion Renal pelvis and left upper tract washing 03/21/2020-high-grade urothelial carcinoma PET 03/21/2020-extensive bilateral retroperitoneal, pelvic, and inguinal hypermetabolic adenopathy, hypermetabolism in the antral region of the stomach with no obvious mass on CT, no evidence of metastatic disease in the neck, chest, or bones Gemcitabine /cisplatin  for 3 cycles beginning 03/27/2020 CT 06/27/2020-marked interval response with some mildly enlarged lymph nodes in the retroperitoneum and pelvis in the range of 1 cm, stranding extending into the base of the penis and suprapubic soft tissue-similar Pembrolizumab  beginning 06/08/2020, changed to every 6-week dosing beginning with cycle 2 CTs 09/18/2021-no evidence of metastatic or recurrent disease in the chest, abdomen, or pelvis, stable 10 mm right pelvic sidewall node mild stranding about the base of the penis PET 05/30/2022-stable compared to 10/18/2021, no evidence of metastatic disease, right pelvic sidewall node described on the 2023 CT is not hypermetabolic Final cycle of pembrolizumab  09/05/2022 PET 12/26/2022-no evidence of recurrent disease PET 08/14/2023-2 enlarging hypermetabolic left inguinal nodes, small retroperitoneal nodes more superiorly are unchanged and are  not hypermetabolic Abdomen pelvis 10/30/2023: Increased pathological  enlarged left inguinal nodes, new enlarged left external iliac node 11/25/2023: Ultrasound-guided biopsy left inguinal lymph node-metastatic poorly differentiated urothelial carcinoma, p63 and GATA3 positive: NGS testing: TERT, T p53, EEBB2-copy number gain, MSS, tumor mutation burden low to intermediate  11/27/2023 cycle 1 Enfortumab/pembrolizumab , day 8 enfortumab 12/04/2023 12/18/2023 cycle 2 Enfortumab/Pembrolizumab , enfortumab dose reduced due to neutropenia, day 8 enfortumab 12/25/2023 01/08/2024 cycle 3 enfortumab/Pembrolizumab  01/08/2024   2.  Status post cholecystectomy 3.  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  He will complete day 8 cycle 3 enfortumab/pembrolizumab  today.  He is tolerating treatment well.  He has a mild rash at the lower abdomen.  He requests antihistamine therapy in an attempt to prevent worsening of the rash.  I will add Pepcid.  He will undergo a restaging PET next week.  He is scheduled for an office visit in 2 weeks.  I encouraged him to increase his calorie intake.  He will try nystatin  consistently for the next week.  The loss of taste may be related to enfortumab.  The neutrophil count is adequate to proceed with chemotherapy today.  We reviewed results of molecular testing from the left inguinal lymph node biopsy.  Arley Hof, MD  01/15/2024  10:15 AM

## 2024-01-15 NOTE — Patient Instructions (Signed)
 [facility] - A DEPT OF [facility]  Discharge Instructions: Thank you for choosing [facility] to provide your oncology and hematology care.   If you have a lab appointment with the Cancer Center, please go directly to the Cancer Center and check in at the registration area.   Wear comfortable clothing and clothing appropriate for easy access to any Portacath or PICC line.   We strive to give you quality time with your provider. You may need to reschedule your appointment if you arrive late (15 or more minutes).  Arriving late affects you and other patients whose appointments are after yours.  Also, if you miss three or more appointments without notifying the office, you may be dismissed from the clinic at the provider's discretion.      For prescription refill requests, have your pharmacy contact our office and allow 72 hours for refills to be completed.    Today you received the following chemotherapy and/or immunotherapy agents: padcev    To help prevent nausea and vomiting after your treatment, we encourage you to take your nausea medication as directed.  BELOW ARE SYMPTOMS THAT SHOULD BE REPORTED IMMEDIATELY: *FEVER GREATER THAN 100.4 F (38 C) OR HIGHER *CHILLS OR SWEATING *NAUSEA AND VOMITING THAT IS NOT CONTROLLED WITH YOUR NAUSEA MEDICATION *UNUSUAL SHORTNESS OF BREATH *UNUSUAL BRUISING OR BLEEDING *URINARY PROBLEMS (pain or burning when urinating, or frequent urination) *BOWEL PROBLEMS (unusual diarrhea, constipation, pain near the anus) TENDERNESS IN MOUTH AND THROAT WITH OR WITHOUT PRESENCE OF ULCERS (sore throat, sores in mouth, or a toothache) UNUSUAL RASH, SWELLING OR PAIN  UNUSUAL VAGINAL DISCHARGE OR ITCHING   Items with * indicate a potential emergency and should be followed up as soon as possible or go to the Emergency Department if any problems should occur.  Please show the CHEMOTHERAPY ALERT CARD or IMMUNOTHERAPY ALERT  CARD at check-in to the Emergency Department and triage nurse.  Should you have questions after your visit or need to cancel or reschedule your appointment, please contact [facility] DE[facility]  Dept: [phone]  and follow the prompts.  Office hours are 8:00 a.m. to 4:30 p.m. Monday - Friday. Please note that voicemails left after 4:00 p.m. may not be returned until the following business day.  We are closed weekends and major holidays. You have access to a nurse at all times for urgent questions. Please call the main number to the clinic Dept: [phone] and follow the prompts.   For any non-urgent questions, you may also contact your provider using MyChart. We now offer e-Visits for anyone 52 and older to request care online for non-urgent symptoms. For details visit [email or web address].   Also download the MyChart app! Go to the app store, search MyChart, open the app, select [place], and log in with your MyChart username and password.

## 2024-01-15 NOTE — Progress Notes (Signed)
 Patient seen by Dr. Arley Hof today  Vitals are within treatment parameters:Yes   Labs are within treatment parameters: No (Please specify and give further instructions.)  OK to proceed w/ANC 1.6  Treatment plan has been signed: Yes   Per physician team, Patient is ready for treatment. Please note the following modifications: Adding pepcid as premed

## 2024-01-18 ENCOUNTER — Telehealth: Payer: Self-pay | Admitting: *Deleted

## 2024-01-18 ENCOUNTER — Inpatient Hospital Stay: Admitting: Nutrition

## 2024-01-18 ENCOUNTER — Other Ambulatory Visit: Payer: Self-pay | Admitting: *Deleted

## 2024-01-18 DIAGNOSIS — C679 Malignant neoplasm of bladder, unspecified: Secondary | ICD-10-CM

## 2024-01-18 NOTE — Progress Notes (Signed)
 63 year old male diagnosed with Metastatic Bladder Cancer and followed by Dr. Cloretta.  Treatment plan includes enfortumab/pembrolizumab  .  PMH includes Kidney Stones, Edema  Medications include Vitamin D3, Magic mouthwash, Protonix and Pepcid.  Labs include Glucose 162 on Oct 10.  Height: 6 Weight: 238 pounds 14.4 oz. UBW: 275 pounds in 2021 BMI: 32.4 4% weight loss over one month, not clinically significant but concerning  He feels well overall. Reports ongoing taste alterations and reflux/heartburn. Foods and liquids such as tea and water  taste bitter. He tried magic mouthwash but it did not help with taste. He occasionally includes intermittent fasting from sunrise to sunset and has practiced this for years. He feels good after fasting. He acknowledges weight loss and attributes it to taste alterations and fasting.He has good energy.  Nutrition Diagnosis: Food and Nutrition Related Knowledge Deficit related to cancer and associated treatments as evidenced by no prior need for nutrition related information  Intervention: Educated to begin using baking soda and salt water  gargle before eating and first thing in the morning and before bed. Provided additional tips on bitter taste.  Gave purchasing information on MetaQil (oral rinse for taste disorder) Provided handout and ordering information via email.  Monitoring, Evaluation, Goals: Tolerate adequate calories and protein to minimize weight loss/see improvement in taste alterations.  Next Visit: To be scheduled as needed.

## 2024-01-18 NOTE — Telephone Encounter (Signed)
 error

## 2024-01-20 ENCOUNTER — Other Ambulatory Visit: Payer: Self-pay

## 2024-01-22 ENCOUNTER — Ambulatory Visit (HOSPITAL_COMMUNITY)
Admission: RE | Admit: 2024-01-22 | Discharge: 2024-01-22 | Disposition: A | Discharge status: Home / Self Care | Source: Ambulatory Visit | Attending: Oncology | Admitting: Oncology

## 2024-01-22 ENCOUNTER — Other Ambulatory Visit: Payer: Self-pay

## 2024-01-22 ENCOUNTER — Other Ambulatory Visit: Payer: Self-pay | Admitting: Oncology

## 2024-01-22 DIAGNOSIS — C679 Malignant neoplasm of bladder, unspecified: Secondary | ICD-10-CM | POA: Insufficient documentation

## 2024-01-22 LAB — GLUCOSE, CAPILLARY: Glucose-Capillary: 126 mg/dL — ABNORMAL HIGH (ref 70–99)

## 2024-01-22 MED ORDER — FLUDEOXYGLUCOSE F - 18 (FDG) INJECTION
12.0000 | Freq: Once | INTRAVENOUS | Status: AC | PRN
  Administered 2024-01-22: 11.8 via INTRAVENOUS

## 2024-01-29 ENCOUNTER — Inpatient Hospital Stay

## 2024-01-29 ENCOUNTER — Inpatient Hospital Stay (HOSPITAL_BASED_OUTPATIENT_CLINIC_OR_DEPARTMENT_OTHER): Admitting: Oncology

## 2024-01-29 VITALS — BP 151/78 | HR 80 | Temp 97.8°F | Resp 18 | Ht 72.0 in | Wt 237.6 lb

## 2024-01-29 VITALS — BP 133/75 | HR 68 | Resp 18

## 2024-01-29 DIAGNOSIS — C679 Malignant neoplasm of bladder, unspecified: Secondary | ICD-10-CM

## 2024-01-29 LAB — CBC WITH DIFFERENTIAL (CANCER CENTER ONLY)
Abs Immature Granulocytes: 0.02 K/uL (ref 0.00–0.07)
Basophils Absolute: 0.1 K/uL (ref 0.0–0.1)
Basophils Relative: 1 %
Eosinophils Absolute: 1.3 K/uL — ABNORMAL HIGH (ref 0.0–0.5)
Eosinophils Relative: 18 %
HCT: 38.7 % — ABNORMAL LOW (ref 39.0–52.0)
Hemoglobin: 13.3 g/dL (ref 13.0–17.0)
Immature Granulocytes: 0 %
Lymphocytes Relative: 21 %
Lymphs Abs: 1.5 K/uL (ref 0.7–4.0)
MCH: 28.7 pg (ref 26.0–34.0)
MCHC: 34.4 g/dL (ref 30.0–36.0)
MCV: 83.4 fL (ref 80.0–100.0)
Monocytes Absolute: 0.9 K/uL (ref 0.1–1.0)
Monocytes Relative: 13 %
Neutro Abs: 3.3 K/uL (ref 1.7–7.7)
Neutrophils Relative %: 47 %
Platelet Count: 162 K/uL (ref 150–400)
RBC: 4.64 MIL/uL (ref 4.22–5.81)
RDW: 15 % (ref 11.5–15.5)
WBC Count: 7.2 K/uL (ref 4.0–10.5)
nRBC: 0 % (ref 0.0–0.2)

## 2024-01-29 LAB — CMP (CANCER CENTER ONLY)
ALT: 35 U/L (ref 0–44)
AST: 31 U/L (ref 15–41)
Albumin: 4.1 g/dL (ref 3.5–5.0)
Alkaline Phosphatase: 71 U/L (ref 38–126)
Anion gap: 10 (ref 5–15)
BUN: 22 mg/dL (ref 8–23)
CO2: 23 mmol/L (ref 22–32)
Calcium: 9 mg/dL (ref 8.9–10.3)
Chloride: 104 mmol/L (ref 98–111)
Creatinine: 1.06 mg/dL (ref 0.61–1.24)
GFR, Estimated: >60 mL/min (ref >60)
Glucose, Bld: 149 mg/dL — ABNORMAL HIGH (ref 70–99)
Potassium: 3.7 mmol/L (ref 3.5–5.1)
Sodium: 137 mmol/L (ref 135–145)
Total Bilirubin: 0.5 mg/dL (ref 0.0–1.2)
Total Protein: 7.3 g/dL (ref 6.5–8.1)

## 2024-01-29 MED ORDER — SODIUM CHLORIDE 0.9 % IV SOLN: INTRAVENOUS | Status: DC

## 2024-01-29 MED ORDER — SODIUM CHLORIDE 0.9 % IV SOLN
200.0000 mg | Freq: Once | INTRAVENOUS | Status: AC
  Administered 2024-01-29: 200 mg via INTRAVENOUS
  Filled 2024-01-29: qty 8

## 2024-01-29 NOTE — Progress Notes (Signed)
 Patient seen by Dr. Arley Hof today  Vitals are within treatment parameters:Yes  OK to proceed w/BP 151/78  Labs are within treatment parameters: Yes   Treatment plan has been signed: Yes   Per physician team, Patient is ready for treatment. Please note the following modifications: MD has removed premeds and Padcev  from care plan.

## 2024-01-29 NOTE — Progress Notes (Signed)
 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  Edward [name] has developed increased tingling in the extremities.  Edward [name] has altered taste.  Edward [name] reports a good appetite.  Edward [name] has increased fatigue.  Edward [name] is exercising.  There is a crusted lesion over the parietal scalp.  Edward [name] has intermittent erythematous lesions over the extremities  Objective:  Vital signs in last 24 hours:  Blood pressure (!) 151/78, pulse 80, temperature 97.8 F (36.6 C), temperature source Temporal, resp. rate 18, height 6' (1.829 m), weight 237 lb 9.6 oz (107.8 kg), SpO2 100%.    HEENT: No thrush or ulcers Lymphatics: No cervical, supraclavicular, or axillary nodes, no discrete inguinal or femoral nodes Resp: Lungs clear bilaterally Cardio: Regular rate and rhythm GI: No hepatosplenomegaly Vascular: No leg edema  Skin: No alopecia, hyperpigmented slightly raised crusted lesion over the parietal scalp with mild surrounding erythema, few 3-4 mm erythematous lesions over the extremities  Portacath/PICC-without erythema  Lab Results:  Lab Results  Component Value Date   WBC 7.2 01/29/2024   HGB 13.3 01/29/2024   HCT 38.7 (L) 01/29/2024   MCV 83.4 01/29/2024   PLT 162 01/29/2024   NEUTROABS 3.3 01/29/2024    CMP  Lab Results  Component Value Date   NA 137 01/29/2024   K 3.7 01/29/2024   CL 104 01/29/2024   CO2 23 01/29/2024   GLUCOSE 149 (H) 01/29/2024   BUN 22 01/29/2024   CREATININE 1.06 01/29/2024   CALCIUM 9.0 01/29/2024   PROT 7.3 01/29/2024   ALBUMIN 4.1 01/29/2024   AST 31 01/29/2024   ALT 35 01/29/2024   ALKPHOS 71 01/29/2024   BILITOT 0.5 01/29/2024   GFRNONAA >60 01/29/2024   Medications: I have reviewed the patient's current medications.   Assessment/Plan: Metastatic high-grade urothelial carcinoma Presenting with left leg swelling and weakness November  2021 CT abdomen/pelvis 02/17/2020-left hydronephrosis, retroperitoneal adenopathy, scrotal thickening and edema CT-guided biopsy of left retroperitoneal lymphadenopathy 03/05/2020-poorly differentiated carcinoma consistent with a urothelial primary, PD-L1 combined positive score 100%, tumor mutation burden 26, MSS, ERBB2 amplification Left trigone biopsy 03/21/2020-small foci of infiltrative high-grade urothelial carcinoma with invasion of the muscularis propria and lymphovascular invasion Renal pelvis and left upper tract washing 03/21/2020-high-grade urothelial carcinoma PET 03/21/2020-extensive bilateral retroperitoneal, pelvic, and inguinal hypermetabolic adenopathy, hypermetabolism in the antral region of the stomach with no obvious mass on CT, no evidence of metastatic disease in the neck, chest, or bones Gemcitabine /cisplatin  for 3 cycles beginning 03/27/2020 CT 06/27/2020-marked interval response with some mildly enlarged lymph nodes in the retroperitoneum and pelvis in the range of 1 cm, stranding extending into the base of the penis and suprapubic soft tissue-similar Pembrolizumab  beginning 06/08/2020, changed to every 6-week dosing beginning with cycle 2 CTs 09/18/2021-no evidence of metastatic or recurrent disease in the chest, abdomen, or pelvis, stable 10 mm right pelvic sidewall node mild stranding about the base of the penis PET 05/30/2022-stable compared to 10/18/2021, no evidence of metastatic disease, right pelvic sidewall node described on the 2023 CT is not hypermetabolic Final cycle of pembrolizumab  09/05/2022 PET 12/26/2022-no evidence of recurrent disease PET 08/14/2023-2 enlarging hypermetabolic left inguinal nodes, small retroperitoneal nodes more superiorly are unchanged and are not hypermetabolic Abdomen pelvis 10/30/2023: Increased pathological enlarged left inguinal nodes, new enlarged left external iliac node 11/25/2023: Ultrasound-guided biopsy left inguinal lymph node-metastatic  poorly differentiated urothelial carcinoma, p63 and GATA3 positive: NGS testing: TERT, T  p53, EEBB2-copy number gain, MSS, tumor mutation burden low to intermediate  11/27/2023 cycle 1 Enfortumab/pembrolizumab , day 8 enfortumab 12/04/2023 12/18/2023 cycle 2 Enfortumab/Pembrolizumab , enfortumab dose reduced due to neutropenia, day 8 enfortumab 12/25/2023 01/08/2024 cycle 3 enfortumab/Pembrolizumab  01/08/2024, day 8 enfortumab 01/15/2024 01/22/2024 PET: Decreased size of left inguinal lymph nodes-no longer hypermetabolic, 6 mm mildly hypermetabolic left paratracheal node, 7 mm left periaortic node with an SUV below blood pool,, no other evidence of metastatic disease.  Tiny right upper lobe nodule is too small PET resolution, subpleural ground glass in the left upper lobe   2.  Status post cholecystectomy 3.  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  Edward [name] has completed 3 cycles of enfortumab/pembrolizumab .  The palpable left inguinal/femoral nodes are significantly smaller (I cannot definitively palpate any lymph nodes today) and are no longer hypermetabolic on PET imaging.  The significance of the nonpathologically enlarged paratracheal and periaortic nodes is unclear.  I reviewed the PET and recent CT images with Dr.Demerci and his wife. Edward [name] has developed alopecia, neuropathy, altered taste, and skin lesions.  Edward [name] relates these findings to enfortumab.  Edward [name] does not wish to receive further enfortumab.  The plan is to continue treatment with single agent pembrolizumab .  Edward [name] understands the urothelial carcinoma will likely progress in the future.  I doubt the scalp lesion is related to pembrolizumab  or enfortumab.  Edward [name] is scheduled for a dermatology evaluation.  The alopecia, altered taste, and neuropathy symptoms may improve following discontinuation of enfortumab.  Edward [name] will complete another treatment with pembrolizumab  today.  Edward [name]  will return for an office visit and pembrolizumab  in 3 weeks.   Edward Hof, MD  01/29/2024  3:25 PM

## 2024-01-29 NOTE — Patient Instructions (Signed)
 [facility]  Discharge Instructions: Thank you for choosing [facility] to provide your oncology and hematology care.   If you have a lab appointment with the Cancer Center, please go directly to the Cancer Center and check in at the registration area.   Wear comfortable clothing and clothing appropriate for easy access to any Portacath or PICC line.   We strive to give you quality time with your provider. You may need to reschedule your appointment if you arrive late (15 or more minutes).  Arriving late affects you and other patients whose appointments are after yours.  Also, if you miss three or more appointments without notifying the office, you may be dismissed from the clinic at the provider's discretion.      For prescription refill requests, have your pharmacy contact our office and allow 72 hours for refills to be completed.    Today you received the following chemotherapy and/or immunotherapy agents Keytruda      To help prevent nausea and vomiting after your treatment, we encourage you to take your nausea medication as directed.  BELOW ARE SYMPTOMS THAT SHOULD BE REPORTED IMMEDIATELY: *FEVER GREATER THAN 100.4 F (38 C) OR HIGHER *CHILLS OR SWEATING *NAUSEA AND VOMITING THAT IS NOT CONTROLLED WITH YOUR NAUSEA MEDICATION *UNUSUAL SHORTNESS OF BREATH *UNUSUAL BRUISING OR BLEEDING *URINARY PROBLEMS (pain or burning when urinating, or frequent urination) *BOWEL PROBLEMS (unusual diarrhea, constipation, pain near the anus) TENDERNESS IN MOUTH AND THROAT WITH OR WITHOUT PRESENCE OF ULCERS (sore throat, sores in mouth, or a toothache) UNUSUAL RASH, SWELLING OR PAIN  UNUSUAL VAGINAL DISCHARGE OR ITCHING   Items with * indicate a potential emergency and should be followed up as soon as possible or go to the Emergency Department if any problems should occur.  Please show the CHEMOTHERAPY ALERT CARD or IMMUNOTHERAPY  ALERT CARD at check-in to the Emergency Department and triage nurse.  Should you have questions after your visit or need to cancel or reschedule your appointment, please contact [facility]  Dept: [phone]  and follow the prompts.  Office hours are 8:00 a.m. to 4:30 p.m. Monday - Friday. Please note that voicemails left after 4:00 p.m. may not be returned until the following business day.  We are closed weekends and major holidays. You have access to a nurse at all times for urgent questions. Please call the main number to the clinic Dept: [phone] and follow the prompts.   For any non-urgent questions, you may also contact your provider using MyChart. We now offer e-Visits for anyone 6 and older to request care online for non-urgent symptoms. For details visit [email or web address].   Also download the MyChart app! Go to the app store, search "MyChart", open the app, select [place], and log in with your MyChart username and password.

## 2024-01-31 ENCOUNTER — Other Ambulatory Visit: Payer: Self-pay

## 2024-02-05 ENCOUNTER — Ambulatory Visit: Admitting: Oncology

## 2024-02-05 ENCOUNTER — Other Ambulatory Visit

## 2024-02-05 ENCOUNTER — Ambulatory Visit: Admitting: Nurse Practitioner

## 2024-02-05 ENCOUNTER — Ambulatory Visit

## 2024-02-10 ENCOUNTER — Other Ambulatory Visit: Payer: Self-pay

## 2024-02-17 ENCOUNTER — Other Ambulatory Visit: Payer: Self-pay

## 2024-02-23 ENCOUNTER — Encounter: Payer: Self-pay | Admitting: Nurse Practitioner

## 2024-02-23 ENCOUNTER — Inpatient Hospital Stay (HOSPITAL_BASED_OUTPATIENT_CLINIC_OR_DEPARTMENT_OTHER): Admitting: Nurse Practitioner

## 2024-02-23 ENCOUNTER — Inpatient Hospital Stay

## 2024-02-23 ENCOUNTER — Other Ambulatory Visit (HOSPITAL_BASED_OUTPATIENT_CLINIC_OR_DEPARTMENT_OTHER): Payer: Self-pay

## 2024-02-23 ENCOUNTER — Inpatient Hospital Stay: Attending: Oncology

## 2024-02-23 ENCOUNTER — Other Ambulatory Visit: Payer: Self-pay | Admitting: Oncology

## 2024-02-23 VITALS — BP 138/78 | HR 80 | Temp 97.8°F | Resp 18 | Ht 72.0 in | Wt 236.4 lb

## 2024-02-23 VITALS — BP 130/71 | HR 67 | Temp 97.9°F | Resp 15

## 2024-02-23 DIAGNOSIS — C679 Malignant neoplasm of bladder, unspecified: Secondary | ICD-10-CM | POA: Diagnosis present

## 2024-02-23 DIAGNOSIS — N1330 Unspecified hydronephrosis: Secondary | ICD-10-CM | POA: Diagnosis not present

## 2024-02-23 DIAGNOSIS — D696 Thrombocytopenia, unspecified: Secondary | ICD-10-CM | POA: Diagnosis present

## 2024-02-23 DIAGNOSIS — Z79899 Other long term (current) drug therapy: Secondary | ICD-10-CM | POA: Diagnosis not present

## 2024-02-23 LAB — CMP (CANCER CENTER ONLY)
ALT: 15 U/L (ref 0–44)
AST: 23 U/L (ref 15–41)
Albumin: 3.9 g/dL (ref 3.5–5.0)
Alkaline Phosphatase: 75 U/L (ref 38–126)
Anion gap: 11 (ref 5–15)
BUN: 21 mg/dL (ref 8–23)
CO2: 25 mmol/L (ref 22–32)
Calcium: 9.5 mg/dL (ref 8.9–10.3)
Chloride: 103 mmol/L (ref 98–111)
Creatinine: 1.1 mg/dL (ref 0.61–1.24)
GFR, Estimated: >60 mL/min (ref >60)
Glucose, Bld: 150 mg/dL — ABNORMAL HIGH (ref 70–99)
Potassium: 3.9 mmol/L (ref 3.5–5.1)
Sodium: 139 mmol/L (ref 135–145)
Total Bilirubin: 0.4 mg/dL (ref 0.0–1.2)
Total Protein: 7.3 g/dL (ref 6.5–8.1)

## 2024-02-23 LAB — CBC WITH DIFFERENTIAL (CANCER CENTER ONLY)
Abs Immature Granulocytes: 0.02 K/uL (ref 0.00–0.07)
Basophils Absolute: 0.1 K/uL (ref 0.0–0.1)
Basophils Relative: 1 %
Eosinophils Absolute: 1.2 K/uL — ABNORMAL HIGH (ref 0.0–0.5)
Eosinophils Relative: 15 %
HCT: 38.4 % — ABNORMAL LOW (ref 39.0–52.0)
Hemoglobin: 12.6 g/dL — ABNORMAL LOW (ref 13.0–17.0)
Immature Granulocytes: 0 %
Lymphocytes Relative: 21 %
Lymphs Abs: 1.7 K/uL (ref 0.7–4.0)
MCH: 27.8 pg (ref 26.0–34.0)
MCHC: 32.8 g/dL (ref 30.0–36.0)
MCV: 84.6 fL (ref 80.0–100.0)
Monocytes Absolute: 0.5 K/uL (ref 0.1–1.0)
Monocytes Relative: 7 %
Neutro Abs: 4.3 K/uL (ref 1.7–7.7)
Neutrophils Relative %: 56 %
Platelet Count: 192 K/uL (ref 150–400)
RBC: 4.54 MIL/uL (ref 4.22–5.81)
RDW: 15 % (ref 11.5–15.5)
WBC Count: 7.8 K/uL (ref 4.0–10.5)
nRBC: 0 % (ref 0.0–0.2)

## 2024-02-23 MED ORDER — LIDOCAINE-PRILOCAINE 2.5-2.5 % EX CREA
1.0000 | TOPICAL_CREAM | CUTANEOUS | 2 refills | Status: AC
  Filled 2024-02-23: qty 30, 30d supply, fill #0

## 2024-02-23 MED ORDER — SODIUM CHLORIDE 0.9 % IV SOLN
200.0000 mg | Freq: Once | INTRAVENOUS | Status: AC
  Administered 2024-02-23: 200 mg via INTRAVENOUS
  Filled 2024-02-23: qty 8

## 2024-02-23 MED ORDER — SODIUM CHLORIDE 0.9 % IV SOLN: INTRAVENOUS | Status: DC

## 2024-02-23 NOTE — Progress Notes (Signed)
 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  He completed a cycle of Pembrolizumab  01/29/2024.  No rash or diarrhea.  Overall he feels better.  No nausea or vomiting.  He no longer notes an alteration in taste.  He has a good appetite.  The hyperpigmentation in the bilateral axilla has improved.  He reports having a few episodes of chills about 7 to 10 days after the Pembrolizumab  infusion 01/29/2024.  His wife notes he was sneezing around that time.  He denies fever.  No further episodes.  Objective:  Vital signs in last 24 hours:  Blood pressure 138/78, pulse 80, temperature 97.8 F (36.6 C), temperature source Temporal, resp. rate 18, height 6' (1.829 m), weight 236 lb 6.4 oz (107.2 kg), SpO2 98%.    HEENT: No thrush or ulcers. Lymphatics: Firm fullness medial left inguinal region without a discrete lymph node.  No femoral or right inguinal lymph nodes. Resp: Lungs clear bilaterally. Cardio: Regular rate and rhythm. GI: No hepatosplenomegaly. Vascular: No leg edema.  Skin: Mild hyperpigmentation bilateral axilla. Port-A-Cath without erythema.  Lab Results:  Lab Results  Component Value Date   WBC 7.2 01/29/2024   HGB 13.3 01/29/2024   HCT 38.7 (L) 01/29/2024   MCV 83.4 01/29/2024   PLT 162 01/29/2024   NEUTROABS 3.3 01/29/2024    Imaging:  No results found.  Medications: I have reviewed the patient's current medications.  Assessment/Plan:  Metastatic high-grade urothelial carcinoma Presenting with left leg swelling and weakness November 2021 CT abdomen/pelvis 02/17/2020-left hydronephrosis, retroperitoneal adenopathy, scrotal thickening and edema CT-guided biopsy of left retroperitoneal lymphadenopathy 03/05/2020-poorly differentiated carcinoma consistent with a urothelial primary, PD-L1 combined positive score 100%, tumor mutation burden 26, MSS, ERBB2  amplification Left trigone biopsy 03/21/2020-small foci of infiltrative high-grade urothelial carcinoma with invasion of the muscularis propria and lymphovascular invasion Renal pelvis and left upper tract washing 03/21/2020-high-grade urothelial carcinoma PET 03/21/2020-extensive bilateral retroperitoneal, pelvic, and inguinal hypermetabolic adenopathy, hypermetabolism in the antral region of the stomach with no obvious mass on CT, no evidence of metastatic disease in the neck, chest, or bones Gemcitabine /cisplatin  for 3 cycles beginning 03/27/2020 CT 06/27/2020-marked interval response with some mildly enlarged lymph nodes in the retroperitoneum and pelvis in the range of 1 cm, stranding extending into the base of the penis and suprapubic soft tissue-similar Pembrolizumab  beginning 06/08/2020, changed to every 6-week dosing beginning with cycle 2 CTs 09/18/2021-no evidence of metastatic or recurrent disease in the chest, abdomen, or pelvis, stable 10 mm right pelvic sidewall node mild stranding about the base of the penis PET 05/30/2022-stable compared to 10/18/2021, no evidence of metastatic disease, right pelvic sidewall node described on the 2023 CT is not hypermetabolic Final cycle of pembrolizumab  09/05/2022 PET 12/26/2022-no evidence of recurrent disease PET 08/14/2023-2 enlarging hypermetabolic left inguinal nodes, small retroperitoneal nodes more superiorly are unchanged and are not hypermetabolic Abdomen pelvis 10/30/2023: Increased pathological enlarged left inguinal nodes, new enlarged left external iliac node 11/25/2023: Ultrasound-guided biopsy left inguinal lymph node-metastatic poorly differentiated urothelial carcinoma, p63 and GATA3 positive: NGS testing: TERT, T p53, EEBB2-copy number gain, MSS, tumor mutation burden low to intermediate  11/27/2023 cycle 1 Enfortumab/pembrolizumab , day 8 enfortumab 12/04/2023 12/18/2023 cycle 2 Enfortumab/Pembrolizumab , enfortumab dose reduced due to  neutropenia, day 8 enfortumab 12/25/2023 01/08/2024 cycle 3 enfortumab/Pembrolizumab  01/08/2024, day 8 enfortumab 01/15/2024 01/22/2024 PET: Decreased size of left inguinal lymph nodes-no longer hypermetabolic, 6 mm mildly hypermetabolic left paratracheal node, 7  mm left periaortic node with an SUV below blood pool,, no other evidence of metastatic disease.  Tiny right upper lobe nodule is too small PET resolution, subpleural ground glass in the left upper lobe Pembrolizumab  01/29/2024, 02/23/2024   2.  Status post cholecystectomy 3.  Chronic mild thrombocytopenia following chemotherapy   Disposition: Dr. Mat appears stable.  He continues single agent Pembrolizumab .  He is tolerating well.  Plan to proceed with treatment today as scheduled.  Going forward he would like to receive the 6-week dose.  The Pembrolizumab  dose will be increased when he returns in 3 weeks.  CBC and chemistry panel reviewed.  Labs adequate for treatment.  He will return for follow-up and Pembrolizumab  in 3 weeks.  We are available to see him sooner if needed.    Olam Ned ANP/GNP-BC   02/23/2024  10:35 AM

## 2024-02-23 NOTE — Patient Instructions (Signed)

## 2024-02-23 NOTE — Patient Instructions (Signed)
 [facility]  Discharge Instructions: Thank you for choosing [facility] to provide your oncology and hematology care.   If you have a lab appointment with the Cancer Center, please go directly to the Cancer Center and check in at the registration area.   Wear comfortable clothing and clothing appropriate for easy access to any Portacath or PICC line.   We strive to give you quality time with your provider. You may need to reschedule your appointment if you arrive late (15 or more minutes).  Arriving late affects you and other patients whose appointments are after yours.  Also, if you miss three or more appointments without notifying the office, you may be dismissed from the clinic at the provider's discretion.      For prescription refill requests, have your pharmacy contact our office and allow 72 hours for refills to be completed.    Today you received the following chemotherapy and/or immunotherapy agents Keytruda      To help prevent nausea and vomiting after your treatment, we encourage you to take your nausea medication as directed.  BELOW ARE SYMPTOMS THAT SHOULD BE REPORTED IMMEDIATELY: *FEVER GREATER THAN 100.4 F (38 C) OR HIGHER *CHILLS OR SWEATING *NAUSEA AND VOMITING THAT IS NOT CONTROLLED WITH YOUR NAUSEA MEDICATION *UNUSUAL SHORTNESS OF BREATH *UNUSUAL BRUISING OR BLEEDING *URINARY PROBLEMS (pain or burning when urinating, or frequent urination) *BOWEL PROBLEMS (unusual diarrhea, constipation, pain near the anus) TENDERNESS IN MOUTH AND THROAT WITH OR WITHOUT PRESENCE OF ULCERS (sore throat, sores in mouth, or a toothache) UNUSUAL RASH, SWELLING OR PAIN  UNUSUAL VAGINAL DISCHARGE OR ITCHING   Items with * indicate a potential emergency and should be followed up as soon as possible or go to the Emergency Department if any problems should occur.  Please show the CHEMOTHERAPY ALERT CARD or IMMUNOTHERAPY  ALERT CARD at check-in to the Emergency Department and triage nurse.  Should you have questions after your visit or need to cancel or reschedule your appointment, please contact [facility]  Dept: [phone]  and follow the prompts.  Office hours are 8:00 a.m. to 4:30 p.m. Monday - Friday. Please note that voicemails left after 4:00 p.m. may not be returned until the following business day.  We are closed weekends and major holidays. You have access to a nurse at all times for urgent questions. Please call the main number to the clinic Dept: [phone] and follow the prompts.   For any non-urgent questions, you may also contact your provider using MyChart. We now offer e-Visits for anyone 6 and older to request care online for non-urgent symptoms. For details visit [email or web address].   Also download the MyChart app! Go to the app store, search "MyChart", open the app, select [place], and log in with your MyChart username and password.

## 2024-02-23 NOTE — Progress Notes (Signed)
 Patient seen by Olam Ned NP today  Vitals are within treatment parameters:Yes   Labs are within treatment parameters: Yes   Treatment plan has been signed: Yes   Per physician team, Patient is ready for treatment and there are NO modifications to the treatment plan.

## 2024-02-25 ENCOUNTER — Other Ambulatory Visit: Payer: Self-pay

## 2024-02-28 ENCOUNTER — Other Ambulatory Visit: Payer: Self-pay

## 2024-03-01 ENCOUNTER — Inpatient Hospital Stay

## 2024-03-01 ENCOUNTER — Ambulatory Visit: Admitting: Physician Assistant

## 2024-03-01 ENCOUNTER — Encounter: Payer: Self-pay | Admitting: Physician Assistant

## 2024-03-01 ENCOUNTER — Inpatient Hospital Stay: Admitting: Oncology

## 2024-03-01 VITALS — BP 145/86 | HR 64

## 2024-03-01 DIAGNOSIS — Z1283 Encounter for screening for malignant neoplasm of skin: Secondary | ICD-10-CM

## 2024-03-01 DIAGNOSIS — L821 Other seborrheic keratosis: Secondary | ICD-10-CM

## 2024-03-01 DIAGNOSIS — D229 Melanocytic nevi, unspecified: Secondary | ICD-10-CM

## 2024-03-01 DIAGNOSIS — L578 Other skin changes due to chronic exposure to nonionizing radiation: Secondary | ICD-10-CM

## 2024-03-01 DIAGNOSIS — L814 Other melanin hyperpigmentation: Secondary | ICD-10-CM

## 2024-03-01 DIAGNOSIS — W908XXA Exposure to other nonionizing radiation, initial encounter: Secondary | ICD-10-CM

## 2024-03-01 DIAGNOSIS — D1801 Hemangioma of skin and subcutaneous tissue: Secondary | ICD-10-CM

## 2024-03-01 NOTE — Progress Notes (Signed)
   New Patient Visit   Subjective  Edward [name] is a 63 y.o. male NEW PATIENT who presents for the following:  Total Body Skin Exam (TBSE)  Patient present today for new patient visit for TBSE. The patient denies he  has spots, moles and lesions to be evaluated, some may be new or changing and the patient may have concern these could be cancer. Patient has not previously been treated by a dermatologist. Patient reports he  does not have hx of bx. Patient denies family history of skin cancers. Patient reports throughout his lifetime has had minimal sun exposure. Currently, patient reports if he  has excessive sun exposure, he  does apply sunscreen and/or wears protective coverings.  Patient has spot on scalp that he would like to discuss   Accompanied by wife today  The following portions of the chart were reviewed this encounter and updated as appropriate: medications, allergies, medical history  Review of Systems:  No other skin or systemic complaints except as noted in HPI or Assessment and Plan.  Objective  Well appearing patient in no apparent distress; mood and affect are within normal limits.  A full examination was performed including scalp, head, eyes, ears, nose, lips, neck, chest, axillae, abdomen, back, buttocks, bilateral upper extremities, bilateral lower extremities, hands, feet, fingers, toes, fingernails, and toenails. All findings within normal limits unless otherwise noted below.     Relevant exam findings are noted in the Assessment and Plan.    Assessment & Plan   LENTIGINES, SEBORRHEIC KERATOSES (SCALP/TRUNK), HEMANGIOMAS - Benign normal skin lesions - Benign-appearing - Call for any changes  MELANOCYTIC NEVI - Tan-brown and/or pink-flesh-colored symmetric macules and papules - Benign appearing on exam today - Observation - Call clinic for new or changing moles - Recommend daily use of broad spectrum spf 30+ sunscreen to sun-exposed areas.   ACTINIC  DAMAGE - Chronic condition, secondary to cumulative UV/sun exposure - diffuse scaly erythematous macules with underlying dyspigmentation - Recommend daily broad spectrum sunscreen SPF 30+ to sun-exposed areas, reapply every 2 hours as needed.  - Staying in the shade or wearing long sleeves, sun glasses (UVA+UVB protection) and wide brim hats (4-inch brim around the entire circumference of the hat) are also recommended for sun protection.  - Call for new or changing lesions.  SKIN CANCER SCREENING PERFORMED TODAY SCREENING EXAM FOR SKIN CANCER   SEBORRHEIC KERATOSIS   MULTIPLE BENIGN NEVI   LENTIGINES   CHERRY ANGIOMA   ACTINIC SKIN DAMAGE    Return in about 1 year (around 03/01/2025) for TBSE.  I, Roseline Hutchinson, CMA, am acting as scribe for Zonnique Norkus K, PA-C .  Documentation: I have reviewed the above documentation for accuracy and completeness, and I agree with the above.  Rameen Quinney K, PA-C

## 2024-03-01 NOTE — Patient Instructions (Signed)

## 2024-03-02 ENCOUNTER — Other Ambulatory Visit: Payer: Self-pay

## 2024-03-06 ENCOUNTER — Encounter: Payer: Self-pay | Admitting: Physician Assistant

## 2024-03-15 ENCOUNTER — Inpatient Hospital Stay

## 2024-03-15 ENCOUNTER — Inpatient Hospital Stay: Attending: Oncology

## 2024-03-15 ENCOUNTER — Encounter: Payer: Self-pay | Admitting: Nurse Practitioner

## 2024-03-15 ENCOUNTER — Encounter: Payer: Self-pay | Admitting: Oncology

## 2024-03-15 ENCOUNTER — Inpatient Hospital Stay: Admitting: Nurse Practitioner

## 2024-03-15 VITALS — BP 138/86 | HR 61 | Temp 97.7°F | Resp 17 | Wt 236.3 lb

## 2024-03-15 VITALS — BP 125/65 | HR 66 | Temp 97.9°F | Resp 16

## 2024-03-15 DIAGNOSIS — Z9049 Acquired absence of other specified parts of digestive tract: Secondary | ICD-10-CM | POA: Diagnosis not present

## 2024-03-15 DIAGNOSIS — Z5112 Encounter for antineoplastic immunotherapy: Secondary | ICD-10-CM | POA: Diagnosis present

## 2024-03-15 DIAGNOSIS — Z79899 Other long term (current) drug therapy: Secondary | ICD-10-CM | POA: Diagnosis not present

## 2024-03-15 DIAGNOSIS — C679 Malignant neoplasm of bladder, unspecified: Secondary | ICD-10-CM | POA: Diagnosis present

## 2024-03-15 DIAGNOSIS — D696 Thrombocytopenia, unspecified: Secondary | ICD-10-CM | POA: Diagnosis present

## 2024-03-15 LAB — CMP (CANCER CENTER ONLY)
ALT: 16 U/L (ref 0–44)
AST: 23 U/L (ref 15–41)
Albumin: 4.4 g/dL (ref 3.5–5.0)
Alkaline Phosphatase: 75 U/L (ref 38–126)
Anion gap: 11 (ref 5–15)
BUN: 19 mg/dL (ref 8–23)
CO2: 26 mmol/L (ref 22–32)
Calcium: 9.4 mg/dL (ref 8.9–10.3)
Chloride: 102 mmol/L (ref 98–111)
Creatinine: 1.07 mg/dL (ref 0.61–1.24)
GFR, Estimated: >60 mL/min (ref >60)
Glucose, Bld: 130 mg/dL — ABNORMAL HIGH (ref 70–99)
Potassium: 4.1 mmol/L (ref 3.5–5.1)
Sodium: 138 mmol/L (ref 135–145)
Total Bilirubin: 0.4 mg/dL (ref 0.0–1.2)
Total Protein: 7.4 g/dL (ref 6.5–8.1)

## 2024-03-15 LAB — CBC WITH DIFFERENTIAL (CANCER CENTER ONLY)
Abs Immature Granulocytes: 0.01 K/uL (ref 0.00–0.07)
Basophils Absolute: 0.1 K/uL (ref 0.0–0.1)
Basophils Relative: 1 %
Eosinophils Absolute: 0.7 K/uL — ABNORMAL HIGH (ref 0.0–0.5)
Eosinophils Relative: 9 %
HCT: 40 % (ref 39.0–52.0)
Hemoglobin: 13.6 g/dL (ref 13.0–17.0)
Immature Granulocytes: 0 %
Lymphocytes Relative: 29 %
Lymphs Abs: 2.2 K/uL (ref 0.7–4.0)
MCH: 28.5 pg (ref 26.0–34.0)
MCHC: 34 g/dL (ref 30.0–36.0)
MCV: 83.9 fL (ref 80.0–100.0)
Monocytes Absolute: 0.6 K/uL (ref 0.1–1.0)
Monocytes Relative: 8 %
Neutro Abs: 3.9 K/uL (ref 1.7–7.7)
Neutrophils Relative %: 53 %
Platelet Count: 164 K/uL (ref 150–400)
RBC: 4.77 MIL/uL (ref 4.22–5.81)
RDW: 14.6 % (ref 11.5–15.5)
WBC Count: 7.5 K/uL (ref 4.0–10.5)
nRBC: 0 % (ref 0.0–0.2)

## 2024-03-15 LAB — TSH: TSH: 3.16 u[IU]/mL (ref 0.350–4.500)

## 2024-03-15 MED ORDER — SODIUM CHLORIDE 0.9 % IV SOLN: INTRAVENOUS | Status: DC

## 2024-03-15 MED ORDER — SODIUM CHLORIDE 0.9 % IV SOLN
400.0000 mg | Freq: Once | INTRAVENOUS | Status: AC
  Administered 2024-03-15: 400 mg via INTRAVENOUS
  Filled 2024-03-15: qty 16

## 2024-03-15 NOTE — Patient Instructions (Signed)
 [facility]  Discharge Instructions: Thank you for choosing [facility] to provide your oncology and hematology care.   If you have a lab appointment with the Cancer Center, please go directly to the Cancer Center and check in at the registration area.   Wear comfortable clothing and clothing appropriate for easy access to any Portacath or PICC line.   We strive to give you quality time with your provider. You may need to reschedule your appointment if you arrive late (15 or more minutes).  Arriving late affects you and other patients whose appointments are after yours.  Also, if you miss three or more appointments without notifying the office, you may be dismissed from the clinic at the provider's discretion.      For prescription refill requests, have your pharmacy contact our office and allow 72 hours for refills to be completed.    Today you received the following chemotherapy and/or immunotherapy agents Keytruda      To help prevent nausea and vomiting after your treatment, we encourage you to take your nausea medication as directed.  BELOW ARE SYMPTOMS THAT SHOULD BE REPORTED IMMEDIATELY: *FEVER GREATER THAN 100.4 F (38 C) OR HIGHER *CHILLS OR SWEATING *NAUSEA AND VOMITING THAT IS NOT CONTROLLED WITH YOUR NAUSEA MEDICATION *UNUSUAL SHORTNESS OF BREATH *UNUSUAL BRUISING OR BLEEDING *URINARY PROBLEMS (pain or burning when urinating, or frequent urination) *BOWEL PROBLEMS (unusual diarrhea, constipation, pain near the anus) TENDERNESS IN MOUTH AND THROAT WITH OR WITHOUT PRESENCE OF ULCERS (sore throat, sores in mouth, or a toothache) UNUSUAL RASH, SWELLING OR PAIN  UNUSUAL VAGINAL DISCHARGE OR ITCHING   Items with * indicate a potential emergency and should be followed up as soon as possible or go to the Emergency Department if any problems should occur.  Please show the CHEMOTHERAPY ALERT CARD or IMMUNOTHERAPY  ALERT CARD at check-in to the Emergency Department and triage nurse.  Should you have questions after your visit or need to cancel or reschedule your appointment, please contact [facility]  Dept: [phone]  and follow the prompts.  Office hours are 8:00 a.m. to 4:30 p.m. Monday - Friday. Please note that voicemails left after 4:00 p.m. may not be returned until the following business day.  We are closed weekends and major holidays. You have access to a nurse at all times for urgent questions. Please call the main number to the clinic Dept: [phone] and follow the prompts.   For any non-urgent questions, you may also contact your provider using MyChart. We now offer e-Visits for anyone 6 and older to request care online for non-urgent symptoms. For details visit [email or web address].   Also download the MyChart app! Go to the app store, search "MyChart", open the app, select [place], and log in with your MyChart username and password.

## 2024-03-15 NOTE — Progress Notes (Signed)
 Patient seen by Olam Ned NP today  Vitals are within treatment parameters:Yes   Labs are within treatment parameters: Yes   Treatment plan has been signed: Yes   Per physician team, Patient is ready for treatment and there are NO modifications to the treatment plan.

## 2024-03-15 NOTE — Progress Notes (Signed)
 [facility] OFFICE PROGRESS NOTE   Diagnosis: Urothelial carcinoma  INTERVAL HISTORY:   Dr. Mat returns as scheduled.  Edward [name] completed another cycle of Pembrolizumab  02/23/2024.  No rash.  No diarrhea.  Edward [name] denies nausea/vomiting.  No cough.  No shortness of breath.  Edward [name] has a good appetite.  Weight is stable.  Edward [name] no longer feels enlarged lymph nodes in the right inguinal region.  Objective:  Vital signs in last 24 hours:  Blood pressure 138/86, pulse 61, temperature 97.7 F (36.5 C), temperature source Temporal, resp. rate 17, weight 236 lb 4.8 oz (107.2 kg), SpO2 100%.    HEENT: No thrush or ulcers. Lymphatics: No definite inguinal adenopathy. Resp: Lungs clear bilaterally. Cardio: Regular rate and rhythm. GI: No hepatosplenomegaly. Vascular: No leg edema. Skin: Faint hyperpigmentation bilateral axilla. Port-A-Cath without erythema.  Lab Results:  Lab Results  Component Value Date   WBC 7.5 03/15/2024   HGB 13.6 03/15/2024   HCT 40.0 03/15/2024   MCV 83.9 03/15/2024   PLT 164 03/15/2024   NEUTROABS 3.9 03/15/2024    Imaging:  No results found.  Medications: I have reviewed the patient's current medications.  Assessment/Plan: Metastatic high-grade urothelial carcinoma Presenting with left leg swelling and weakness November 2021 CT abdomen/pelvis 02/17/2020-left hydronephrosis, retroperitoneal adenopathy, scrotal thickening and edema CT-guided biopsy of left retroperitoneal lymphadenopathy 03/05/2020-poorly differentiated carcinoma consistent with a urothelial primary, PD-L1 combined positive score 100%, tumor mutation burden 26, MSS, ERBB2 amplification Left trigone biopsy 03/21/2020-small foci of infiltrative high-grade urothelial carcinoma with invasion of the muscularis propria and lymphovascular invasion Renal pelvis and left upper tract washing 03/21/2020-high-grade urothelial carcinoma PET 03/21/2020-extensive bilateral retroperitoneal,  pelvic, and inguinal hypermetabolic adenopathy, hypermetabolism in the antral region of the stomach with no obvious mass on CT, no evidence of metastatic disease in the neck, chest, or bones Gemcitabine /cisplatin  for 3 cycles beginning 03/27/2020 CT 06/27/2020-marked interval response with some mildly enlarged lymph nodes in the retroperitoneum and pelvis in the range of 1 cm, stranding extending into the base of the penis and suprapubic soft tissue-similar Pembrolizumab  beginning 06/08/2020, changed to every 6-week dosing beginning with cycle 2 CTs 09/18/2021-no evidence of metastatic or recurrent disease in the chest, abdomen, or pelvis, stable 10 mm right pelvic sidewall node mild stranding about the base of the penis PET 05/30/2022-stable compared to 10/18/2021, no evidence of metastatic disease, right pelvic sidewall node described on the 2023 CT is not hypermetabolic Final cycle of pembrolizumab  09/05/2022 PET 12/26/2022-no evidence of recurrent disease PET 08/14/2023-2 enlarging hypermetabolic left inguinal nodes, small retroperitoneal nodes more superiorly are unchanged and are not hypermetabolic Abdomen pelvis 10/30/2023: Increased pathological enlarged left inguinal nodes, new enlarged left external iliac node 11/25/2023: Ultrasound-guided biopsy left inguinal lymph node-metastatic poorly differentiated urothelial carcinoma, p63 and GATA3 positive: NGS testing: TERT, T p53, EEBB2-copy number gain, MSS, tumor mutation burden low to intermediate  11/27/2023 cycle 1 Enfortumab/pembrolizumab , day 8 enfortumab 12/04/2023 12/18/2023 cycle 2 Enfortumab/Pembrolizumab , enfortumab dose reduced due to neutropenia, day 8 enfortumab 12/25/2023 01/08/2024 cycle 3 enfortumab/Pembrolizumab  01/08/2024, day 8 enfortumab 01/15/2024 01/22/2024 PET: Decreased size of left inguinal lymph nodes-no longer hypermetabolic, 6 mm mildly hypermetabolic left paratracheal node, 7 mm left periaortic node with an SUV below blood pool,, no  other evidence of metastatic disease.  Tiny right upper lobe nodule is too small PET resolution, subpleural ground glass in the left upper lobe Pembrolizumab  01/29/2024, 02/23/2024 Pembrolizumab  increased to 6-week dose 03/15/2024   2.  Status post cholecystectomy 3.  Chronic mild thrombocytopenia following  chemotherapy  Disposition: Dr. Mat appears stable.  Edward [name] continues Pembrolizumab .  Edward [name] is tolerating well.  There is no clinical evidence of disease progression.  Plan to proceed with treatment today as scheduled, dose increased to 6-week dose.  Edward [name] agrees with this plan.  CBC and chemistry panel reviewed.  Labs are adequate for treatment.  Edward [name] will return for follow-up and Pembrolizumab  in 6 weeks.  We are available to see him sooner if needed.  Patient seen with Dr. Cloretta.    Edward [name]   03/15/2024  8:22 AM  This was a shared visit with Edward [name].  Edward [name] is interviewed and examined.  Edward [name] is tolerating the pembrolizumab  well.  There is no palpable lymphadenopathy on exam today.  The plan is to continue pembrolizumab .  Arvella Cloretta, MD

## 2024-03-16 LAB — T4: T4, Total: 7.4 ug/dL (ref 4.5–12.0)

## 2024-04-24 ENCOUNTER — Other Ambulatory Visit: Payer: Self-pay | Admitting: Oncology

## 2024-04-26 ENCOUNTER — Inpatient Hospital Stay: Attending: Oncology

## 2024-04-26 ENCOUNTER — Inpatient Hospital Stay

## 2024-04-26 ENCOUNTER — Inpatient Hospital Stay: Admitting: Oncology

## 2024-04-26 VITALS — BP 141/75 | HR 63 | Temp 98.3°F | Resp 18

## 2024-04-26 VITALS — BP 142/89 | HR 63 | Temp 98.1°F | Resp 18 | Ht 72.0 in | Wt 239.0 lb

## 2024-04-26 DIAGNOSIS — C679 Malignant neoplasm of bladder, unspecified: Secondary | ICD-10-CM | POA: Diagnosis not present

## 2024-04-26 DIAGNOSIS — Z5112 Encounter for antineoplastic immunotherapy: Secondary | ICD-10-CM | POA: Insufficient documentation

## 2024-04-26 DIAGNOSIS — Z79899 Other long term (current) drug therapy: Secondary | ICD-10-CM | POA: Insufficient documentation

## 2024-04-26 DIAGNOSIS — D696 Thrombocytopenia, unspecified: Secondary | ICD-10-CM | POA: Diagnosis present

## 2024-04-26 LAB — CMP (CANCER CENTER ONLY)
ALT: 16 U/L (ref 0–44)
AST: 23 U/L (ref 15–41)
Albumin: 4.5 g/dL (ref 3.5–5.0)
Alkaline Phosphatase: 64 U/L (ref 38–126)
Anion gap: 11 (ref 5–15)
BUN: 19 mg/dL (ref 8–23)
CO2: 25 mmol/L (ref 22–32)
Calcium: 9.5 mg/dL (ref 8.9–10.3)
Chloride: 102 mmol/L (ref 98–111)
Creatinine: 1.04 mg/dL (ref 0.61–1.24)
GFR, Estimated: >60 mL/min
Glucose, Bld: 132 mg/dL — ABNORMAL HIGH (ref 70–99)
Potassium: 3.9 mmol/L (ref 3.5–5.1)
Sodium: 138 mmol/L (ref 135–145)
Total Bilirubin: 0.3 mg/dL (ref 0.0–1.2)
Total Protein: 7.4 g/dL (ref 6.5–8.1)

## 2024-04-26 LAB — CBC WITH DIFFERENTIAL (CANCER CENTER ONLY)
Abs Immature Granulocytes: 0.01 K/uL (ref 0.00–0.07)
Basophils Absolute: 0.1 K/uL (ref 0.0–0.1)
Basophils Relative: 1 %
Eosinophils Absolute: 0.6 K/uL — ABNORMAL HIGH (ref 0.0–0.5)
Eosinophils Relative: 9 %
HCT: 41.7 % (ref 39.0–52.0)
Hemoglobin: 14.1 g/dL (ref 13.0–17.0)
Immature Granulocytes: 0 %
Lymphocytes Relative: 40 %
Lymphs Abs: 2.4 K/uL (ref 0.7–4.0)
MCH: 28.3 pg (ref 26.0–34.0)
MCHC: 33.8 g/dL (ref 30.0–36.0)
MCV: 83.7 fL (ref 80.0–100.0)
Monocytes Absolute: 0.6 K/uL (ref 0.1–1.0)
Monocytes Relative: 10 %
Neutro Abs: 2.4 K/uL (ref 1.7–7.7)
Neutrophils Relative %: 40 %
Platelet Count: 136 K/uL — ABNORMAL LOW (ref 150–400)
RBC: 4.98 MIL/uL (ref 4.22–5.81)
RDW: 13.4 % (ref 11.5–15.5)
WBC Count: 5.9 K/uL (ref 4.0–10.5)
nRBC: 0 % (ref 0.0–0.2)

## 2024-04-26 MED ORDER — SODIUM CHLORIDE 0.9% FLUSH: 10.0000 mL | INTRAVENOUS | Status: DC | PRN

## 2024-04-26 MED ORDER — SODIUM CHLORIDE 0.9 % IV SOLN: INTRAVENOUS | Status: DC

## 2024-04-26 MED ORDER — SODIUM CHLORIDE 0.9 % IV SOLN
400.0000 mg | Freq: Once | INTRAVENOUS | Status: AC
  Administered 2024-04-26: 400 mg via INTRAVENOUS
  Filled 2024-04-26: qty 16

## 2024-04-26 NOTE — Patient Instructions (Signed)
 [facility] - A DEPT OF [facility]  Discharge Instructions: Thank you for choosing [facility] to provide your oncology and hematology care.   If you have a lab appointment with the Cancer Center, please go directly to the Cancer Center and check in at the registration area.   Wear comfortable clothing and clothing appropriate for easy access to any Portacath or PICC line.   We strive to give you quality time with your provider. You may need to reschedule your appointment if you arrive late (15 or more minutes).  Arriving late affects you and other patients whose appointments are after yours.  Also, if you miss three or more appointments without notifying the office, you may be dismissed from the clinic at the provider's discretion.      For prescription refill requests, have your pharmacy contact our office and allow 72 hours for refills to be completed.    Today you received the following chemotherapy and/or immunotherapy agents: keytruda       To help prevent nausea and vomiting after your treatment, we encourage you to take your nausea medication as directed.  BELOW ARE SYMPTOMS THAT SHOULD BE REPORTED IMMEDIATELY: *FEVER GREATER THAN 100.4 F (38 C) OR HIGHER *CHILLS OR SWEATING *NAUSEA AND VOMITING THAT IS NOT CONTROLLED WITH YOUR NAUSEA MEDICATION *UNUSUAL SHORTNESS OF BREATH *UNUSUAL BRUISING OR BLEEDING *URINARY PROBLEMS (pain or burning when urinating, or frequent urination) *BOWEL PROBLEMS (unusual diarrhea, constipation, pain near the anus) TENDERNESS IN MOUTH AND THROAT WITH OR WITHOUT PRESENCE OF ULCERS (sore throat, sores in mouth, or a toothache) UNUSUAL RASH, SWELLING OR PAIN  UNUSUAL VAGINAL DISCHARGE OR ITCHING   Items with * indicate a potential emergency and should be followed up as soon as possible or go to the Emergency Department if any problems should occur.  Please show the CHEMOTHERAPY ALERT CARD or IMMUNOTHERAPY  ALERT CARD at check-in to the Emergency Department and triage nurse.  Should you have questions after your visit or need to cancel or reschedule your appointment, please contact [facility] - A DEPT OF [facility]  Dept: [phone]  and follow the prompts.  Office hours are 8:00 a.m. to 4:30 p.m. Monday - Friday. Please note that voicemails left after 4:00 p.m. may not be returned until the following business day.  We are closed weekends and major holidays. You have access to a nurse at all times for urgent questions. Please call the main number to the clinic Dept: [phone] and follow the prompts.   For any non-urgent questions, you may also contact your provider using MyChart. We now offer e-Visits for anyone 38 and older to request care online for non-urgent symptoms. For details visit [email or web address].   Also download the MyChart app! Go to the app store, search MyChart, open the app, select [facility], and log in with your MyChart username and password.

## 2024-04-26 NOTE — Progress Notes (Signed)
 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  No rash or diarrhea.  He feels well.  Altered taste has resolved.  No palpable lymph nodes.  Objective:  Vital signs in last 24 hours:  Blood pressure (!) 142/89, pulse 63, temperature 98.1 F (36.7 C), temperature source Temporal, resp. rate 18, height 6' (1.829 m), weight 239 lb (108.4 kg), SpO2 100%.    Lymphatics: No cervical, supraclavicular, axillary, or femoral nodes.  1-1.5 cm bilateral nodes at the inguinal crease? Resp: Lungs clear bilaterally Cardio: Regular rate and rhythm GI: No hepatosplenomegaly, nontender, no mass Vascular: No leg edema  Portacath/PICC-without erythema  Lab Results:  Lab Results  Component Value Date   WBC 5.9 04/26/2024   HGB 14.1 04/26/2024   HCT 41.7 04/26/2024   MCV 83.7 04/26/2024   PLT 136 (L) 04/26/2024   NEUTROABS 2.4 04/26/2024    CMP  Lab Results  Component Value Date   NA 138 04/26/2024   K 3.9 04/26/2024   CL 102 04/26/2024   CO2 25 04/26/2024   GLUCOSE 132 (H) 04/26/2024   BUN 19 04/26/2024   CREATININE 1.04 04/26/2024   CALCIUM 9.5 04/26/2024   PROT 7.4 04/26/2024   ALBUMIN 4.5 04/26/2024   AST 23 04/26/2024   ALT 16 04/26/2024   ALKPHOS 64 04/26/2024   BILITOT 0.3 04/26/2024   GFRNONAA >60 04/26/2024    No results found for: CEA1, CEA, CAN199, CA125  Lab Results  Component Value Date   INR 1.0 03/05/2020   LABPROT 13.2 03/05/2020    Imaging:  No results found.  Medications: I have reviewed the patient's current medications.   Assessment/Plan: Metastatic high-grade urothelial carcinoma Presenting with left leg swelling and weakness November 2021 CT abdomen/pelvis 02/17/2020-left hydronephrosis, retroperitoneal adenopathy, scrotal thickening and edema CT-guided biopsy of left retroperitoneal lymphadenopathy  03/05/2020-poorly differentiated carcinoma consistent with a urothelial primary, PD-L1 combined positive score 100%, tumor mutation burden 26, MSS, ERBB2 amplification Left trigone biopsy 03/21/2020-small foci of infiltrative high-grade urothelial carcinoma with invasion of the muscularis propria and lymphovascular invasion Renal pelvis and left upper tract washing 03/21/2020-high-grade urothelial carcinoma PET 03/21/2020-extensive bilateral retroperitoneal, pelvic, and inguinal hypermetabolic adenopathy, hypermetabolism in the antral region of the stomach with no obvious mass on CT, no evidence of metastatic disease in the neck, chest, or bones Gemcitabine /cisplatin  for 3 cycles beginning 03/27/2020 CT 06/27/2020-marked interval response with some mildly enlarged lymph nodes in the retroperitoneum and pelvis in the range of 1 cm, stranding extending into the base of the penis and suprapubic soft tissue-similar Pembrolizumab  beginning 06/08/2020, changed to every 6-week dosing beginning with cycle 2 CTs 09/18/2021-no evidence of metastatic or recurrent disease in the chest, abdomen, or pelvis, stable 10 mm right pelvic sidewall node mild stranding about the base of the penis PET 05/30/2022-stable compared to 10/18/2021, no evidence of metastatic disease, right pelvic sidewall node described on the 2023 CT is not hypermetabolic Final cycle of pembrolizumab  09/05/2022 PET 12/26/2022-no evidence of recurrent disease PET 08/14/2023-2 enlarging hypermetabolic left inguinal nodes, small retroperitoneal nodes more superiorly are unchanged and are not hypermetabolic Abdomen pelvis 10/30/2023: Increased pathological enlarged left inguinal nodes, new enlarged left external iliac node 11/25/2023: Ultrasound-guided biopsy left inguinal lymph node-metastatic poorly differentiated urothelial carcinoma, p63 and GATA3 positive: NGS testing: TERT, T p53, EEBB2-copy number gain, MSS, tumor mutation burden low to intermediate   11/27/2023 cycle 1 Enfortumab/pembrolizumab , day 8 enfortumab  12/04/2023 12/18/2023 cycle 2 Enfortumab/Pembrolizumab , enfortumab dose reduced due to neutropenia, day 8 enfortumab 12/25/2023 01/08/2024 cycle 3 enfortumab/Pembrolizumab  01/08/2024, day 8 enfortumab 01/15/2024 01/22/2024 PET: Decreased size of left inguinal lymph nodes-no longer hypermetabolic, 6 mm mildly hypermetabolic left paratracheal node, 7 mm left periaortic node with an SUV below blood pool,, no other evidence of metastatic disease.  Tiny right upper lobe nodule is too small PET resolution, subpleural ground glass in the left upper lobe Pembrolizumab  01/29/2024, 02/23/2024 Pembrolizumab  increased to 6-week dose 03/15/2024   2.  Status post cholecystectomy 3.  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  He will complete another cycle today.  He will return for an office visit and pembrolizumab  in 6 weeks.  He plans a trip to [place] in March.  He will be scheduled for a restaging PET in April or May.  Arley Hof, MD  04/26/2024  9:01 AM   "

## 2024-04-26 NOTE — Progress Notes (Signed)
 Patient seen by Dr. Arley Hof today  Vitals are within treatment parameters:Yes OK to proceed w/BP 142/89  Labs are within treatment parameters: Yes   Treatment plan has been signed: Yes   Per physician team, Patient is ready for treatment and there are NO modifications to the treatment plan.

## 2024-06-07 ENCOUNTER — Inpatient Hospital Stay: Admitting: Oncology

## 2024-06-07 ENCOUNTER — Inpatient Hospital Stay

## 2024-07-19 ENCOUNTER — Inpatient Hospital Stay: Admitting: Oncology

## 2024-07-19 ENCOUNTER — Inpatient Hospital Stay

## 2025-03-07 ENCOUNTER — Ambulatory Visit: Admitting: Physician Assistant
